# Patient Record
Sex: Female | Born: 1935 | Race: White | Hispanic: No | Marital: Married | Smoking: Never smoker
Health system: Southern US, Community
[De-identification: ages and names within clinical notes are randomized; demographics above are authoritative.]

## PROBLEM LIST (undated history)

## (undated) DIAGNOSIS — I2510 Atherosclerotic heart disease of native coronary artery without angina pectoris: Secondary | ICD-10-CM

## (undated) DIAGNOSIS — I219 Acute myocardial infarction, unspecified: Secondary | ICD-10-CM

## (undated) DIAGNOSIS — F0390 Unspecified dementia without behavioral disturbance: Secondary | ICD-10-CM

## (undated) DIAGNOSIS — I10 Essential (primary) hypertension: Secondary | ICD-10-CM

## (undated) HISTORY — PX: CORONARY STENT PLACEMENT: SHX1402

## (undated) HISTORY — PX: CHOLECYSTECTOMY: SHX55

---

## 2009-06-20 ENCOUNTER — Ambulatory Visit: Payer: Self-pay | Admitting: Family Medicine

## 2013-07-18 ENCOUNTER — Ambulatory Visit: Payer: Self-pay | Admitting: Neurology

## 2018-01-09 ENCOUNTER — Ambulatory Visit: Payer: Self-pay | Admitting: Physical Therapy

## 2018-01-17 ENCOUNTER — Ambulatory Visit: Payer: Self-pay | Admitting: Physical Therapy

## 2018-01-18 ENCOUNTER — Ambulatory Visit: Payer: Self-pay | Admitting: Physical Therapy

## 2018-01-23 ENCOUNTER — Ambulatory Visit: Payer: Self-pay | Admitting: Physical Therapy

## 2018-01-26 ENCOUNTER — Ambulatory Visit: Payer: Self-pay | Admitting: Physical Therapy

## 2018-02-06 ENCOUNTER — Ambulatory Visit: Payer: Self-pay | Admitting: Physical Therapy

## 2018-02-09 ENCOUNTER — Ambulatory Visit: Payer: Self-pay | Admitting: Physical Therapy

## 2018-02-13 ENCOUNTER — Ambulatory Visit: Payer: Self-pay | Admitting: Physical Therapy

## 2018-02-16 ENCOUNTER — Ambulatory Visit: Payer: Self-pay | Admitting: Physical Therapy

## 2019-05-14 ENCOUNTER — Ambulatory Visit: Payer: Self-pay | Admitting: Podiatry

## 2019-05-17 ENCOUNTER — Other Ambulatory Visit: Payer: Self-pay

## 2019-05-17 ENCOUNTER — Encounter: Payer: Self-pay | Admitting: Podiatry

## 2019-05-17 ENCOUNTER — Ambulatory Visit (INDEPENDENT_AMBULATORY_CARE_PROVIDER_SITE_OTHER): Payer: Medicare PPO | Admitting: Podiatry

## 2019-05-17 DIAGNOSIS — M79675 Pain in left toe(s): Secondary | ICD-10-CM

## 2019-05-17 DIAGNOSIS — B351 Tinea unguium: Secondary | ICD-10-CM | POA: Diagnosis not present

## 2019-05-17 DIAGNOSIS — D689 Coagulation defect, unspecified: Secondary | ICD-10-CM | POA: Insufficient documentation

## 2019-05-17 DIAGNOSIS — M79674 Pain in right toe(s): Secondary | ICD-10-CM

## 2019-05-17 NOTE — Progress Notes (Signed)
This patient presents to my office for at risk foot care.  This patient requires this care by a professional since this patient will be at risk due to having coagulation defect for which she takes eliquiss. This patient presents to the office with her daughter in law.  This patient is unable to cut nails herself since the patient cannot reach her nails.These nails are painful walking and wearing shoes.  This patient presents for at risk foot care today.  General Appearance  Alert, conversant and in no acute stress.  Vascular  Dorsalis pedis and posterior tibial  pulses are palpable  bilaterally.  Capillary return is within normal limits  bilaterally. Temperature is within normal limits  bilaterally.  Neurologic  Deferred due to dementia.  Nails Thick disfigured discolored nails with subungual debris  from hallux to fifth toes bilaterally. No evidence of bacterial infection or drainage bilaterally.  Orthopedic  No limitations of motion  feet .  No crepitus or effusions noted.  No bony pathology or digital deformities noted.  Skin  normotropic skin with no porokeratosis noted bilaterally.  No signs of infections or ulcers noted.     Onychomycosis  Pain in right toes  Pain in left toes  Consent was obtained for treatment procedures.   Mechanical debridement of nails 1-5  bilaterally performed with a nail nipper.  Filed with dremel without incident.    Return office visit    3 months                  Told patient to return for periodic foot care and evaluation due to potential at risk complications.   Helane Gunther DPM

## 2019-07-21 ENCOUNTER — Other Ambulatory Visit: Payer: Self-pay

## 2019-07-21 ENCOUNTER — Emergency Department: Payer: Medicare PPO

## 2019-07-21 DIAGNOSIS — I482 Chronic atrial fibrillation, unspecified: Secondary | ICD-10-CM | POA: Diagnosis present

## 2019-07-21 DIAGNOSIS — E876 Hypokalemia: Secondary | ICD-10-CM | POA: Diagnosis not present

## 2019-07-21 DIAGNOSIS — E871 Hypo-osmolality and hyponatremia: Secondary | ICD-10-CM | POA: Diagnosis present

## 2019-07-21 DIAGNOSIS — B9689 Other specified bacterial agents as the cause of diseases classified elsewhere: Secondary | ICD-10-CM | POA: Diagnosis present

## 2019-07-21 DIAGNOSIS — Z888 Allergy status to other drugs, medicaments and biological substances status: Secondary | ICD-10-CM

## 2019-07-21 DIAGNOSIS — Z9049 Acquired absence of other specified parts of digestive tract: Secondary | ICD-10-CM

## 2019-07-21 DIAGNOSIS — Z79899 Other long term (current) drug therapy: Secondary | ICD-10-CM

## 2019-07-21 DIAGNOSIS — F0150 Vascular dementia without behavioral disturbance: Secondary | ICD-10-CM | POA: Diagnosis present

## 2019-07-21 DIAGNOSIS — Z955 Presence of coronary angioplasty implant and graft: Secondary | ICD-10-CM

## 2019-07-21 DIAGNOSIS — I2510 Atherosclerotic heart disease of native coronary artery without angina pectoris: Secondary | ICD-10-CM | POA: Diagnosis present

## 2019-07-21 DIAGNOSIS — Z6830 Body mass index (BMI) 30.0-30.9, adult: Secondary | ICD-10-CM

## 2019-07-21 DIAGNOSIS — N390 Urinary tract infection, site not specified: Secondary | ICD-10-CM | POA: Diagnosis present

## 2019-07-21 DIAGNOSIS — Z91040 Latex allergy status: Secondary | ICD-10-CM

## 2019-07-21 DIAGNOSIS — E860 Dehydration: Secondary | ICD-10-CM | POA: Diagnosis present

## 2019-07-21 DIAGNOSIS — R6520 Severe sepsis without septic shock: Secondary | ICD-10-CM | POA: Diagnosis present

## 2019-07-21 DIAGNOSIS — I248 Other forms of acute ischemic heart disease: Secondary | ICD-10-CM | POA: Diagnosis present

## 2019-07-21 DIAGNOSIS — K449 Diaphragmatic hernia without obstruction or gangrene: Secondary | ICD-10-CM | POA: Diagnosis present

## 2019-07-21 DIAGNOSIS — R630 Anorexia: Secondary | ICD-10-CM | POA: Diagnosis present

## 2019-07-21 DIAGNOSIS — Z20822 Contact with and (suspected) exposure to covid-19: Secondary | ICD-10-CM | POA: Diagnosis present

## 2019-07-21 DIAGNOSIS — Z23 Encounter for immunization: Secondary | ICD-10-CM

## 2019-07-21 DIAGNOSIS — Z885 Allergy status to narcotic agent status: Secondary | ICD-10-CM

## 2019-07-21 DIAGNOSIS — I5033 Acute on chronic diastolic (congestive) heart failure: Secondary | ICD-10-CM | POA: Diagnosis present

## 2019-07-21 DIAGNOSIS — G309 Alzheimer's disease, unspecified: Secondary | ICD-10-CM | POA: Diagnosis present

## 2019-07-21 DIAGNOSIS — E663 Overweight: Secondary | ICD-10-CM | POA: Diagnosis present

## 2019-07-21 DIAGNOSIS — I252 Old myocardial infarction: Secondary | ICD-10-CM

## 2019-07-21 DIAGNOSIS — G20 Parkinson's disease: Secondary | ICD-10-CM | POA: Diagnosis present

## 2019-07-21 DIAGNOSIS — E785 Hyperlipidemia, unspecified: Secondary | ICD-10-CM | POA: Diagnosis present

## 2019-07-21 DIAGNOSIS — I080 Rheumatic disorders of both mitral and aortic valves: Secondary | ICD-10-CM | POA: Diagnosis present

## 2019-07-21 DIAGNOSIS — I447 Left bundle-branch block, unspecified: Secondary | ICD-10-CM | POA: Diagnosis present

## 2019-07-21 DIAGNOSIS — Z9119 Patient's noncompliance with other medical treatment and regimen: Secondary | ICD-10-CM

## 2019-07-21 DIAGNOSIS — E878 Other disorders of electrolyte and fluid balance, not elsewhere classified: Secondary | ICD-10-CM | POA: Diagnosis present

## 2019-07-21 DIAGNOSIS — I110 Hypertensive heart disease with heart failure: Secondary | ICD-10-CM | POA: Diagnosis present

## 2019-07-21 DIAGNOSIS — A4151 Sepsis due to Escherichia coli [E. coli]: Secondary | ICD-10-CM | POA: Diagnosis not present

## 2019-07-21 DIAGNOSIS — G9341 Metabolic encephalopathy: Secondary | ICD-10-CM | POA: Diagnosis present

## 2019-07-21 DIAGNOSIS — D500 Iron deficiency anemia secondary to blood loss (chronic): Secondary | ICD-10-CM | POA: Diagnosis present

## 2019-07-21 DIAGNOSIS — F0280 Dementia in other diseases classified elsewhere without behavioral disturbance: Secondary | ICD-10-CM | POA: Diagnosis present

## 2019-07-21 DIAGNOSIS — N179 Acute kidney failure, unspecified: Secondary | ICD-10-CM | POA: Diagnosis present

## 2019-07-21 DIAGNOSIS — Z7901 Long term (current) use of anticoagulants: Secondary | ICD-10-CM

## 2019-07-21 DIAGNOSIS — I700 Atherosclerosis of aorta: Secondary | ICD-10-CM | POA: Diagnosis present

## 2019-07-21 DIAGNOSIS — D689 Coagulation defect, unspecified: Secondary | ICD-10-CM | POA: Diagnosis present

## 2019-07-21 LAB — COMPREHENSIVE METABOLIC PANEL
ALT: 28 U/L (ref 0–44)
AST: 38 U/L (ref 15–41)
Albumin: 3.4 g/dL — ABNORMAL LOW (ref 3.5–5.0)
Alkaline Phosphatase: 126 U/L (ref 38–126)
Anion gap: 13 (ref 5–15)
BUN: 18 mg/dL (ref 8–23)
CO2: 23 mmol/L (ref 22–32)
Calcium: 8.7 mg/dL — ABNORMAL LOW (ref 8.9–10.3)
Chloride: 94 mmol/L — ABNORMAL LOW (ref 98–111)
Creatinine, Ser: 1.14 mg/dL — ABNORMAL HIGH (ref 0.44–1.00)
GFR calc Af Amer: 50 mL/min — ABNORMAL LOW (ref >60)
GFR calc non Af Amer: 44 mL/min — ABNORMAL LOW (ref >60)
Glucose, Bld: 223 mg/dL — ABNORMAL HIGH (ref 70–99)
Potassium: 2.8 mmol/L — ABNORMAL LOW (ref 3.5–5.1)
Sodium: 130 mmol/L — ABNORMAL LOW (ref 135–145)
Total Bilirubin: 1.2 mg/dL (ref 0.3–1.2)
Total Protein: 7.3 g/dL (ref 6.5–8.1)

## 2019-07-21 LAB — CBC WITH DIFFERENTIAL/PLATELET
Abs Immature Granulocytes: 0.19 10*3/uL — ABNORMAL HIGH (ref 0.00–0.07)
Basophils Absolute: 0.1 10*3/uL (ref 0.0–0.1)
Basophils Relative: 0 %
Eosinophils Absolute: 0 10*3/uL (ref 0.0–0.5)
Eosinophils Relative: 0 %
HCT: 40.6 % (ref 36.0–46.0)
Hemoglobin: 13.8 g/dL (ref 12.0–15.0)
Immature Granulocytes: 1 %
Lymphocytes Relative: 2 %
Lymphs Abs: 0.4 10*3/uL — ABNORMAL LOW (ref 0.7–4.0)
MCH: 31.2 pg (ref 26.0–34.0)
MCHC: 34 g/dL (ref 30.0–36.0)
MCV: 91.9 fL (ref 80.0–100.0)
Monocytes Absolute: 0.9 10*3/uL (ref 0.1–1.0)
Monocytes Relative: 4 %
Neutro Abs: 23.3 10*3/uL — ABNORMAL HIGH (ref 1.7–7.7)
Neutrophils Relative %: 93 %
Platelets: 157 10*3/uL (ref 150–400)
RBC: 4.42 MIL/uL (ref 3.87–5.11)
RDW: 13.9 % (ref 11.5–15.5)
Smear Review: NORMAL
WBC: 25.1 10*3/uL — ABNORMAL HIGH (ref 4.0–10.5)
nRBC: 0 % (ref 0.0–0.2)

## 2019-07-21 LAB — LACTIC ACID, PLASMA
Lactic Acid, Venous: 3.8 mmol/L (ref 0.5–1.9)
Lactic Acid, Venous: 3.7 mmol/L (ref 0.5–1.9)

## 2019-07-21 LAB — TROPONIN I (HIGH SENSITIVITY): Troponin I (High Sensitivity): 60 ng/L — ABNORMAL HIGH (ref <18)

## 2019-07-21 MED ORDER — SODIUM CHLORIDE 0.9 % IV BOLUS
1000.0000 mL | Freq: Once | INTRAVENOUS | Status: AC
  Administered 2019-07-21: 1000 mL via INTRAVENOUS

## 2019-07-21 NOTE — ED Triage Notes (Signed)
Patient coming in POV from home with son for multiple complaints. Patient has hx of dementia. Patient has not been eating, increased weakness, increased confusion beginning yesterday.

## 2019-07-22 ENCOUNTER — Inpatient Hospital Stay
Admission: EM | Admit: 2019-07-22 | Discharge: 2019-08-10 | DRG: 871 | Disposition: A | Discharge status: Skilled Nursing Facility | Payer: Medicare PPO | Attending: Internal Medicine | Admitting: Internal Medicine

## 2019-07-22 ENCOUNTER — Inpatient Hospital Stay: Payer: Medicare PPO

## 2019-07-22 ENCOUNTER — Encounter: Payer: Self-pay | Admitting: Family Medicine

## 2019-07-22 ENCOUNTER — Emergency Department: Payer: Medicare PPO

## 2019-07-22 DIAGNOSIS — N390 Urinary tract infection, site not specified: Secondary | ICD-10-CM | POA: Diagnosis present

## 2019-07-22 DIAGNOSIS — E876 Hypokalemia: Secondary | ICD-10-CM

## 2019-07-22 DIAGNOSIS — I248 Other forms of acute ischemic heart disease: Secondary | ICD-10-CM

## 2019-07-22 DIAGNOSIS — G20 Parkinson's disease: Secondary | ICD-10-CM | POA: Diagnosis not present

## 2019-07-22 DIAGNOSIS — I2510 Atherosclerotic heart disease of native coronary artery without angina pectoris: Secondary | ICD-10-CM | POA: Diagnosis present

## 2019-07-22 DIAGNOSIS — N179 Acute kidney failure, unspecified: Secondary | ICD-10-CM

## 2019-07-22 DIAGNOSIS — D649 Anemia, unspecified: Secondary | ICD-10-CM | POA: Diagnosis not present

## 2019-07-22 DIAGNOSIS — G309 Alzheimer's disease, unspecified: Secondary | ICD-10-CM | POA: Diagnosis present

## 2019-07-22 DIAGNOSIS — I110 Hypertensive heart disease with heart failure: Secondary | ICD-10-CM | POA: Diagnosis present

## 2019-07-22 DIAGNOSIS — I5033 Acute on chronic diastolic (congestive) heart failure: Secondary | ICD-10-CM | POA: Diagnosis present

## 2019-07-22 DIAGNOSIS — A4150 Gram-negative sepsis, unspecified: Secondary | ICD-10-CM | POA: Diagnosis present

## 2019-07-22 DIAGNOSIS — Z20822 Contact with and (suspected) exposure to covid-19: Secondary | ICD-10-CM | POA: Diagnosis present

## 2019-07-22 DIAGNOSIS — D62 Acute posthemorrhagic anemia: Secondary | ICD-10-CM | POA: Diagnosis not present

## 2019-07-22 DIAGNOSIS — D500 Iron deficiency anemia secondary to blood loss (chronic): Secondary | ICD-10-CM | POA: Diagnosis present

## 2019-07-22 DIAGNOSIS — A4151 Sepsis due to Escherichia coli [E. coli]: Secondary | ICD-10-CM | POA: Diagnosis present

## 2019-07-22 DIAGNOSIS — I4891 Unspecified atrial fibrillation: Secondary | ICD-10-CM | POA: Diagnosis not present

## 2019-07-22 DIAGNOSIS — D689 Coagulation defect, unspecified: Secondary | ICD-10-CM | POA: Diagnosis present

## 2019-07-22 DIAGNOSIS — G9341 Metabolic encephalopathy: Secondary | ICD-10-CM | POA: Diagnosis present

## 2019-07-22 DIAGNOSIS — I5043 Acute on chronic combined systolic (congestive) and diastolic (congestive) heart failure: Secondary | ICD-10-CM | POA: Diagnosis not present

## 2019-07-22 DIAGNOSIS — E860 Dehydration: Secondary | ICD-10-CM | POA: Diagnosis present

## 2019-07-22 DIAGNOSIS — R0600 Dyspnea, unspecified: Secondary | ICD-10-CM | POA: Diagnosis not present

## 2019-07-22 DIAGNOSIS — I10 Essential (primary) hypertension: Secondary | ICD-10-CM | POA: Diagnosis present

## 2019-07-22 DIAGNOSIS — F0280 Dementia in other diseases classified elsewhere without behavioral disturbance: Secondary | ICD-10-CM | POA: Diagnosis present

## 2019-07-22 DIAGNOSIS — R531 Weakness: Secondary | ICD-10-CM | POA: Diagnosis not present

## 2019-07-22 DIAGNOSIS — E871 Hypo-osmolality and hyponatremia: Secondary | ICD-10-CM

## 2019-07-22 DIAGNOSIS — R6520 Severe sepsis without septic shock: Secondary | ICD-10-CM | POA: Diagnosis not present

## 2019-07-22 DIAGNOSIS — B9689 Other specified bacterial agents as the cause of diseases classified elsewhere: Secondary | ICD-10-CM | POA: Diagnosis present

## 2019-07-22 DIAGNOSIS — R0602 Shortness of breath: Secondary | ICD-10-CM | POA: Diagnosis not present

## 2019-07-22 DIAGNOSIS — G20A1 Parkinson's disease without dyskinesia, without mention of fluctuations: Secondary | ICD-10-CM | POA: Diagnosis present

## 2019-07-22 DIAGNOSIS — K449 Diaphragmatic hernia without obstruction or gangrene: Secondary | ICD-10-CM | POA: Diagnosis present

## 2019-07-22 DIAGNOSIS — E785 Hyperlipidemia, unspecified: Secondary | ICD-10-CM | POA: Diagnosis present

## 2019-07-22 DIAGNOSIS — I2489 Other forms of acute ischemic heart disease: Secondary | ICD-10-CM

## 2019-07-22 DIAGNOSIS — B9620 Unspecified Escherichia coli [E. coli] as the cause of diseases classified elsewhere: Secondary | ICD-10-CM | POA: Diagnosis not present

## 2019-07-22 DIAGNOSIS — D638 Anemia in other chronic diseases classified elsewhere: Secondary | ICD-10-CM | POA: Diagnosis not present

## 2019-07-22 DIAGNOSIS — A419 Sepsis, unspecified organism: Secondary | ICD-10-CM

## 2019-07-22 DIAGNOSIS — E878 Other disorders of electrolyte and fluid balance, not elsewhere classified: Secondary | ICD-10-CM | POA: Diagnosis present

## 2019-07-22 DIAGNOSIS — Z23 Encounter for immunization: Secondary | ICD-10-CM | POA: Diagnosis present

## 2019-07-22 DIAGNOSIS — D509 Iron deficiency anemia, unspecified: Secondary | ICD-10-CM | POA: Diagnosis not present

## 2019-07-22 DIAGNOSIS — I482 Chronic atrial fibrillation, unspecified: Secondary | ICD-10-CM | POA: Diagnosis present

## 2019-07-22 DIAGNOSIS — R059 Cough, unspecified: Secondary | ICD-10-CM

## 2019-07-22 HISTORY — DX: Acute myocardial infarction, unspecified: I21.9

## 2019-07-22 HISTORY — DX: Essential (primary) hypertension: I10

## 2019-07-22 HISTORY — DX: Atherosclerotic heart disease of native coronary artery without angina pectoris: I25.10

## 2019-07-22 HISTORY — DX: Unspecified dementia, unspecified severity, without behavioral disturbance, psychotic disturbance, mood disturbance, and anxiety: F03.90

## 2019-07-22 LAB — URINALYSIS, COMPLETE (UACMP) WITH MICROSCOPIC
Bilirubin Urine: NEGATIVE
Glucose, UA: NEGATIVE mg/dL
Ketones, ur: NEGATIVE mg/dL
Nitrite: POSITIVE — AB
Protein, ur: NEGATIVE mg/dL
Specific Gravity, Urine: 1.012 (ref 1.005–1.030)
pH: 5 (ref 5.0–8.0)

## 2019-07-22 LAB — LACTIC ACID, PLASMA
Lactic Acid, Venous: 2.2 mmol/L (ref 0.5–1.9)
Lactic Acid, Venous: 2.1 mmol/L (ref 0.5–1.9)

## 2019-07-22 LAB — CBC WITH DIFFERENTIAL/PLATELET
Abs Immature Granulocytes: 0.06 10*3/uL (ref 0.00–0.07)
Basophils Absolute: 0.1 10*3/uL (ref 0.0–0.1)
Basophils Relative: 0 %
Eosinophils Absolute: 0.1 10*3/uL (ref 0.0–0.5)
Eosinophils Relative: 0 %
HCT: 35.9 % — ABNORMAL LOW (ref 36.0–46.0)
Hemoglobin: 12.3 g/dL (ref 12.0–15.0)
Immature Granulocytes: 0 %
Lymphocytes Relative: 6 %
Lymphs Abs: 0.9 10*3/uL (ref 0.7–4.0)
MCH: 30.6 pg (ref 26.0–34.0)
MCHC: 34.3 g/dL (ref 30.0–36.0)
MCV: 89.3 fL (ref 80.0–100.0)
Monocytes Absolute: 0.4 10*3/uL (ref 0.1–1.0)
Monocytes Relative: 3 %
Neutro Abs: 14.2 10*3/uL — ABNORMAL HIGH (ref 1.7–7.7)
Neutrophils Relative %: 91 %
Platelets: 134 10*3/uL — ABNORMAL LOW (ref 150–400)
RBC: 4.02 MIL/uL (ref 3.87–5.11)
RDW: 13.9 % (ref 11.5–15.5)
WBC: 15.7 10*3/uL — ABNORMAL HIGH (ref 4.0–10.5)
nRBC: 0 % (ref 0.0–0.2)

## 2019-07-22 LAB — SARS CORONAVIRUS 2 BY RT PCR (HOSPITAL ORDER, PERFORMED IN ~~LOC~~ HOSPITAL LAB): SARS Coronavirus 2: NEGATIVE

## 2019-07-22 LAB — BASIC METABOLIC PANEL
Anion gap: 11 (ref 5–15)
Anion gap: 8 (ref 5–15)
BUN: 18 mg/dL (ref 8–23)
BUN: 17 mg/dL (ref 8–23)
CO2: 24 mmol/L (ref 22–32)
CO2: 25 mmol/L (ref 22–32)
Calcium: 8.2 mg/dL — ABNORMAL LOW (ref 8.9–10.3)
Calcium: 8.3 mg/dL — ABNORMAL LOW (ref 8.9–10.3)
Chloride: 99 mmol/L (ref 98–111)
Chloride: 101 mmol/L (ref 98–111)
Creatinine, Ser: 0.92 mg/dL (ref 0.44–1.00)
Creatinine, Ser: 0.79 mg/dL (ref 0.44–1.00)
GFR calc Af Amer: >60 mL/min (ref >60)
GFR calc Af Amer: >60 mL/min (ref >60)
GFR calc non Af Amer: 56 mL/min — ABNORMAL LOW (ref >60)
GFR calc non Af Amer: >60 mL/min (ref >60)
Glucose, Bld: 119 mg/dL — ABNORMAL HIGH (ref 70–99)
Glucose, Bld: 121 mg/dL — ABNORMAL HIGH (ref 70–99)
Potassium: 3.4 mmol/L — ABNORMAL LOW (ref 3.5–5.1)
Potassium: 3.5 mmol/L (ref 3.5–5.1)
Sodium: 134 mmol/L — ABNORMAL LOW (ref 135–145)
Sodium: 134 mmol/L — ABNORMAL LOW (ref 135–145)

## 2019-07-22 LAB — BLOOD GAS, ARTERIAL
Acid-Base Excess: 1.4 mmol/L (ref 0.0–2.0)
Bicarbonate: 24.7 mmol/L (ref 20.0–28.0)
O2 Saturation: 97 %
Patient temperature: 37
pCO2 arterial: 34 mmHg (ref 32.0–48.0)
pH, Arterial: 7.47 — ABNORMAL HIGH (ref 7.350–7.450)
pO2, Arterial: 85 mmHg (ref 83.0–108.0)

## 2019-07-22 LAB — PROCALCITONIN
Procalcitonin: 1.21 ng/mL
Procalcitonin: 1.11 ng/mL
Procalcitonin: 0.95 ng/mL

## 2019-07-22 LAB — MAGNESIUM
Magnesium: 1.7 mg/dL (ref 1.7–2.4)
Magnesium: 1.6 mg/dL — ABNORMAL LOW (ref 1.7–2.4)

## 2019-07-22 LAB — PROTIME-INR
INR: 1.6 — ABNORMAL HIGH (ref 0.8–1.2)
INR: 1.5 — ABNORMAL HIGH (ref 0.8–1.2)
Prothrombin Time: 18.6 seconds — ABNORMAL HIGH (ref 11.4–15.2)
Prothrombin Time: 17.5 seconds — ABNORMAL HIGH (ref 11.4–15.2)

## 2019-07-22 LAB — TROPONIN I (HIGH SENSITIVITY): Troponin I (High Sensitivity): 64 ng/L — ABNORMAL HIGH (ref <18)

## 2019-07-22 LAB — CORTISOL-AM, BLOOD: Cortisol - AM: 20.9 ug/dL (ref 6.7–22.6)

## 2019-07-22 MED ORDER — ONDANSETRON HCL 4 MG PO TABS: 4.0000 mg | ORAL_TABLET | Freq: Four times a day (QID) | ORAL | Status: DC | PRN

## 2019-07-22 MED ORDER — PNEUMOCOCCAL VAC POLYVALENT 25 MCG/0.5ML IJ INJ
0.5000 mL | INJECTION | INTRAMUSCULAR | Status: AC
  Administered 2019-07-25: 0.5 mL via INTRAMUSCULAR
  Filled 2019-07-22: qty 0.5

## 2019-07-22 MED ORDER — POTASSIUM CHLORIDE 20 MEQ PO PACK
40.0000 meq | PACK | Freq: Once | ORAL | Status: DC
  Filled 2019-07-22 (×2): qty 2

## 2019-07-22 MED ORDER — TRAZODONE HCL 50 MG PO TABS
25.0000 mg | ORAL_TABLET | Freq: Every evening | ORAL | Status: DC | PRN
  Administered 2019-07-22 – 2019-08-09 (×18): 25 mg via ORAL
  Filled 2019-07-22 (×17): qty 1

## 2019-07-22 MED ORDER — NITROGLYCERIN 0.4 MG SL SUBL: 0.4000 mg | SUBLINGUAL_TABLET | SUBLINGUAL | Status: DC | PRN

## 2019-07-22 MED ORDER — DONEPEZIL HCL 5 MG PO TABS
10.0000 mg | ORAL_TABLET | Freq: Every day | ORAL | Status: DC
  Administered 2019-07-22 – 2019-08-09 (×19): 10 mg via ORAL
  Filled 2019-07-22 (×20): qty 2

## 2019-07-22 MED ORDER — ADULT MULTIVITAMIN W/MINERALS CH
1.0000 | ORAL_TABLET | Freq: Every day | ORAL | Status: DC
  Administered 2019-07-22 – 2019-08-10 (×20): 1 via ORAL
  Filled 2019-07-22 (×21): qty 1

## 2019-07-22 MED ORDER — SODIUM CHLORIDE 0.9 % IV SOLN
1.0000 g | Freq: Once | INTRAVENOUS | Status: AC
  Administered 2019-07-22: 1 g via INTRAVENOUS
  Filled 2019-07-22: qty 1

## 2019-07-22 MED ORDER — LACTATED RINGERS IV BOLUS: 2000.0000 mL | Freq: Once | INTRAVENOUS | Status: DC

## 2019-07-22 MED ORDER — POTASSIUM CHLORIDE CRYS ER 20 MEQ PO TBCR
40.0000 meq | EXTENDED_RELEASE_TABLET | Freq: Once | ORAL | Status: AC
  Administered 2019-07-22: 40 meq via ORAL
  Filled 2019-07-22: qty 2

## 2019-07-22 MED ORDER — ACETAMINOPHEN 325 MG PO TABS
650.0000 mg | ORAL_TABLET | Freq: Four times a day (QID) | ORAL | Status: DC | PRN
  Administered 2019-07-24 – 2019-07-25 (×2): 650 mg via ORAL
  Filled 2019-07-22 (×2): qty 2

## 2019-07-22 MED ORDER — ATORVASTATIN CALCIUM 80 MG PO TABS
80.0000 mg | ORAL_TABLET | Freq: Every day | ORAL | Status: DC
  Administered 2019-07-22 – 2019-08-10 (×20): 80 mg via ORAL
  Filled 2019-07-22 (×20): qty 1

## 2019-07-22 MED ORDER — POTASSIUM CHLORIDE 20 MEQ PO PACK
40.0000 meq | PACK | Freq: Once | ORAL | Status: AC
  Administered 2019-07-22: 40 meq via ORAL

## 2019-07-22 MED ORDER — POTASSIUM CHLORIDE CRYS ER 20 MEQ PO TBCR
20.0000 meq | EXTENDED_RELEASE_TABLET | Freq: Every day | ORAL | Status: DC
  Administered 2019-07-22 – 2019-08-03 (×13): 20 meq via ORAL
  Filled 2019-07-22 (×13): qty 1

## 2019-07-22 MED ORDER — MAGNESIUM SULFATE 50 % IJ SOLN
3.0000 g | Freq: Once | INTRAVENOUS | Status: AC
  Administered 2019-07-22: 3 g via INTRAVENOUS
  Filled 2019-07-22: qty 4

## 2019-07-22 MED ORDER — LACTATED RINGERS IV BOLUS
1000.0000 mL | Freq: Once | INTRAVENOUS | Status: AC
  Administered 2019-07-22: 1000 mL via INTRAVENOUS

## 2019-07-22 MED ORDER — VANCOMYCIN HCL IN DEXTROSE 1-5 GM/200ML-% IV SOLN
1000.0000 mg | Freq: Once | INTRAVENOUS | Status: AC
  Administered 2019-07-22: 1000 mg via INTRAVENOUS
  Filled 2019-07-22: qty 200

## 2019-07-22 MED ORDER — FERROUS SULFATE 325 (65 FE) MG PO TABS
325.0000 mg | ORAL_TABLET | Freq: Every day | ORAL | Status: DC
  Administered 2019-07-22 – 2019-07-28 (×7): 325 mg via ORAL
  Filled 2019-07-22 (×7): qty 1

## 2019-07-22 MED ORDER — SERTRALINE HCL 50 MG PO TABS
50.0000 mg | ORAL_TABLET | Freq: Every day | ORAL | Status: DC
  Administered 2019-07-22: 50 mg via ORAL
  Filled 2019-07-22: qty 1

## 2019-07-22 MED ORDER — MAGNESIUM HYDROXIDE 400 MG/5ML PO SUSP
30.0000 mL | Freq: Every day | ORAL | Status: DC | PRN
  Filled 2019-07-22: qty 30

## 2019-07-22 MED ORDER — POTASSIUM CHLORIDE 10 MEQ/100ML IV SOLN
10.0000 meq | INTRAVENOUS | Status: AC
  Administered 2019-07-22: 10 meq via INTRAVENOUS
  Filled 2019-07-22: qty 100

## 2019-07-22 MED ORDER — SODIUM CHLORIDE 0.9 % IV SOLN: INTRAVENOUS | Status: DC

## 2019-07-22 MED ORDER — ONDANSETRON HCL 4 MG/2ML IJ SOLN
4.0000 mg | Freq: Four times a day (QID) | INTRAMUSCULAR | Status: DC | PRN
  Administered 2019-08-02: 4 mg via INTRAVENOUS
  Filled 2019-07-22: qty 2

## 2019-07-22 MED ORDER — POTASSIUM CHLORIDE 10 MEQ/100ML IV SOLN: 10.0000 meq | INTRAVENOUS | Status: DC

## 2019-07-22 MED ORDER — MEGESTROL ACETATE 400 MG/10ML PO SUSP
400.0000 mg | Freq: Every day | ORAL | Status: DC
  Administered 2019-07-22 – 2019-08-10 (×19): 400 mg via ORAL
  Filled 2019-07-22 (×20): qty 10

## 2019-07-22 MED ORDER — MEMANTINE HCL 10 MG PO TABS
10.0000 mg | ORAL_TABLET | Freq: Every day | ORAL | Status: DC
  Administered 2019-07-22 – 2019-08-10 (×20): 10 mg via ORAL
  Filled 2019-07-22 (×20): qty 1

## 2019-07-22 MED ORDER — ACETAMINOPHEN 650 MG RE SUPP: 650.0000 mg | Freq: Four times a day (QID) | RECTAL | Status: DC | PRN

## 2019-07-22 MED ORDER — APIXABAN 5 MG PO TABS
5.0000 mg | ORAL_TABLET | Freq: Two times a day (BID) | ORAL | Status: DC
  Administered 2019-07-22 – 2019-07-26 (×10): 5 mg via ORAL
  Filled 2019-07-22 (×10): qty 1

## 2019-07-22 MED ORDER — ACETAMINOPHEN 500 MG PO TABS
1000.0000 mg | ORAL_TABLET | Freq: Once | ORAL | Status: AC
  Administered 2019-07-22: 1000 mg via ORAL
  Filled 2019-07-22: qty 2

## 2019-07-22 MED ORDER — DILTIAZEM HCL ER COATED BEADS 120 MG PO CP24
240.0000 mg | ORAL_CAPSULE | Freq: Every day | ORAL | Status: DC
  Administered 2019-07-22 – 2019-08-10 (×20): 240 mg via ORAL
  Filled 2019-07-22 (×2): qty 1
  Filled 2019-07-22: qty 2
  Filled 2019-07-22: qty 1
  Filled 2019-07-22: qty 2
  Filled 2019-07-22: qty 1
  Filled 2019-07-22 (×2): qty 2
  Filled 2019-07-22 (×2): qty 1
  Filled 2019-07-22 (×5): qty 2
  Filled 2019-07-22: qty 1
  Filled 2019-07-22 (×4): qty 2
  Filled 2019-07-22: qty 1
  Filled 2019-07-22: qty 2
  Filled 2019-07-22: qty 1
  Filled 2019-07-22 (×4): qty 2
  Filled 2019-07-22: qty 1
  Filled 2019-07-22: qty 2

## 2019-07-22 MED ORDER — LACTATED RINGERS IV BOLUS: 1000.0000 mL | Freq: Once | INTRAVENOUS | Status: DC

## 2019-07-22 MED ORDER — SODIUM CHLORIDE 0.9 % IV SOLN
1.0000 g | INTRAVENOUS | Status: DC
  Administered 2019-07-22 – 2019-07-25 (×4): 1 g via INTRAVENOUS
  Filled 2019-07-22: qty 1
  Filled 2019-07-22: qty 10
  Filled 2019-07-22: qty 1
  Filled 2019-07-22: qty 10
  Filled 2019-07-22: qty 1

## 2019-07-22 NOTE — Progress Notes (Signed)
CODE SEPSIS - PHARMACY COMMUNICATION  **Broad Spectrum Antibiotics should be administered within 1 hour of Sepsis diagnosis**  Time Code Sepsis Called/Page Received: 0039  Antibiotics Ordered: Cefepime  Time of 1st antibiotic administration: 0109  Additional action taken by pharmacy: n/a  If necessary, Name of Provider/Nurse Contacted: n/a    Wayland Denis ,PharmD Clinical Pharmacist  07/22/2019  1:34 AM

## 2019-07-22 NOTE — Progress Notes (Addendum)
Patient has removed her PIV, despite it having been wrapped on previous shift. E. Ouma notified; order placed to trial telesitter as pt appears to be redirectable.   Patient was also given trazadone at 9:30 pm.   0100 Update: Patient awoke after about 2 hours extremely confused and forgetful. Telesitter is  ineffective due to patient not being able to hear well enough to understand the telesitter, and also pt does not understand the concept. She is afraid of the voices and not seeing a person. 1:1 safety sitter placed at bedside and covering APP, E. Ouma notified. Patient also has a new IV, IVF restarted.

## 2019-07-22 NOTE — ED Provider Notes (Addendum)
[facility] Emergency Department Provider Note  ____________________________________________  Time seen: Approximately 12:59 AM  I have reviewed the triage vital signs and the nursing notes.   HISTORY  Chief Complaint Altered Mental Status  Level 5 caveat:  Portions of the history and physical were unable to be obtained due to dementia   HPI Kimberly Wang is a 86 y.o. female history of dementia, CAD, hypertension who presents for evaluation of altered mental status. Patient is from home with her husband. She lives independently. Over the last 2 days has not been eating or drinking well, has been more confused than baseline. Son thinks that she might have vomited earlier today. Patient has no complaints and denies fatigue, fever, chest pain, shortness of breath, abdominal pain, nausea, vomiting, diarrhea, dysuria or hematuria. His son thinks that her abdomen might be more distended than normal.   Past Medical History:  Diagnosis Date  . Coronary artery disease   . Dementia (HCC)   . Hypertension   . Myocardial infarct [facility])     Patient Active Problem List   Diagnosis Date Noted  . Pain due to onychomycosis of toenails of both feet 05/17/2019  . Coagulation defect (HCC) 05/17/2019    Past Surgical History:  Procedure Laterality Date  . CHOLECYSTECTOMY    . CORONARY STENT PLACEMENT      Prior to Admission medications   Medication Sig Start Date End Date Taking? Authorizing Provider  atorvastatin (LIPITOR) 80 MG tablet Take by mouth. 11/21/18   [provider]  diltiazem (CARDIZEM CD) 240 MG 24 hr capsule  03/28/19   [provider]  donepezil (ARICEPT) 10 MG tablet  03/28/19   [provider]  ELIQUIS 5 MG TABS tablet  04/15/19   [provider]  FEROSUL 325 (65 Fe) MG tablet  03/28/19   [provider]  furosemide (LASIX) 80 MG tablet  03/28/19   [provider]  memantine (NAMENDA) 10 MG tablet  03/28/19    [provider]  Multiple Vitamin (MULTIVITAMIN) capsule Take by mouth.    [provider]  nitroGLYCERIN (NITROSTAT) 0.4 MG SL tablet Place under the tongue. 11/21/18   [provider]  potassium chloride SA (KLOR-CON) 20 MEQ tablet Take by mouth. 11/21/18   [provider]  sertraline (ZOLOFT) 50 MG tablet  04/15/19   [provider]  spironolactone (ALDACTONE) 25 MG tablet Take by mouth. 11/21/18   [provider]    Allergies Latex, Lidocaine hcl, Codeine, Morphine, Novocain [procaine], and Other  No family history on file.  Social History Social History   Tobacco Use  . Smoking status: Never Smoker  . Smokeless tobacco: Never Used  Substance Use Topics  . Alcohol use: Never  . Drug use: Never    Review of Systems  Constitutional: + fever, confusion Eyes: Negative for visual changes. ENT: Negative for sore throat. Neck: No neck pain  Cardiovascular: Negative for chest pain. Respiratory: Negative for shortness of breath. Gastrointestinal: Negative for abdominal pain, vomiting or diarrhea. Genitourinary: Negative for dysuria. Musculoskeletal: Negative for back pain. Skin: Negative for rash. Neurological: Negative for headaches, weakness or numbness. Psych: No SI or HI  ____________________________________________   PHYSICAL EXAM:  VITAL SIGNS: ED Triage Vitals  Enc Vitals Group     BP 07/21/19 2135 129/75     Pulse Rate 07/21/19 2135 (!) 101     Resp 07/21/19 2135 19     Temp 07/21/19 2135 100.2 F (37.9 C)  Temp Source 07/21/19 2135 Oral     SpO2 07/21/19 2135 94 %     Weight 07/21/19 2136 160 lb (72.6 kg)     Height 07/21/19 2136 5\' 4"  (1.626 m)     Head Circumference --      Peak Flow --      Pain Score 07/21/19 2136 0     Pain Loc --      Pain Edu? --      Excl. in [place]? --     Constitutional: Alert and oriented x2. Well appearing and in no apparent distress. HEENT:      Head: Normocephalic and  atraumatic.         Eyes: Conjunctivae are normal. Sclera is non-icteric.       Mouth/Throat: Mucous membranes are moist.       Neck: Supple with no signs of meningismus. Cardiovascular: Regular rate and rhythm. No murmurs, gallops, or rubs.  Respiratory: Normal respiratory effort. Lungs are clear to auscultation bilaterally. No wheezes, crackles, or rhonchi.  Gastrointestinal: Soft,mild diffuse tenderness, and non distended with positive bowel sounds. No rebound or guarding. Genitourinary: No CVA tenderness. Musculoskeletal: No edema, cyanosis, or erythema of extremities. Neurologic: Normal speech and language. Face is symmetric. Moving all extremities. No gross focal neurologic deficits are appreciated. Skin: Skin is warm, dry and intact. No rash noted. Psychiatric: Mood and affect are normal. Speech and behavior are normal.  ____________________________________________   LABS (all labs ordered are listed, but only abnormal results are displayed)  Labs Reviewed  COMPREHENSIVE METABOLIC PANEL - Abnormal; Notable for the following components:      Result Value   Sodium 130 (*)    Potassium 2.8 (*)    Chloride 94 (*)    Glucose, Bld 223 (*)    Creatinine, Ser 1.14 (*)    Calcium 8.7 (*)    Albumin 3.4 (*)    GFR calc non Af Amer 44 (*)    GFR calc Af Amer 50 (*)    All other components within normal limits  LACTIC ACID, PLASMA - Abnormal; Notable for the following components:   Lactic Acid, Venous 3.8 (*)    All other components within normal limits  LACTIC ACID, PLASMA - Abnormal; Notable for the following components:   Lactic Acid, Venous 3.7 (*)    All other components within normal limits  CBC WITH DIFFERENTIAL/PLATELET - Abnormal; Notable for the following components:   WBC 25.1 (*)    Neutro Abs 23.3 (*)    Lymphs Abs 0.4 (*)    Abs Immature Granulocytes 0.19 (*)    All other components within normal limits  URINALYSIS, COMPLETE (UACMP) WITH MICROSCOPIC - Abnormal;  Notable for the following components:   Color, Urine YELLOW (*)    APPearance HAZY (*)    Hgb urine dipstick MODERATE (*)    Nitrite POSITIVE (*)    Leukocytes,Ua TRACE (*)    Bacteria, UA MANY (*)    All other components within normal limits  TROPONIN I (HIGH SENSITIVITY) - Abnormal; Notable for the following components:   Troponin I (High Sensitivity) 60 (*)    All other components within normal limits  CULTURE, BLOOD (ROUTINE X 2)  CULTURE, BLOOD (ROUTINE X 2)  SARS CORONAVIRUS 2 BY RT PCR (HOSPITAL ORDER, [facility])  PROTIME-INR  MAGNESIUM  PROCALCITONIN  TROPONIN I (HIGH SENSITIVITY)   ____________________________________________  EKG  ED ECG REPORT I, Rudene Re, the attending physician, personally viewed and  interpreted this ECG.  Atrial fibrillation with ventricular rate of 106, left bundle branch block, normal QTC, normal axis, no ST elevations. Left bundle branch block is new when compared to prior from 2011. ____________________________________________  RADIOLOGY  I have personally reviewed the images performed during this visit and I agree with the Radiologist's read.   Interpretation by Radiologist:  CT ABDOMEN PELVIS WO CONTRAST  Result Date: 07/22/2019 CLINICAL DATA:  Sepsis EXAM: CT ABDOMEN AND PELVIS WITHOUT CONTRAST TECHNIQUE: Multidetector CT imaging of the abdomen and pelvis was performed following the standard protocol without IV contrast. COMPARISON:  None. FINDINGS: Lower chest: There is mild cardiomegaly. Mitral valve calcifications. A moderate paraesophageal hernia containing contrast is seen. Hepatobiliary: Although limited due to the lack of intravenous contrast, normal in appearance without gross focal abnormality. The patient is status post cholecystectomy. No biliary ductal dilation. Pancreas:  Unremarkable.  No surrounding inflammatory changes. Spleen: Normal in size. Although limited due to the lack of intravenous  contrast, normal in appearance. Adrenals/Urinary Tract: Both adrenal glands appear normal. The kidneys and collecting system appear normal without evidence of urinary tract calculus or hydronephrosis. Bladder is unremarkable. Stomach/Bowel: The stomach, small bowel, are normal in appearance. There is scattered colonic diverticula without diverticulitis. Vascular/Lymphatic: There are no enlarged abdominal or pelvic lymph nodes. Scattered aortic atherosclerotic calcifications are seen without aneurysmal dilatation. Reproductive: The uterus and adnexa are unremarkable. Other: A small fat and transverse colon containing umbilical hernia seen within the mid abdomen. Musculoskeletal: No acute or significant osseous findings. IMPRESSION: Moderate paraesophageal hernia. Diverticulosis without diverticulitis. No other acute intra-abdominal or pelvic pathology to explain the patient's symptoms. Aortic Atherosclerosis (ICD10-I70.0). Electronically Signed   By: Jonna Clark M.D.   On: 07/22/2019 01:40   DG Chest 1 View  Result Date: 07/21/2019 CLINICAL DATA:  Sepsis. EXAM: CHEST  1 VIEW COMPARISON:  None. FINDINGS: There are small bilateral pleural effusions. There is a left perihilar density of unknown clinical significance. The heart size is mildly enlarged. There are dense aortic calcifications. There are few streaky airspace opacities at the lung bases favored to represent areas of atelectasis. There is no pneumothorax. IMPRESSION: 1. Small bilateral pleural effusions. 2. Mild cardiomegaly. 3. Slightly prominent left perihilar region may be secondary to a dilated pulmonary artery or lymphadenopathy. A follow-up two-view chest x-ray is recommended in 4-6 weeks. Electronically Signed   By: Katherine Mantle M.D.   On: 07/21/2019 22:34     ____________________________________________   PROCEDURES  Procedure(s) performed:yes .1-3 Lead EKG Interpretation Performed by: Nita Sickle, MD Authorized by:  Nita Sickle, MD     Interpretation: abnormal     Rhythm: atrial fibrillation     Ectopy: none     Conduction: abnormal (LBBB)     Critical Care performed: yes  CRITICAL CARE Performed by: Nita Sickle  ?  Total critical care time: 45 min  Critical care time was exclusive of separately billable procedures and treating other patients.  Critical care was necessary to treat or prevent imminent or life-threatening deterioration.  Critical care was time spent personally by me on the following activities: development of treatment plan with patient and/or surrogate as well as nursing, discussions with consultants, evaluation of patient's response to treatment, examination of patient, obtaining history from patient or surrogate, ordering and performing treatments and interventions, ordering and review of laboratory studies, ordering and review of radiographic studies, pulse oximetry and re-evaluation of patient's condition.  ____________________________________________   INITIAL IMPRESSION / ASSESSMENT AND PLAN / ED COURSE  86 y.o. female history of dementia, CAD, hypertension who presents for evaluation of altered mental status from home with son. Patient meets sepsis criteria on arrival with a pulse of 101, temp of 100.70F, no true localizing symptoms on exam, she is otherwise well-appearing with no meningeal signs, abdomen slightly tender diffusely with no rebound or guarding. Chest x-ray reviewed showing small bilateral pleural effusions and mild cardiomegaly, confirmed by radiology. EKG showing new left bundle branch block currently on A. fib with well controlled rate. Left bundle is new but A. fib is not. Labs showing white count of 25 with left shift, lactic acid of 3.7, labs showing AKI with creatinine of 1.14, hyper glycemia with no signs of DKA, hypokalemia and mild hyponatremia. Patient was given a liter of normal saline in the waiting room with no significant changes in her  lactic acid. With pleural effusions and cardiomegaly we will continue to give a hydration but cautiously. We will give another fluid bolus. Troponin is elevated at 60 most likely demand ischemia as patient is not complaining of any chest pain. UA and CT abdomen pelvis are pending. Broad-spectrum antibiotics initiated. Cultures are pending. History is gathered from patient and her son was at bedside. Old medical records reviewed. Patient placed on telemetry for close monitoring. Anticipate admission.  _________________________ 1:44 AM on 07/22/2019 -----------------------------------------  UA positive for UTI with presentation concerning for urosepsis.  CT abdomen pelvis reviewed and no acute pathology, confirmed by radiology.  Hospitalist paged for admission.  Broad-spectrum antibiotics given.    _____________________________________________ Please note:  Patient was evaluated in Emergency Department today for the symptoms described in the history of present illness. Patient was evaluated in the context of the global COVID-19 pandemic, which necessitated consideration that the patient might be at risk for infection with the SARS-CoV-2 virus that causes COVID-19. Institutional protocols and algorithms that pertain to the evaluation of patients at risk for COVID-19 are in a state of rapid change based on information released by regulatory bodies including the CDC and federal and state organizations. These policies and algorithms were followed during the patient's care in the ED.  Some ED evaluations and interventions may be delayed as a result of limited staffing during the pandemic.   [place] Controlled Substance Database was reviewed by me. ____________________________________________   FINAL CLINICAL IMPRESSION(S) / ED DIAGNOSES   Final diagnoses:  Sepsis due to urinary tract infection (HCC)  AKI (acute kidney injury) (HCC)  Hypokalemia  Hyponatremia  Demand ischemia (HCC)      NEW  MEDICATIONS STARTED DURING THIS VISIT:  ED Discharge Orders    None       Note:  This document was prepared using Dragon voice recognition software and may include unintentional dictation errors.    Don Perking, [place], MD 07/22/19 0144    Nita Sickle, MD 07/22/19 [phone]

## 2019-07-22 NOTE — Progress Notes (Addendum)
PROGRESS NOTE    Kimberly Wang  CBS:[id number] DOB: 12-09-1930 DOA: 07/22/2019 PCP: [name], MD     Brief Narrative:  Kimberly Wang  is a 56 WF PMHx Dementia, CAD, HTN, MI, coagulation defect  who presented to the emergency room with acute onset of altered mental status with generalized weakness and confusion and significant diminish p.o. intake especially today.  She vomited once.  She was having low-grade fever without chills.  She denied any abdominal pain.  No significant dysuria, hematuria or urgency or frequency or flank pain.  No cough or wheezing or dyspnea.  Upon presentation to the emergency room, temperature was 100.2 and heart rate 101 with otherwise normal vital signs.  CMP revealed hyponatremia and hypokalemia as well as hypochloremia with blood glucose of 223, BUN of 18 and creatinine 1.14, albumin of 3.4.  Lactic acid was 3.8 and later 3.7.  High-sensitivity troponin I was 60.  CBC showed leukocytosis of 25.1 with neutrophilia.  Urinalysis showed 6-10 WBCs with positive nitrite and trace leukocytes.  Blood cultures were drawn and urine culture was sent.  Abdominal and pelvic CT scan revealed paraesophageal hernia with no acute findings.  Chest x-ray showed bilateral small pleural effusions with mild cardiomegaly.  The patient was given 1 g of p.o. Tylenol, a gram of IV cefepime and a gram of IV vancomycin, 1 L of lactated Ringer, 10 medical IV potassium chloride and 40 mEq p.o. as well as 1 L IV normal saline.  She will be admitted to the progressive unit bed for further evaluation and management.   Subjective: A/O x1 (does not know where, when, why).  Pleasantly confused.  Follows commands.  Negative CP, negative S OB.   Assessment & Plan:   Active Problems:   Coagulation defect (HCC)   Sepsis due to gram-negative UTI (HCC)   Severe sepsis (HCC)   Dementia due to Parkinson's disease without behavioral disturbance (HCC)   Benign essential HTN   CAD (coronary  artery disease)  Severe sepsis/UTI -HR> 90, WBC> 12, lactic acid> 2 -Complete 5-day course of antibiotics -Normal saline 59ml/hr -Cultures pending  Dementia/Acute Metabolic encephalopathy. -Multifactorial to include severe sepsis, hyponatremia.  Son present able to give accurate baseline -son states currently near baseline. -Neurochecks q 4 hrs -Aricept 10 mg daily -Memantine 10 mg daily -Minimize sedating medication  Hyponatremia -Multifactorial volume depletion, SSRI, anorexia. -Correct underlying factors. -See severe sepsis -Zoloft 50 mg daily (hold) -Megace 400 mg daily ,  Elevated troponin high-sensitivity/demand ischemia Results for Kimberly, Wang (MRN [id number]) as of 07/22/2019 14:05  Ref. Range 07/21/2019 22:51 07/22/2019 00:38  Troponin I (High Sensitivity) Latest Ref Range: <18 ng/L 60 (H) 64 (H)  -Not consistent with ACS.  Most consistent with demand ischemia.  Essential HTN -Cardizem 240 mg daily  Dyslipidemia -Lipitor 80 mg daily -Lipid panel pending   Coagulation defect -Apixaban 5 mg BID  Hypokalemia -Potassium level> 4 -K-Dur 40 mEq  Hypomagnesmia -Potassium goal> 2 -Magnesium IV 3 g    DVT prophylaxis: Apixaban Code Status: Full Family Communication: 5/30 son present at bedside for discussion of plan of care all questions answered Disposition Plan:  Status is: Inpatient    Dispo: The patient is from: SNF              Anticipated d/c is to: SNF              Anticipated d/c date is: 7 June  Patient currently unstable      Consultants:  None   Procedures/Significant Events:  5/29 CXR;-small bilateral pleural effusions. -. Mild cardiomegaly. -Slightly prominent left perihilar region may be secondary to a dilated pulmonary artery or lymphadenopathy 5/30 CT abdomen pelvis W0 contrast; diverticulosis without diverticulitis.  Moderate paraesophageal hernia     I have personally reviewed and interpreted all  radiology studies and my findings are as above.  VENTILATOR SETTINGS:    Cultures 5/29 blood LEFT AC NGTD 5/29 blood RIGHT arm NGTD  Antimicrobials: Anti-infectives (From admission, onward)   Start     Dose/Rate Stop   07/22/19 0900  cefTRIAXone (ROCEPHIN) 1 g in sodium chloride 0.9 % 100 mL IVPB     1 g 200 mL/hr over 30 Minutes     07/22/19 0145  vancomycin (VANCOCIN) IVPB 1000 mg/200 mL premix     1,000 mg 200 mL/hr over 60 Minutes 07/22/19 0349   07/22/19 0045  ceFEPIme (MAXIPIME) 1 g in sodium chloride 0.9 % 100 mL IVPB     1 g 200 mL/hr over 30 Minutes 07/22/19 0202       Devices    LINES / TUBES:      Continuous Infusions: . sodium chloride 100 mL/hr at 07/22/19 0431  . cefTRIAXone (ROCEPHIN)  IV 1 g (07/22/19 1027)     Objective: Vitals:   07/22/19 0355 07/22/19 0528 07/22/19 0740 07/22/19 1050  BP: 119/86 (!) 121/92 122/89 (!) 142/72  Pulse: (!) 105 (!) 101 91 73  Resp: 20  20 20   Temp: 98.3 F (36.8 C) 98.7 F (37.1 C) (!) 97.5 F (36.4 C) (!) 97.4 F (36.3 C)  TempSrc: Oral Oral Oral Oral  SpO2: 97% 100% 98% 99%  Weight:      Height:        Intake/Output Summary (Last 24 hours) at 07/22/2019 1425 Last data filed at 07/22/2019 1200 Gross per 24 hour  Intake 3021.85 ml  Output --  Net 3021.85 ml   Filed Weights   07/21/19 2136  Weight: 72.6 kg    Examination:  General: A/O x1 (does not know where, when, why), no acute respiratory distress Eyes: negative scleral hemorrhage, negative anisocoria, negative icterus ENT: Negative Runny nose, negative gingival bleeding, Neck:  Negative scars, masses, torticollis, lymphadenopathy, JVD Lungs: Clear to auscultation bilaterally without wheezes or crackles Cardiovascular: Regular rate and rhythm without murmur gallop or rub normal S1 and S2 Abdomen: negative abdominal pain, nondistended, positive soft, bowel sounds, no rebound, no ascites, no appreciable mass Extremities: No significant  cyanosis, clubbing, or edema bilateral lower extremities Skin: Negative rashes, lesions, ulcers Psychiatric:  Negative depression, negative anxiety, negative fatigue, negative mania  Central nervous system:  Cranial nerves II through XII intact, tongue/uvula midline, all extremities muscle strength 5/5, sensation intact throughout,  negative dysarthria, negative expressive aphasia, negative receptive aphasia.  .     Data Reviewed: Care during the described time interval was provided by me .  I have reviewed this patient's available data, including medical history, events of note, physical examination, and all test results as part of my evaluation.  CBC: Recent Labs  Lab 07/21/19 2251 07/22/19 1214  WBC 25.1* 15.7*  NEUTROABS 23.3* 14.2*  HGB 13.8 12.3  HCT 40.6 35.9*  MCV 91.9 89.3  PLT 157 134*   Basic Metabolic Panel: Recent Labs  Lab 07/21/19 2251 07/22/19 0038 07/22/19 0646 07/22/19 1214  NA 130*  --  134* 134*  K 2.8*  --  3.4* 3.5  CL 94*  --  99 101  CO2 23  --  24 25  GLUCOSE 223*  --  119* 121*  BUN 18  --  18 17  CREATININE 1.14*  --  0.92 0.79  CALCIUM 8.7*  --  8.2* 8.3*  MG  --  1.7  --  1.6*   GFR: Estimated Creatinine Clearance: 49.3 mL/min (by C-G formula based on SCr of 0.79 mg/dL). Liver Function Tests: Recent Labs  Lab 07/21/19 2251  AST 38  ALT 28  ALKPHOS 126  BILITOT 1.2  PROT 7.3  ALBUMIN 3.4*   No results for input(s): LIPASE, AMYLASE in the last 168 hours. No results for input(s): AMMONIA in the last 168 hours. Coagulation Profile: Recent Labs  Lab 07/21/19 2208 07/22/19 0646  INR 1.6* 1.5*   Cardiac Enzymes: No results for input(s): CKTOTAL, CKMB, CKMBINDEX, TROPONINI in the last 168 hours. BNP (last 3 results) No results for input(s): PROBNP in the last 8760 hours. HbA1C: No results for input(s): HGBA1C in the last 72 hours. CBG: No results for input(s): GLUCAP in the last 168 hours. Lipid Profile: No results for  input(s): CHOL, HDL, LDLCALC, TRIG, CHOLHDL, LDLDIRECT in the last 72 hours. Thyroid Function Tests: No results for input(s): TSH, T4TOTAL, FREET4, T3FREE, THYROIDAB in the last 72 hours. Anemia Panel: No results for input(s): VITAMINB12, FOLATE, FERRITIN, TIBC, IRON, RETICCTPCT in the last 72 hours. Sepsis Labs: Recent Labs  Lab 07/21/19 2207 07/21/19 2251 07/22/19 0211 07/22/19 0646 07/22/19 1214  PROCALCITON  --   --  1.21 1.11 0.95  LATICACIDVEN 3.8* 3.7*  --   --  2.2*    Recent Results (from the past 240 hour(s))  Culture, blood (Routine x 2)     Status: None (Preliminary result)   Collection Time: 07/21/19 10:07 PM   Specimen: BLOOD  Result Value Ref Range Status   Specimen Description BLOOD LEFT ANTECUBITAL  Final   Special Requests   Final    BOTTLES DRAWN AEROBIC AND ANAEROBIC Blood Culture results may not be optimal due to an excessive volume of blood received in culture bottles   Culture   Final    NO GROWTH < 12 HOURS Performed at [facility], [address] [place], [place] [address]    Report Status PENDING  Incomplete  Culture, blood (Routine x 2)     Status: None (Preliminary result)   Collection Time: 07/21/19 10:51 PM   Specimen: BLOOD  Result Value Ref Range Status   Specimen Description BLOOD BLOOD RIGHT ARM  Final   Special Requests   Final    BOTTLES DRAWN AEROBIC AND ANAEROBIC Blood Culture adequate volume   Culture   Final    NO GROWTH < 12 HOURS Performed at [facility], [address] [place], [place] [address]    Report Status PENDING  Incomplete  SARS Coronavirus 2 by RT PCR (hospital order, performed in [facility] hospital lab) Nasopharyngeal Nasopharyngeal Swab     Status: None   Collection Time: 07/22/19 12:34 AM   Specimen: Nasopharyngeal Swab  Result Value Ref Range Status   SARS Coronavirus 2 NEGATIVE NEGATIVE Final    Comment: (NOTE) SARS-CoV-2 target nucleic acids are NOT DETECTED. The SARS-CoV-2 RNA is  generally detectable in upper and lower respiratory specimens during the acute phase of infection. The lowest concentration of SARS-CoV-2 viral copies this assay can detect is 250 copies / mL. A negative result does not preclude SARS-CoV-2 infection and should not  be used as the sole basis for treatment or other patient management decisions.  A negative result may occur with improper specimen collection / handling, submission of specimen other than nasopharyngeal swab, presence of viral mutation(s) within the areas targeted by this assay, and inadequate number of viral copies (<250 copies / mL). A negative result must be combined with clinical observations, patient history, and epidemiological information. Fact Sheet for Patients:   BoilerBrush.com.cy Fact Sheet for Healthcare Providers: https://pope.com/ This test is not yet approved or cleared  by the [place] FDA and has been authorized for detection and/or diagnosis of SARS-CoV-2 by FDA under an Emergency Use Authorization (EUA).  This EUA will remain in effect (meaning this test can be used) for the duration of the COVID-19 declaration under Section 564(b)(1) of the Act, 21 U.S.C. section 360bbb-3(b)(1), unless the authorization is terminated or revoked sooner. Performed at [facility], [address] [place], [place] [address]          Radiology Studies: CT ABDOMEN PELVIS WO CONTRAST  Result Date: 07/22/2019 CLINICAL DATA:  Sepsis EXAM: CT ABDOMEN AND PELVIS WITHOUT CONTRAST TECHNIQUE: Multidetector CT imaging of the abdomen and pelvis was performed following the standard protocol without IV contrast. COMPARISON:  None. FINDINGS: Lower chest: There is mild cardiomegaly. Mitral valve calcifications. A moderate paraesophageal hernia containing contrast is seen. Hepatobiliary: Although limited due to the lack of intravenous contrast, normal in appearance without  gross focal abnormality. The patient is status post cholecystectomy. No biliary ductal dilation. Pancreas:  Unremarkable.  No surrounding inflammatory changes. Spleen: Normal in size. Although limited due to the lack of intravenous contrast, normal in appearance. Adrenals/Urinary Tract: Both adrenal glands appear normal. The kidneys and collecting system appear normal without evidence of urinary tract calculus or hydronephrosis. Bladder is unremarkable. Stomach/Bowel: The stomach, small bowel, are normal in appearance. There is scattered colonic diverticula without diverticulitis. Vascular/Lymphatic: There are no enlarged abdominal or pelvic lymph nodes. Scattered aortic atherosclerotic calcifications are seen without aneurysmal dilatation. Reproductive: The uterus and adnexa are unremarkable. Other: A small fat and transverse colon containing umbilical hernia seen within the mid abdomen. Musculoskeletal: No acute or significant osseous findings. IMPRESSION: Moderate paraesophageal hernia. Diverticulosis without diverticulitis. No other acute intra-abdominal or pelvic pathology to explain the patient's symptoms. Aortic Atherosclerosis (ICD10-I70.0). Electronically Signed   By: Jonna Clark M.D.   On: 07/22/2019 01:40   DG Chest 1 View  Result Date: 07/21/2019 CLINICAL DATA:  Sepsis. EXAM: CHEST  1 VIEW COMPARISON:  None. FINDINGS: There are small bilateral pleural effusions. There is a left perihilar density of unknown clinical significance. The heart size is mildly enlarged. There are dense aortic calcifications. There are few streaky airspace opacities at the lung bases favored to represent areas of atelectasis. There is no pneumothorax. IMPRESSION: 1. Small bilateral pleural effusions. 2. Mild cardiomegaly. 3. Slightly prominent left perihilar region may be secondary to a dilated pulmonary artery or lymphadenopathy. A follow-up two-view chest x-ray is recommended in 4-6 weeks. Electronically Signed   By:  Katherine Mantle M.D.   On: 07/21/2019 22:34        Scheduled Meds: . apixaban  5 mg Oral BID  . atorvastatin  80 mg Oral Daily  . diltiazem  240 mg Oral Daily  . donepezil  10 mg Oral QHS  . ferrous sulfate  325 mg Oral Q breakfast  . megestrol  400 mg Oral Daily  . memantine  10 mg Oral Daily  . multivitamin with minerals  1 tablet Oral Daily  . [START ON 07/23/2019] pneumococcal 23 valent vaccine  0.5 mL Intramuscular Tomorrow-1000  . potassium chloride  40 mEq Oral Once  . potassium chloride SA  20 mEq Oral Daily   Continuous Infusions: . sodium chloride 100 mL/hr at 07/22/19 0431  . cefTRIAXone (ROCEPHIN)  IV 1 g (07/22/19 1027)     LOS: 0 days    Time spent:40 min    Fabio Wah, Geraldo Docker, MD [facility] Pager [phone]  If 7PM-7AM, please contact night-coverage [email or web address] Password [email or web address] 07/22/2019, 2:25 PM

## 2019-07-22 NOTE — H&P (Signed)
[place] at [facility]   PATIENT NAME: Kimberly Wang    MR#:  [id number]  DATE OF BIRTH:  Sep 16, 1930  DATE OF ADMISSION:  07/22/2019  PRIMARY CARE PHYSICIAN: [name], MD   REQUESTING/REFERRING PHYSICIAN: Cecil Cobbs, MD CHIEF COMPLAINT:   Chief Complaint  Patient presents with  . Altered Mental Status    HISTORY OF PRESENT ILLNESS:  Kimberly Wang  is a 86 y.o. female with a known history of coronary artery disease, hypertension and dementia, who presented to the emergency room with acute onset of altered mental status with generalized weakness and confusion and significant diminish p.o. intake especially today.  She vomited once.  She was having low-grade fever without chills.  She denied any abdominal pain.  No significant dysuria, hematuria or urgency or frequency or flank pain.  No cough or wheezing or dyspnea.  Upon presentation to the emergency room, temperature was 100.2 and heart rate 101 with otherwise normal vital signs.  CMP revealed hyponatremia and hypokalemia as well as hypochloremia with blood glucose of 223, BUN of 18 and creatinine 1.14, albumin of 3.4.  Lactic acid was 3.8 and later 3.7.  High-sensitivity troponin I was 60.  CBC showed leukocytosis of 25.1 with neutrophilia.  Urinalysis showed 6-10 WBCs with positive nitrite and trace leukocytes.  Blood cultures were drawn and urine culture was sent.  Abdominal and pelvic CT scan revealed paraesophageal hernia with no acute findings.  Chest x-ray showed bilateral small pleural effusions with mild cardiomegaly.  The patient was given 1 g of p.o. Tylenol, a gram of IV cefepime and a gram of IV vancomycin, 1 L of lactated Ringer, 10 medical IV potassium chloride and 40 mEq p.o. as well as 1 L IV normal saline.  She will be admitted to the progressive unit bed for further evaluation and management.  PAST MEDICAL HISTORY:   Past Medical History:  Diagnosis Date  . Coronary artery disease   .  Dementia (HCC)   . Hypertension   . Myocardial infarct [facility])     PAST SURGICAL HISTORY:   Past Surgical History:  Procedure Laterality Date  . CHOLECYSTECTOMY    . CORONARY STENT PLACEMENT      SOCIAL HISTORY:   Social History   Tobacco Use  . Smoking status: Never Smoker  . Smokeless tobacco: Never Used  Substance Use Topics  . Alcohol use: Never    FAMILY HISTORY:  No family history on file.  DRUG ALLERGIES:   Allergies  Allergen Reactions  . Latex Hives and Rash  . Lidocaine Hcl Other (See Comments)    Other Reaction: Other reaction. Patient can't take Novacaine  . Codeine Other (See Comments)  . Morphine Other (See Comments)  . Novocain [Procaine]     Heart races  . Other Other (See Comments)    Uncoded Allergy. Allergen: RED PEPPER    REVIEW OF SYSTEMS:   ROS As per history of present illness. All pertinent systems were reviewed above. Constitutional,  HEENT, cardiovascular, respiratory, GI, GU, musculoskeletal, neuro, psychiatric, endocrine,  integumentary and hematologic systems were reviewed and are otherwise  negative/unremarkable except for positive findings mentioned above in the HPI.   MEDICATIONS AT HOME:   Prior to Admission medications   Medication Sig Start Date End Date Taking? Authorizing Provider  atorvastatin (LIPITOR) 80 MG tablet Take by mouth. 11/21/18   [provider]  diltiazem (CARDIZEM CD) 240 MG 24 hr capsule  03/28/19   [provider]  donepezil (ARICEPT) 10 MG tablet  03/28/19   [provider]  ELIQUIS 5 MG TABS tablet  04/15/19   [provider]  FEROSUL 325 (65 Fe) MG tablet  03/28/19   [provider]  furosemide (LASIX) 80 MG tablet  03/28/19   [provider]  memantine (NAMENDA) 10 MG tablet  03/28/19   [provider]  Multiple Vitamin (MULTIVITAMIN) capsule Take by mouth.    [provider]  nitroGLYCERIN (NITROSTAT) 0.4 MG SL tablet Place under the  tongue. 11/21/18   [provider]  potassium chloride SA (KLOR-CON) 20 MEQ tablet Take by mouth. 11/21/18   [provider]  sertraline (ZOLOFT) 50 MG tablet  04/15/19   [provider]  spironolactone (ALDACTONE) 25 MG tablet Take by mouth. 11/21/18   [provider]      VITAL SIGNS:  Blood pressure (!) 149/89, pulse (!) 105, temperature 100.2 F (37.9 C), temperature source Oral, resp. rate 18, height 5\' 4"  (1.626 m), weight 72.6 kg, SpO2 97 %.  PHYSICAL EXAMINATION:  Physical Exam  GENERAL:  86 y.o.-year-old Caucasian female patient lying in the bed with no acute distress.  EYES: Pupils equal, round, reactive to light and accommodation. No scleral icterus. Extraocular muscles intact.  HEENT: Head atraumatic, normocephalic. Oropharynx and nasopharynx clear.  NECK:  Supple, no jugular venous distention. No thyroid enlargement, no tenderness.  LUNGS: Normal breath sounds bilaterally, no wheezing, rales,rhonchi or crepitation. No use of accessory muscles of respiration.  CARDIOVASCULAR: Regular rate and rhythm, S1, S2 normal. No murmurs, rubs, or gallops.  ABDOMEN: Soft, nondistended, nontender. Bowel sounds present. No organomegaly or mass.  No CVA tenderness. EXTREMITIES: No pedal edema, cyanosis, or clubbing.  NEUROLOGIC: Cranial nerves II through XII are intact. Muscle strength 5/5 in all extremities. Sensation intact. Gait not checked.  PSYCHIATRIC: The patient is alert and oriented x 3.  Normal affect and good eye contact. SKIN: No obvious rash, lesion, or ulcer.   LABORATORY PANEL:   CBC Recent Labs  Lab 07/21/19 2251  WBC 25.1*  HGB 13.8  HCT 40.6  PLT 157   ------------------------------------------------------------------------------------------------------------------  Chemistries  Recent Labs  Lab 07/21/19 2251  NA 130*  K 2.8*  CL 94*  CO2 23  GLUCOSE 223*  BUN 18  CREATININE 1.14*  CALCIUM 8.7*  AST 38  ALT 28   ALKPHOS 126  BILITOT 1.2   ------------------------------------------------------------------------------------------------------------------  Cardiac Enzymes No results for input(s): TROPONINI in the last 168 hours. ------------------------------------------------------------------------------------------------------------------  RADIOLOGY:  CT ABDOMEN PELVIS WO CONTRAST  Result Date: 07/22/2019 CLINICAL DATA:  Sepsis EXAM: CT ABDOMEN AND PELVIS WITHOUT CONTRAST TECHNIQUE: Multidetector CT imaging of the abdomen and pelvis was performed following the standard protocol without IV contrast. COMPARISON:  None. FINDINGS: Lower chest: There is mild cardiomegaly. Mitral valve calcifications. A moderate paraesophageal hernia containing contrast is seen. Hepatobiliary: Although limited due to the lack of intravenous contrast, normal in appearance without gross focal abnormality. The patient is status post cholecystectomy. No biliary ductal dilation. Pancreas:  Unremarkable.  No surrounding inflammatory changes. Spleen: Normal in size. Although limited due to the lack of intravenous contrast, normal in appearance. Adrenals/Urinary Tract: Both adrenal glands appear normal. The kidneys and collecting system appear normal without evidence of urinary tract calculus or hydronephrosis. Bladder is unremarkable. Stomach/Bowel: The stomach, small bowel, are normal in appearance. There is scattered colonic diverticula without diverticulitis. Vascular/Lymphatic: There are no enlarged abdominal or pelvic lymph nodes. Scattered aortic atherosclerotic calcifications are seen without  aneurysmal dilatation. Reproductive: The uterus and adnexa are unremarkable. Other: A small fat and transverse colon containing umbilical hernia seen within the mid abdomen. Musculoskeletal: No acute or significant osseous findings. IMPRESSION: Moderate paraesophageal hernia. Diverticulosis without diverticulitis. No other acute intra-abdominal  or pelvic pathology to explain the patient's symptoms. Aortic Atherosclerosis (ICD10-I70.0). Electronically Signed   By: Prudencio Pair M.D.   On: 07/22/2019 01:40   DG Chest 1 View  Result Date: 07/21/2019 CLINICAL DATA:  Sepsis. EXAM: CHEST  1 VIEW COMPARISON:  None. FINDINGS: There are small bilateral pleural effusions. There is a left perihilar density of unknown clinical significance. The heart size is mildly enlarged. There are dense aortic calcifications. There are few streaky airspace opacities at the lung bases favored to represent areas of atelectasis. There is no pneumothorax. IMPRESSION: 1. Small bilateral pleural effusions. 2. Mild cardiomegaly. 3. Slightly prominent left perihilar region may be secondary to a dilated pulmonary artery or lymphadenopathy. A follow-up two-view chest x-ray is recommended in 4-6 weeks. Electronically Signed   By: Constance Holster M.D.   On: 07/21/2019 22:34      IMPRESSION AND PLAN:   1.  Sepsis due to UTI.  She has no evidence for severe sepsis or septic shock.  This is manifested by leukocytosis, tachycardia, tachypnea and elevated lactic acid. -The patient will be admitted to a progressive unit bed. -We will continue antibiotic therapy with IV Rocephin. -We will continue hydration with IV normal saline. -We will follow lactic acid level as well as CBC with differential. -We will follow blood and urine cultures.  2.  Metabolic encephalopathy. -This likely secondary to #1 and compounded by her dementia. -We will follow neuro checks every 4 hours for 24 hours.  3.  Hypokalemia. -Potassium will be placed magnesium level will be checked.  4.  Hyponatremia likely secondary to anorexia and vomiting. -This is likely related to volume depletion and dehydration. -We will hydrate with IV normal saline and follow BMPs.  5.  Elevated troponin I. -This likely secondary to demand ischemia. -We will follow serial troponins.  6.  Hypertension. -We will  continue her antihypertensives with holding parameters.  7.  Dyslipidemia. -We will continue statin therapy.  8.  Dementia. -We will continue Aricept and Namenda.  9.  DVT prophylaxis. -Subcutaneous Lovenox.    All the records are reviewed and case discussed with ED provider. The plan of care was discussed in details with the patient (and family). I answered all questions. The patient agreed to proceed with the above mentioned plan. Further management will depend upon hospital course.   CODE STATUS: Full code  Status is: Inpatient  Remains inpatient appropriate because:Persistent severe electrolyte disturbances, Altered mental status, Ongoing diagnostic testing needed not appropriate for outpatient work up, Unsafe d/c plan, IV treatments appropriate due to intensity of illness or inability to take PO and Inpatient level of care appropriate due to severity of illness   Dispo: The patient is from: Home              Anticipated d/c is to: Home              Anticipated d/c date is: 2 days              Patient currently is not medically stable to d/c.    TOTAL TIME TAKING CARE OF THIS PATIENT: 55 minutes.    Christel Mormon M.D on 07/22/2019 at 2:53 AM  [facility]   From 7 PM-7 AM,  contact night-coverage [email or web address]  CC: Primary care physician; [name], MD   Note: This dictation was prepared with Dragon dictation along with smaller phrase technology. Any transcriptional errors that result from this process are unintentional.

## 2019-07-22 NOTE — ED Notes (Signed)
Transport to floor room 244.AS

## 2019-07-23 ENCOUNTER — Inpatient Hospital Stay: Payer: Medicare PPO

## 2019-07-23 LAB — COMPREHENSIVE METABOLIC PANEL
ALT: 27 U/L (ref 0–44)
AST: 40 U/L (ref 15–41)
Albumin: 2.8 g/dL — ABNORMAL LOW (ref 3.5–5.0)
Alkaline Phosphatase: 96 U/L (ref 38–126)
Anion gap: 8 (ref 5–15)
BUN: 16 mg/dL (ref 8–23)
CO2: 24 mmol/L (ref 22–32)
Calcium: 8.5 mg/dL — ABNORMAL LOW (ref 8.9–10.3)
Chloride: 103 mmol/L (ref 98–111)
Creatinine, Ser: 0.63 mg/dL (ref 0.44–1.00)
GFR calc Af Amer: >60 mL/min (ref >60)
GFR calc non Af Amer: >60 mL/min (ref >60)
Glucose, Bld: 119 mg/dL — ABNORMAL HIGH (ref 70–99)
Potassium: 4.7 mmol/L (ref 3.5–5.1)
Sodium: 135 mmol/L (ref 135–145)
Total Bilirubin: 1 mg/dL (ref 0.3–1.2)
Total Protein: 5.9 g/dL — ABNORMAL LOW (ref 6.5–8.1)

## 2019-07-23 LAB — CBC WITH DIFFERENTIAL/PLATELET
Abs Immature Granulocytes: 0.07 10*3/uL (ref 0.00–0.07)
Basophils Absolute: 0.1 10*3/uL (ref 0.0–0.1)
Basophils Relative: 0 %
Eosinophils Absolute: 0.1 10*3/uL (ref 0.0–0.5)
Eosinophils Relative: 1 %
HCT: 36 % (ref 36.0–46.0)
Hemoglobin: 11.8 g/dL — ABNORMAL LOW (ref 12.0–15.0)
Immature Granulocytes: 1 %
Lymphocytes Relative: 5 %
Lymphs Abs: 0.6 10*3/uL — ABNORMAL LOW (ref 0.7–4.0)
MCH: 30.3 pg (ref 26.0–34.0)
MCHC: 32.8 g/dL (ref 30.0–36.0)
MCV: 92.5 fL (ref 80.0–100.0)
Monocytes Absolute: 0.7 10*3/uL (ref 0.1–1.0)
Monocytes Relative: 5 %
Neutro Abs: 12.7 10*3/uL — ABNORMAL HIGH (ref 1.7–7.7)
Neutrophils Relative %: 88 %
Platelets: 143 10*3/uL — ABNORMAL LOW (ref 150–400)
RBC: 3.89 MIL/uL (ref 3.87–5.11)
RDW: 13.7 % (ref 11.5–15.5)
WBC: 14.3 10*3/uL — ABNORMAL HIGH (ref 4.0–10.5)
nRBC: 0 % (ref 0.0–0.2)

## 2019-07-23 LAB — LIPID PANEL
Cholesterol: 72 mg/dL (ref 0–200)
HDL: 17 mg/dL — ABNORMAL LOW (ref >40)
LDL Cholesterol: 38 mg/dL (ref 0–99)
Total CHOL/HDL Ratio: 4.2 RATIO
Triglycerides: 86 mg/dL (ref <150)
VLDL: 17 mg/dL (ref 0–40)

## 2019-07-23 LAB — MAGNESIUM: Magnesium: 2.3 mg/dL (ref 1.7–2.4)

## 2019-07-23 LAB — LACTIC ACID, PLASMA
Lactic Acid, Venous: 4.1 mmol/L (ref 0.5–1.9)
Lactic Acid, Venous: 2.8 mmol/L (ref 0.5–1.9)

## 2019-07-23 LAB — PHOSPHORUS: Phosphorus: 1.7 mg/dL — ABNORMAL LOW (ref 2.5–4.6)

## 2019-07-23 LAB — PROCALCITONIN: Procalcitonin: 0.68 ng/mL

## 2019-07-23 MED ORDER — QUETIAPINE FUMARATE 25 MG PO TABS
25.0000 mg | ORAL_TABLET | Freq: Every evening | ORAL | Status: DC | PRN
  Administered 2019-07-23 – 2019-08-09 (×16): 25 mg via ORAL
  Filled 2019-07-23 (×16): qty 1

## 2019-07-23 MED ORDER — METHYLPREDNISOLONE SODIUM SUCC 40 MG IJ SOLR
40.0000 mg | Freq: Once | INTRAMUSCULAR | Status: AC
  Administered 2019-07-23: 40 mg via INTRAVENOUS
  Filled 2019-07-23: qty 1

## 2019-07-23 MED ORDER — SODIUM PHOSPHATES 45 MMOLE/15ML IV SOLN
30.0000 mmol | Freq: Once | INTRAVENOUS | Status: AC
  Administered 2019-07-23: 30 mmol via INTRAVENOUS
  Filled 2019-07-23: qty 10

## 2019-07-23 MED ORDER — IPRATROPIUM-ALBUTEROL 0.5-2.5 (3) MG/3ML IN SOLN
3.0000 mL | Freq: Four times a day (QID) | RESPIRATORY_TRACT | Status: DC | PRN
  Administered 2019-07-23 – 2019-08-05 (×11): 3 mL via RESPIRATORY_TRACT
  Filled 2019-07-23 (×13): qty 3

## 2019-07-23 NOTE — Progress Notes (Signed)
Duoneb given for intermittent upper airway wheezing. Pt denies SOB or any pulmonary hx. She states she sometimes wheezes like this in the AM, but not every day.

## 2019-07-23 NOTE — Progress Notes (Signed)
PROGRESS NOTE    Kimberly Wang  GYF:[id number] DOB: 1930/10/15 DOA: 07/22/2019 PCP: Valera Castle, MD     Brief Narrative:  Kimberly Wang  is a 85 WF PMHx Dementia, CAD, HTN, MI, coagulation defect  who presented to the emergency room with acute onset of altered mental status with generalized weakness and confusion and significant diminish p.o. intake especially today.  She vomited once.  She was having low-grade fever without chills.  She denied any abdominal pain.  No significant dysuria, hematuria or urgency or frequency or flank pain.  No cough or wheezing or dyspnea.  Upon presentation to the emergency room, temperature was 100.2 and heart rate 101 with otherwise normal vital signs.  CMP revealed hyponatremia and hypokalemia as well as hypochloremia with blood glucose of 223, BUN of 18 and creatinine 1.14, albumin of 3.4.  Lactic acid was 3.8 and later 3.7.  High-sensitivity troponin I was 60.  CBC showed leukocytosis of 25.1 with neutrophilia.  Urinalysis showed 6-10 WBCs with positive nitrite and trace leukocytes.  Blood cultures were drawn and urine culture was sent.  Abdominal and pelvic CT scan revealed paraesophageal hernia with no acute findings.  Chest x-ray showed bilateral small pleural effusions with mild cardiomegaly.  The patient was given 1 g of p.o. Tylenol, a gram of IV cefepime and a gram of IV vancomycin, 1 L of lactated Ringer, 10 medical IV potassium chloride and 40 mEq p.o. as well as 1 L IV normal saline.  She will be admitted to the progressive unit bed for further evaluation and management.   Subjective: 5/31 afebrile overnight, A/O x1 (does not know where, when, why).  Pleasantly confused.  Follows commands.  Negative CP, negative S OB, negative abdominal pain.   Assessment & Plan:   Active Problems:   Coagulation defect ([place])   Sepsis due to gram-negative UTI (HCC)   Severe sepsis (HCC)   Dementia due to Parkinson's disease without behavioral  disturbance (HCC)   Benign essential HTN   CAD (coronary artery disease)  Severe sepsis/UTI -HR> 90, WBC> 12, lactic acid> 2 -Complete 5-day course of antibiotics -Normal saline 1ml/hr -Cultures pending  Dementia/Acute Metabolic encephalopathy. -Multifactorial to include severe sepsis, hyponatremia.  Son present able to give accurate baseline -son states currently near baseline. -Neurochecks q 4 hrs -Aricept 10 mg daily -Memantine 10 mg daily -Minimize sedating medication  Hyponatremia -Multifactorial volume depletion, SSRI, anorexia. -Correct underlying factors. -See severe sepsis -Zoloft 50 mg daily (hold) -Megace 400 mg daily ,  Elevated troponin high-sensitivity/demand ischemia Results for ALTAIR, STANKO (MRN [id number]) as of 07/22/2019 14:05  Ref. Range 07/21/2019 22:51 07/22/2019 00:38  Troponin I (High Sensitivity) Latest Ref Range: <18 ng/L 60 (H) 64 (H)  -Not consistent with ACS.  Most consistent with demand ischemia.  Essential HTN -Cardizem 240 mg daily  Dyslipidemia -Lipitor 80 mg daily -Lipid panel pending   Coagulation defect -Apixaban 5 mg BID  Hypokalemia -Potassium level> 4 -K-Dur 40 mEq  Hypomagnesmia -Potassium goal> 2 -Magnesium IV 3 g  Hypophosphatemia -Goal> 2.5 -Sodium phosphate 30 mmol    DVT prophylaxis: Apixaban Code Status: Full Family Communication: 5/30 son present at bedside for discussion of plan of care all questions answered Disposition Plan:  Status is: Inpatient    Dispo: The patient is from: SNF              Anticipated d/c is to: SNF              Anticipated d/c date  is: 7 June              Patient currently unstable      Consultants:  None   Procedures/Significant Events:  5/29 CXR;-small bilateral pleural effusions. -. Mild cardiomegaly. -Slightly prominent left perihilar region may be secondary to a dilated pulmonary artery or lymphadenopathy 5/30 CT abdomen pelvis W0 contrast; diverticulosis  without diverticulitis.  Moderate paraesophageal hernia     I have personally reviewed and interpreted all radiology studies and my findings are as above.  VENTILATOR SETTINGS: Room air 5/31 SPO2; 96%   Cultures 5/29 blood LEFT AC NGTD 5/29 blood RIGHT arm NGTD  Antimicrobials: Anti-infectives (From admission, onward)   Start     Dose/Rate Stop   07/22/19 0900  cefTRIAXone (ROCEPHIN) 1 g in sodium chloride 0.9 % 100 mL IVPB     1 g 200 mL/hr over 30 Minutes     07/22/19 0145  vancomycin (VANCOCIN) IVPB 1000 mg/200 mL premix     1,000 mg 200 mL/hr over 60 Minutes 07/22/19 0349   07/22/19 0045  ceFEPIme (MAXIPIME) 1 g in sodium chloride 0.9 % 100 mL IVPB     1 g 200 mL/hr over 30 Minutes 07/22/19 0202       Devices    LINES / TUBES:      Continuous Infusions: . sodium chloride 50 mL/hr at 07/23/19 0300  . cefTRIAXone (ROCEPHIN)  IV 1 g (07/23/19 0938)     Objective: Vitals:   07/23/19 0430 07/23/19 0500 07/23/19 0515 07/23/19 0738  BP: (!) 123/95   132/61  Pulse: 89   79  Resp: 16   20  Temp: 97.9 F (36.6 C)   97.9 F (36.6 C)  TempSrc: Oral   Oral  SpO2: 98%  93% 96%  Weight:  72.7 kg    Height:        Intake/Output Summary (Last 24 hours) at 07/23/2019 1101 Last data filed at 07/23/2019 0950 Gross per 24 hour  Intake 1303.38 ml  Output 350 ml  Net 953.38 ml   Filed Weights   07/21/19 2136 07/23/19 0500  Weight: 72.6 kg 72.7 kg    Examination:  General: A/O x1 (does not know where, when, why), no acute respiratory distress Eyes: negative scleral hemorrhage, negative anisocoria, negative icterus ENT: Negative Runny nose, negative gingival bleeding, Neck:  Negative scars, masses, torticollis, lymphadenopathy, JVD Lungs: Clear to auscultation bilaterally without wheezes or crackles Cardiovascular: Regular rate and rhythm without murmur gallop or rub normal S1 and S2 Abdomen: negative abdominal pain, nondistended, positive soft, bowel sounds,  no rebound, no ascites, no appreciable mass Extremities: No significant cyanosis, clubbing, or edema bilateral lower extremities Skin: Negative rashes, lesions, ulcers Psychiatric:  Negative depression, negative anxiety, negative fatigue, negative mania  Central nervous system:  Cranial nerves II through XII intact, tongue/uvula midline, all extremities muscle strength 5/5, sensation intact throughout,  negative dysarthria, negative expressive aphasia, negative receptive aphasia.  .     Data Reviewed: Care during the described time interval was provided by me .  I have reviewed this patient's available data, including medical history, events of note, physical examination, and all test results as part of my evaluation.  CBC: Recent Labs  Lab 07/21/19 2251 07/22/19 1214 07/23/19 0433  WBC 25.1* 15.7* 14.3*  NEUTROABS 23.3* 14.2* 12.7*  HGB 13.8 12.3 11.8*  HCT 40.6 35.9* 36.0  MCV 91.9 89.3 92.5  PLT 157 134* 143*   Basic Metabolic Panel: Recent Labs  Lab 07/21/19  2251 07/22/19 0038 07/22/19 0646 07/22/19 1214 07/23/19 0433  NA 130*  --  134* 134* 135  K 2.8*  --  3.4* 3.5 4.7  CL 94*  --  99 101 103  CO2 23  --  24 25 24   GLUCOSE 223*  --  119* 121* 119*  BUN 18  --  18 17 16   CREATININE 1.14*  --  0.92 0.79 0.63  CALCIUM 8.7*  --  8.2* 8.3* 8.5*  MG  --  1.7  --  1.6* 2.3  PHOS  --   --   --   --  1.7*   GFR: Estimated Creatinine Clearance: 49.3 mL/min (by C-G formula based on SCr of 0.63 mg/dL). Liver Function Tests: Recent Labs  Lab 07/21/19 2251 07/23/19 0433  AST 38 40  ALT 28 27  ALKPHOS 126 96  BILITOT 1.2 1.0  PROT 7.3 5.9*  ALBUMIN 3.4* 2.8*   No results for input(s): LIPASE, AMYLASE in the last 168 hours. No results for input(s): AMMONIA in the last 168 hours. Coagulation Profile: Recent Labs  Lab 07/21/19 2208 07/22/19 0646  INR 1.6* 1.5*   Cardiac Enzymes: No results for input(s): CKTOTAL, CKMB, CKMBINDEX, TROPONINI in the last 168  hours. BNP (last 3 results) No results for input(s): PROBNP in the last 8760 hours. HbA1C: No results for input(s): HGBA1C in the last 72 hours. CBG: No results for input(s): GLUCAP in the last 168 hours. Lipid Profile: Recent Labs    07/23/19 0433  CHOL 72  HDL 17*  LDLCALC 38  TRIG 86  CHOLHDL 4.2   Thyroid Function Tests: No results for input(s): TSH, T4TOTAL, FREET4, T3FREE, THYROIDAB in the last 72 hours. Anemia Panel: No results for input(s): VITAMINB12, FOLATE, FERRITIN, TIBC, IRON, RETICCTPCT in the last 72 hours. Sepsis Labs: Recent Labs  Lab 07/21/19 2207 07/21/19 2251 07/22/19 0211 07/22/19 0646 07/22/19 1214 07/22/19 1855 07/23/19 0433  PROCALCITON  --   --  1.21 1.11 0.95  --  0.68  LATICACIDVEN 3.8* 3.7*  --   --  2.2* 2.1*  --     Recent Results (from the past 240 hour(s))  Culture, blood (Routine x 2)     Status: None (Preliminary result)   Collection Time: 07/21/19 10:07 PM   Specimen: BLOOD  Result Value Ref Range Status   Specimen Description BLOOD LEFT ANTECUBITAL  Final   Special Requests   Final    BOTTLES DRAWN AEROBIC AND ANAEROBIC Blood Culture results may not be optimal due to an excessive volume of blood received in culture bottles   Culture   Final    NO GROWTH 2 DAYS Performed at [facility], [address] [place], [place] [address]    Report Status PENDING  Incomplete  Culture, blood (Routine x 2)     Status: None (Preliminary result)   Collection Time: 07/21/19 10:51 PM   Specimen: BLOOD  Result Value Ref Range Status   Specimen Description BLOOD BLOOD RIGHT ARM  Final   Special Requests   Final    BOTTLES DRAWN AEROBIC AND ANAEROBIC Blood Culture adequate volume   Culture   Final    NO GROWTH 2 DAYS Performed at [facility], [address] [place], [place] [address]    Report Status PENDING  Incomplete  SARS Coronavirus 2 by RT PCR (hospital order, performed in [facility] hospital lab) Nasopharyngeal  Nasopharyngeal Swab     Status: None   Collection Time: 07/22/19 12:34  AM   Specimen: Nasopharyngeal Swab  Result Value Ref Range Status   SARS Coronavirus 2 NEGATIVE NEGATIVE Final    Comment: (NOTE) SARS-CoV-2 target nucleic acids are NOT DETECTED. The SARS-CoV-2 RNA is generally detectable in upper and lower respiratory specimens during the acute phase of infection. The lowest concentration of SARS-CoV-2 viral copies this assay can detect is 250 copies / mL. A negative result does not preclude SARS-CoV-2 infection and should not be used as the sole basis for treatment or other patient management decisions.  A negative result may occur with improper specimen collection / handling, submission of specimen other than nasopharyngeal swab, presence of viral mutation(s) within the areas targeted by this assay, and inadequate number of viral copies (<250 copies / mL). A negative result must be combined with clinical observations, patient history, and epidemiological information. Fact Sheet for Patients:   BoilerBrush.com.cy Fact Sheet for Healthcare Providers: https://pope.com/ This test is not yet approved or cleared  by the [place] FDA and has been authorized for detection and/or diagnosis of SARS-CoV-2 by FDA under an Emergency Use Authorization (EUA).  This EUA will remain in effect (meaning this test can be used) for the duration of the COVID-19 declaration under Section 564(b)(1) of the Act, 21 U.S.C. section 360bbb-3(b)(1), unless the authorization is terminated or revoked sooner. Performed at [facility], [address] [place], [place] [address]          Radiology Studies: CT ABDOMEN PELVIS WO CONTRAST  Result Date: 07/22/2019 CLINICAL DATA:  Sepsis EXAM: CT ABDOMEN AND PELVIS WITHOUT CONTRAST TECHNIQUE: Multidetector CT imaging of the abdomen and pelvis was performed following the standard protocol without  IV contrast. COMPARISON:  None. FINDINGS: Lower chest: There is mild cardiomegaly. Mitral valve calcifications. A moderate paraesophageal hernia containing contrast is seen. Hepatobiliary: Although limited due to the lack of intravenous contrast, normal in appearance without gross focal abnormality. The patient is status post cholecystectomy. No biliary ductal dilation. Pancreas:  Unremarkable.  No surrounding inflammatory changes. Spleen: Normal in size. Although limited due to the lack of intravenous contrast, normal in appearance. Adrenals/Urinary Tract: Both adrenal glands appear normal. The kidneys and collecting system appear normal without evidence of urinary tract calculus or hydronephrosis. Bladder is unremarkable. Stomach/Bowel: The stomach, small bowel, are normal in appearance. There is scattered colonic diverticula without diverticulitis. Vascular/Lymphatic: There are no enlarged abdominal or pelvic lymph nodes. Scattered aortic atherosclerotic calcifications are seen without aneurysmal dilatation. Reproductive: The uterus and adnexa are unremarkable. Other: A small fat and transverse colon containing umbilical hernia seen within the mid abdomen. Musculoskeletal: No acute or significant osseous findings. IMPRESSION: Moderate paraesophageal hernia. Diverticulosis without diverticulitis. No other acute intra-abdominal or pelvic pathology to explain the patient's symptoms. Aortic Atherosclerosis (ICD10-I70.0). Electronically Signed   By: Jonna Clark M.D.   On: 07/22/2019 01:40   DG Chest 1 View  Result Date: 07/23/2019 CLINICAL DATA:  Dyspnea EXAM: CHEST  1 VIEW COMPARISON:  Chest radiograph from one day prior. FINDINGS: Stable cardiomediastinal silhouette with mild cardiomegaly. No pneumothorax. Stable small left pleural effusion. No right pleural effusion. No overt pulmonary edema. Streaky bibasilar lung opacities are similar IMPRESSION: Stable small left pleural effusion. Stable streaky bibasilar  lung opacities, favor atelectasis or scarring. Stable mild cardiomegaly without overt pulmonary edema. Electronically Signed   By: Delbert Phenix M.D.   On: 07/23/2019 04:50   DG Chest 1 View  Result Date: 07/21/2019 CLINICAL DATA:  Sepsis. EXAM: CHEST  1 VIEW COMPARISON:  None.  FINDINGS: There are small bilateral pleural effusions. There is a left perihilar density of unknown clinical significance. The heart size is mildly enlarged. There are dense aortic calcifications. There are few streaky airspace opacities at the lung bases favored to represent areas of atelectasis. There is no pneumothorax. IMPRESSION: 1. Small bilateral pleural effusions. 2. Mild cardiomegaly. 3. Slightly prominent left perihilar region may be secondary to a dilated pulmonary artery or lymphadenopathy. A follow-up two-view chest x-ray is recommended in 4-6 weeks. Electronically Signed   By: Katherine Mantle M.D.   On: 07/21/2019 22:34   DG Chest Port 1 View  Result Date: 07/22/2019 CLINICAL DATA:  Shortness of breath. EXAM: PORTABLE CHEST 1 VIEW COMPARISON:  Jul 21, 2019 FINDINGS: Cardiomegaly. Skin fold over the lateral right chest. The hila and mediastinum are normal. No pneumothorax. No nodules or masses. No focal infiltrates. IMPRESSION: No active disease. Electronically Signed   By: Gerome Sam III M.D   On: 07/22/2019 15:12        Scheduled Meds: . apixaban  5 mg Oral BID  . atorvastatin  80 mg Oral Daily  . diltiazem  240 mg Oral Daily  . donepezil  10 mg Oral QHS  . ferrous sulfate  325 mg Oral Q breakfast  . megestrol  400 mg Oral Daily  . memantine  10 mg Oral Daily  . multivitamin with minerals  1 tablet Oral Daily  . pneumococcal 23 valent vaccine  0.5 mL Intramuscular Tomorrow-1000  . potassium chloride  40 mEq Oral Once  . potassium chloride SA  20 mEq Oral Daily   Continuous Infusions: . sodium chloride 50 mL/hr at 07/23/19 0300  . cefTRIAXone (ROCEPHIN)  IV 1 g (07/23/19 0938)     LOS: 1  day    Time spent:40 min    Lakita Sahlin, Roselind Messier, MD [facility] Pager [phone]  If 7PM-7AM, please contact night-coverage [email or web address] Password [email or web address] 07/23/2019, 11:01 AM

## 2019-07-23 NOTE — Progress Notes (Addendum)
Patient remains awake, very forgetful and confused; very cooperative, however. 1:1 safety sitters at bedside. Up to [facility], and tolerates fairly well. Patient does appear tachypneic, with increased work of breathing. Lungs are CTA, but diminished. CXR earlier today revealed no active disease. Audible expiratory breath sounds, (without stethoscope) wheezes vs stridor. Pt denies SOB. O2 sat 95% on RA. VS taken, WNL, MAP less than earlier at 63.   Secure chat with this update sent to E. Ouma. Will CTM.   0530 update: E. Ouma to bedside to assess patient. Received orders for Duoneb nebulizer, given by RT. Also received order for Solumedrol 40 mg IV; given as ordered. PCXR also done

## 2019-07-24 LAB — COMPREHENSIVE METABOLIC PANEL
ALT: 29 U/L (ref 0–44)
AST: 35 U/L (ref 15–41)
Albumin: 2.5 g/dL — ABNORMAL LOW (ref 3.5–5.0)
Alkaline Phosphatase: 85 U/L (ref 38–126)
Anion gap: 9 (ref 5–15)
BUN: 23 mg/dL (ref 8–23)
CO2: 22 mmol/L (ref 22–32)
Calcium: 8.6 mg/dL — ABNORMAL LOW (ref 8.9–10.3)
Chloride: 105 mmol/L (ref 98–111)
Creatinine, Ser: 0.64 mg/dL (ref 0.44–1.00)
GFR calc Af Amer: >60 mL/min (ref >60)
GFR calc non Af Amer: >60 mL/min (ref >60)
Glucose, Bld: 140 mg/dL — ABNORMAL HIGH (ref 70–99)
Potassium: 4.7 mmol/L (ref 3.5–5.1)
Sodium: 136 mmol/L (ref 135–145)
Total Bilirubin: 0.8 mg/dL (ref 0.3–1.2)
Total Protein: 5.6 g/dL — ABNORMAL LOW (ref 6.5–8.1)

## 2019-07-24 LAB — CBC WITH DIFFERENTIAL/PLATELET
Abs Immature Granulocytes: 0.1 10*3/uL — ABNORMAL HIGH (ref 0.00–0.07)
Basophils Absolute: 0 10*3/uL (ref 0.0–0.1)
Basophils Relative: 0 %
Eosinophils Absolute: 0 10*3/uL (ref 0.0–0.5)
Eosinophils Relative: 0 %
HCT: 31.8 % — ABNORMAL LOW (ref 36.0–46.0)
Hemoglobin: 10.6 g/dL — ABNORMAL LOW (ref 12.0–15.0)
Immature Granulocytes: 1 %
Lymphocytes Relative: 3 %
Lymphs Abs: 0.5 10*3/uL — ABNORMAL LOW (ref 0.7–4.0)
MCH: 30.6 pg (ref 26.0–34.0)
MCHC: 33.3 g/dL (ref 30.0–36.0)
MCV: 91.9 fL (ref 80.0–100.0)
Monocytes Absolute: 0.5 10*3/uL (ref 0.1–1.0)
Monocytes Relative: 3 %
Neutro Abs: 13.7 10*3/uL — ABNORMAL HIGH (ref 1.7–7.7)
Neutrophils Relative %: 93 %
Platelets: 154 10*3/uL (ref 150–400)
RBC: 3.46 MIL/uL — ABNORMAL LOW (ref 3.87–5.11)
RDW: 13.8 % (ref 11.5–15.5)
WBC: 14.8 10*3/uL — ABNORMAL HIGH (ref 4.0–10.5)
nRBC: 0 % (ref 0.0–0.2)

## 2019-07-24 LAB — PHOSPHORUS: Phosphorus: 2.3 mg/dL — ABNORMAL LOW (ref 2.5–4.6)

## 2019-07-24 LAB — PROCALCITONIN: Procalcitonin: 0.49 ng/mL

## 2019-07-24 LAB — MAGNESIUM: Magnesium: 2.3 mg/dL (ref 1.7–2.4)

## 2019-07-24 MED ORDER — SODIUM PHOSPHATES 45 MMOLE/15ML IV SOLN
30.0000 mmol | Freq: Once | INTRAVENOUS | Status: AC
  Administered 2019-07-24: 30 mmol via INTRAVENOUS
  Filled 2019-07-24: qty 10

## 2019-07-24 NOTE — Plan of Care (Signed)
?  Problem: Clinical Measurements: ?Goal: Ability to maintain clinical measurements within normal limits will improve ?Outcome: Progressing ?  ?Problem: Clinical Measurements: ?Goal: Will remain free from infection ?Outcome: Progressing ?  ?Problem: Clinical Measurements: ?Goal: Respiratory complications will improve ?Outcome: Progressing ?  ?

## 2019-07-24 NOTE — Progress Notes (Signed)
PROGRESS NOTE    Kimberly Wang  ZCH:[id number] DOB: January 25, 1931 DOA: 07/22/2019 PCP: [name], MD     Brief Narrative:  Kimberly Wang  is a 23 WF PMHx Dementia, CAD, HTN, MI, coagulation defect  who presented to the emergency room with acute onset of altered mental status with generalized weakness and confusion and significant diminish p.o. intake especially today.  She vomited once.  She was having low-grade fever without chills.  She denied any abdominal pain.  No significant dysuria, hematuria or urgency or frequency or flank pain.  No cough or wheezing or dyspnea.  Upon presentation to the emergency room, temperature was 100.2 and heart rate 101 with otherwise normal vital signs.  CMP revealed hyponatremia and hypokalemia as well as hypochloremia with blood glucose of 223, BUN of 18 and creatinine 1.14, albumin of 3.4.  Lactic acid was 3.8 and later 3.7.  High-sensitivity troponin I was 60.  CBC showed leukocytosis of 25.1 with neutrophilia.  Urinalysis showed 6-10 WBCs with positive nitrite and trace leukocytes.  Blood cultures were drawn and urine culture was sent.  Abdominal and pelvic CT scan revealed paraesophageal hernia with no acute findings.  Chest x-ray showed bilateral small pleural effusions with mild cardiomegaly.  The patient was given 1 g of p.o. Tylenol, a gram of IV cefepime and a gram of IV vancomycin, 1 L of lactated Ringer, 10 medical IV potassium chloride and 40 mEq p.o. as well as 1 L IV normal saline.  She will be admitted to the progressive unit bed for further evaluation and management.   Subjective: 6/1 afebrile overnight A/O x1 (does not know where, when, why).  Pleasantly confused.  Follows commands.  Negative CP, negative S OB, negative abdominal pain   Assessment & Plan:   Active Problems:   Coagulation defect (HCC)   Sepsis due to gram-negative UTI (HCC)   Severe sepsis (HCC)   Dementia due to Parkinson's disease without behavioral disturbance  (HCC)   Benign essential HTN   CAD (coronary artery disease)  Severe sepsis/ positive E. coli UTI -HR> 90, WBC> 12, lactic acid> 2 -Complete 5-day course of antibiotics -Normal saline 96ml/hr -6/2 change to oral antibiotics and discharge home to complete as outpatient  Dementia/Acute Metabolic encephalopathy. -Multifactorial to include severe sepsis, hyponatremia.  Son present able to give accurate baseline -son states currently near baseline. -Neurochecks q 4 hrs -Aricept 10 mg daily -Memantine 10 mg daily -Minimize sedating medication  Hyponatremia -Multifactorial volume depletion, SSRI, anorexia. -Correct underlying factors. -See severe sepsis -Zoloft 50 mg daily (hold) -Megace 400 mg daily ,  Elevated troponin high-sensitivity/demand ischemia Results for GIULIA, HICKEY (MRN [id number]) as of 07/22/2019 14:05  Ref. Range 07/21/2019 22:51 07/22/2019 00:38  Troponin I (High Sensitivity) Latest Ref Range: <18 ng/L 60 (H) 64 (H)  -Not consistent with ACS.  Most consistent with demand ischemia.  Essential HTN -Cardizem 240 mg daily  Dyslipidemia -Lipitor 80 mg daily -5/31 LDL= 38  Coagulation defect -Apixaban 5 mg BID  Hypokalemia -Potassium level> 4   Hypomagnesmia -Potassium goal> 2   Hypophosphatemia -Goal> 2.5 -Sodium phosphate 30 mmol     DVT prophylaxis: Apixaban Code Status: Full Family Communication: 6/1 niece present at bedside for discussion of plan of care all questions answered Disposition Plan:  Status is: Inpatient    Dispo: The patient is from: SNF              Anticipated d/c is to: SNF  Anticipated d/c date is: 7 June              Patient currently unstable      Consultants:  None   Procedures/Significant Events:  5/29 CXR;-small bilateral pleural effusions. -. Mild cardiomegaly. -Slightly prominent left perihilar region may be secondary to a dilated pulmonary artery or lymphadenopathy 5/30 CT abdomen pelvis W0  contrast; diverticulosis without diverticulitis.  Moderate paraesophageal hernia     I have personally reviewed and interpreted all radiology studies and my findings are as above.  VENTILATOR SETTINGS: Room air 5/31 SPO2; 96%   Cultures 5/29 blood LEFT AC NGTD 5/29 blood RIGHT arm NGTD 5/30 urine positive E. coli    Antimicrobials: Anti-infectives (From admission, onward)   Start     Dose/Rate Stop   07/22/19 0900  cefTRIAXone (ROCEPHIN) 1 g in sodium chloride 0.9 % 100 mL IVPB     1 g 200 mL/hr over 30 Minutes     07/22/19 0145  vancomycin (VANCOCIN) IVPB 1000 mg/200 mL premix     1,000 mg 200 mL/hr over 60 Minutes 07/22/19 0349   07/22/19 0045  ceFEPIme (MAXIPIME) 1 g in sodium chloride 0.9 % 100 mL IVPB     1 g 200 mL/hr over 30 Minutes 07/22/19 0202       Devices    LINES / TUBES:      Continuous Infusions:  sodium chloride 50 mL/hr at 07/23/19 0300   cefTRIAXone (ROCEPHIN)  IV 1 g (07/24/19 4627)     Objective: Vitals:   07/23/19 2100 07/24/19 0555 07/24/19 0745 07/24/19 0817  BP:  133/68 128/87 (!) 126/57  Pulse:  74 88 87  Resp: (!) 25  20 17   Temp:  97.6 F (36.4 C) 98.1 F (36.7 C) 97.6 F (36.4 C)  TempSrc:  Oral Oral Oral  SpO2:  97% 97% 100%  Weight:      Height:        Intake/Output Summary (Last 24 hours) at 07/24/2019 0933 Last data filed at 07/23/2019 1700 Gross per 24 hour  Intake 480 ml  Output 300 ml  Net 180 ml   Filed Weights   07/21/19 2136 07/23/19 0500  Weight: 72.6 kg 72.7 kg    Examination:  General: A/O x1 (does not know where, when, why), no acute respiratory distress Eyes: negative scleral hemorrhage, negative anisocoria, negative icterus ENT: Negative Runny nose, negative gingival bleeding, Neck:  Negative scars, masses, torticollis, lymphadenopathy, JVD Lungs: Clear to auscultation bilaterally without wheezes or crackles Cardiovascular: Regular rate and rhythm without murmur gallop or rub normal S1 and  S2 Abdomen: negative abdominal pain, nondistended, positive soft, bowel sounds, no rebound, no ascites, no appreciable mass Extremities: No significant cyanosis, clubbing, or edema bilateral lower extremities Skin: Negative rashes, lesions, ulcers Psychiatric:  Negative depression, negative anxiety, negative fatigue, negative mania  Central nervous system:  Cranial nerves II through XII intact, tongue/uvula midline, all extremities muscle strength 5/5, sensation intact throughout,  negative dysarthria, negative expressive aphasia, negative receptive aphasia.  .     Data Reviewed: Care during the described time interval was provided by me .  I have reviewed this patient's available data, including medical history, events of note, physical examination, and all test results as part of my evaluation.  CBC: Recent Labs  Lab 07/21/19 2251 07/22/19 1214 07/23/19 0433 07/24/19 0528  WBC 25.1* 15.7* 14.3* 14.8*  NEUTROABS 23.3* 14.2* 12.7* 13.7*  HGB 13.8 12.3 11.8* 10.6*  HCT 40.6 35.9* 36.0 31.8*  MCV 91.9 89.3 92.5 91.9  PLT 157 134* 143* 045   Basic Metabolic Panel: Recent Labs  Lab 07/21/19 2251 07/22/19 0038 07/22/19 0646 07/22/19 1214 07/23/19 0433 07/24/19 0528  NA 130*  --  134* 134* 135 136  K 2.8*  --  3.4* 3.5 4.7 4.7  CL 94*  --  99 101 103 105  CO2 23  --  24 25 24 22   GLUCOSE 223*  --  119* 121* 119* 140*  BUN 18  --  18 17 16 23   CREATININE 1.14*  --  0.92 0.79 0.63 0.64  CALCIUM 8.7*  --  8.2* 8.3* 8.5* 8.6*  MG  --  1.7  --  1.6* 2.3 2.3  PHOS  --   --   --   --  1.7* 2.3*   GFR: Estimated Creatinine Clearance: 49.3 mL/min (by C-G formula based on SCr of 0.64 mg/dL). Liver Function Tests: Recent Labs  Lab 07/21/19 2251 07/23/19 0433 07/24/19 0528  AST 38 40 35  ALT 28 27 29   ALKPHOS 126 96 85  BILITOT 1.2 1.0 0.8  PROT 7.3 5.9* 5.6*  ALBUMIN 3.4* 2.8* 2.5*   No results for input(s): LIPASE, AMYLASE in the last 168 hours. No results for input(s):  AMMONIA in the last 168 hours. Coagulation Profile: Recent Labs  Lab 07/21/19 2208 07/22/19 0646  INR 1.6* 1.5*   Cardiac Enzymes: No results for input(s): CKTOTAL, CKMB, CKMBINDEX, TROPONINI in the last 168 hours. BNP (last 3 results) No results for input(s): PROBNP in the last 8760 hours. HbA1C: No results for input(s): HGBA1C in the last 72 hours. CBG: No results for input(s): GLUCAP in the last 168 hours. Lipid Profile: Recent Labs    07/23/19 0433  CHOL 72  HDL 17*  LDLCALC 38  TRIG 86  CHOLHDL 4.2   Thyroid Function Tests: No results for input(s): TSH, T4TOTAL, FREET4, T3FREE, THYROIDAB in the last 72 hours. Anemia Panel: No results for input(s): VITAMINB12, FOLATE, FERRITIN, TIBC, IRON, RETICCTPCT in the last 72 hours. Sepsis Labs: Recent Labs  Lab 07/21/19 2251 07/22/19 0646 07/22/19 1214 07/22/19 1855 07/23/19 0433 07/23/19 1342 07/23/19 1638 07/24/19 0528  PROCALCITON  --  1.11 0.95  --  0.68  --   --  0.49  LATICACIDVEN   < >  --  2.2* 2.1*  --  4.1* 2.8*  --    < > = values in this interval not displayed.    Recent Results (from the past 240 hour(s))  Culture, blood (Routine x 2)     Status: None (Preliminary result)   Collection Time: 07/21/19 10:07 PM   Specimen: BLOOD  Result Value Ref Range Status   Specimen Description BLOOD LEFT ANTECUBITAL  Final   Special Requests   Final    BOTTLES DRAWN AEROBIC AND ANAEROBIC Blood Culture results may not be optimal due to an excessive volume of blood received in culture bottles   Culture   Final    NO GROWTH 3 DAYS Performed at [facility], [address] [place], [place] [address]    Report Status PENDING  Incomplete  Culture, blood (Routine x 2)     Status: None (Preliminary result)   Collection Time: 07/21/19 10:51 PM   Specimen: BLOOD  Result Value Ref Range Status   Specimen Description BLOOD BLOOD RIGHT ARM  Final   Special Requests   Final    BOTTLES DRAWN AEROBIC AND ANAEROBIC  Blood Culture adequate volume  Culture   Final    NO GROWTH 3 DAYS Performed at [facility], [address] [place], [place] [address]    Report Status PENDING  Incomplete  SARS Coronavirus 2 by RT PCR (hospital order, performed in [facility] hospital lab) Nasopharyngeal Nasopharyngeal Swab     Status: None   Collection Time: 07/22/19 12:34 AM   Specimen: Nasopharyngeal Swab  Result Value Ref Range Status   SARS Coronavirus 2 NEGATIVE NEGATIVE Final    Comment: (NOTE) SARS-CoV-2 target nucleic acids are NOT DETECTED. The SARS-CoV-2 RNA is generally detectable in upper and lower respiratory specimens during the acute phase of infection. The lowest concentration of SARS-CoV-2 viral copies this assay can detect is 250 copies / mL. A negative result does not preclude SARS-CoV-2 infection and should not be used as the sole basis for treatment or other patient management decisions.  A negative result may occur with improper specimen collection / handling, submission of specimen other than nasopharyngeal swab, presence of viral mutation(s) within the areas targeted by this assay, and inadequate number of viral copies (<250 copies / mL). A negative result must be combined with clinical observations, patient history, and epidemiological information. Fact Sheet for Patients:   BoilerBrush.com.cy Fact Sheet for Healthcare Providers: https://pope.com/ This test is not yet approved or cleared  by the [place] FDA and has been authorized for detection and/or diagnosis of SARS-CoV-2 by FDA under an Emergency Use Authorization (EUA).  This EUA will remain in effect (meaning this test can be used) for the duration of the COVID-19 declaration under Section 564(b)(1) of the Act, 21 U.S.C. section 360bbb-3(b)(1), unless the authorization is terminated or revoked sooner. Performed at [facility], [address]  [place], [place] [address]   Urine Culture     Status: Abnormal (Preliminary result)   Collection Time: 07/22/19 12:54 AM   Specimen: Urine, Random  Result Value Ref Range Status   Specimen Description   Final    URINE, RANDOM Performed at [facility], [address], [place], [place] [address]    Special Requests   Final    NONE Performed at [facility], [address] [place], [place] [address]    Culture >=100,000 COLONIES/mL GRAM NEGATIVE RODS (A)  Final   Report Status PENDING  Incomplete         Radiology Studies: DG Chest 1 View  Result Date: 07/23/2019 CLINICAL DATA:  Dyspnea EXAM: CHEST  1 VIEW COMPARISON:  Chest radiograph from one day prior. FINDINGS: Stable cardiomediastinal silhouette with mild cardiomegaly. No pneumothorax. Stable small left pleural effusion. No right pleural effusion. No overt pulmonary edema. Streaky bibasilar lung opacities are similar IMPRESSION: Stable small left pleural effusion. Stable streaky bibasilar lung opacities, favor atelectasis or scarring. Stable mild cardiomegaly without overt pulmonary edema. Electronically Signed   By: Delbert Phenix M.D.   On: 07/23/2019 04:50   DG Chest Port 1 View  Result Date: 07/22/2019 CLINICAL DATA:  Shortness of breath. EXAM: PORTABLE CHEST 1 VIEW COMPARISON:  Jul 21, 2019 FINDINGS: Cardiomegaly. Skin fold over the lateral right chest. The hila and mediastinum are normal. No pneumothorax. No nodules or masses. No focal infiltrates. IMPRESSION: No active disease. Electronically Signed   By: Gerome Sam III M.D   On: 07/22/2019 15:12        Scheduled Meds:  apixaban  5 mg Oral BID   atorvastatin  80 mg Oral Daily   diltiazem  240 mg Oral Daily  donepezil  10 mg Oral QHS   ferrous sulfate  325 mg Oral Q breakfast   megestrol  400 mg Oral Daily   memantine  10 mg Oral Daily   multivitamin with minerals  1 tablet Oral Daily   pneumococcal 23 valent vaccine  0.5 mL  Intramuscular Tomorrow-1000   potassium chloride  40 mEq Oral Once   potassium chloride SA  20 mEq Oral Daily   Continuous Infusions:  sodium chloride 50 mL/hr at 07/23/19 0300   cefTRIAXone (ROCEPHIN)  IV 1 g (07/24/19 0833)     LOS: 2 days    Time spent:40 min    Lori Liew, Roselind MessierURTIS J, MD [facility] Pager [phone]  If 7PM-7AM, please contact night-coverage [email or web address] Password [email or web address] 07/24/2019, 9:33 AM

## 2019-07-25 DIAGNOSIS — B9620 Unspecified Escherichia coli [E. coli] as the cause of diseases classified elsewhere: Secondary | ICD-10-CM

## 2019-07-25 DIAGNOSIS — R531 Weakness: Secondary | ICD-10-CM

## 2019-07-25 LAB — CBC WITH DIFFERENTIAL/PLATELET
Abs Immature Granulocytes: 0.11 10*3/uL — ABNORMAL HIGH (ref 0.00–0.07)
Basophils Absolute: 0 10*3/uL (ref 0.0–0.1)
Basophils Relative: 0 %
Eosinophils Absolute: 0.3 10*3/uL (ref 0.0–0.5)
Eosinophils Relative: 2 %
HCT: 28.3 % — ABNORMAL LOW (ref 36.0–46.0)
Hemoglobin: 9.2 g/dL — ABNORMAL LOW (ref 12.0–15.0)
Immature Granulocytes: 1 %
Lymphocytes Relative: 10 %
Lymphs Abs: 1.2 10*3/uL (ref 0.7–4.0)
MCH: 30.3 pg (ref 26.0–34.0)
MCHC: 32.5 g/dL (ref 30.0–36.0)
MCV: 93.1 fL (ref 80.0–100.0)
Monocytes Absolute: 0.6 10*3/uL (ref 0.1–1.0)
Monocytes Relative: 5 %
Neutro Abs: 9.8 10*3/uL — ABNORMAL HIGH (ref 1.7–7.7)
Neutrophils Relative %: 82 %
Platelets: 180 10*3/uL (ref 150–400)
RBC: 3.04 MIL/uL — ABNORMAL LOW (ref 3.87–5.11)
RDW: 14.1 % (ref 11.5–15.5)
WBC: 11.9 10*3/uL — ABNORMAL HIGH (ref 4.0–10.5)
nRBC: 0 % (ref 0.0–0.2)

## 2019-07-25 LAB — COMPREHENSIVE METABOLIC PANEL
ALT: 33 U/L (ref 0–44)
AST: 31 U/L (ref 15–41)
Albumin: 2.3 g/dL — ABNORMAL LOW (ref 3.5–5.0)
Alkaline Phosphatase: 69 U/L (ref 38–126)
Anion gap: 7 (ref 5–15)
BUN: 40 mg/dL — ABNORMAL HIGH (ref 8–23)
CO2: 22 mmol/L (ref 22–32)
Calcium: 8.5 mg/dL — ABNORMAL LOW (ref 8.9–10.3)
Chloride: 113 mmol/L — ABNORMAL HIGH (ref 98–111)
Creatinine, Ser: 0.75 mg/dL (ref 0.44–1.00)
GFR calc Af Amer: >60 mL/min (ref >60)
GFR calc non Af Amer: >60 mL/min (ref >60)
Glucose, Bld: 121 mg/dL — ABNORMAL HIGH (ref 70–99)
Potassium: 4.5 mmol/L (ref 3.5–5.1)
Sodium: 142 mmol/L (ref 135–145)
Total Bilirubin: 0.4 mg/dL (ref 0.3–1.2)
Total Protein: 5 g/dL — ABNORMAL LOW (ref 6.5–8.1)

## 2019-07-25 LAB — URINE CULTURE: Culture: >=100000 — AB

## 2019-07-25 LAB — PHOSPHORUS: Phosphorus: 3.3 mg/dL (ref 2.5–4.6)

## 2019-07-25 LAB — MAGNESIUM: Magnesium: 2.4 mg/dL (ref 1.7–2.4)

## 2019-07-25 LAB — PROCALCITONIN: Procalcitonin: 0.54 ng/mL

## 2019-07-25 MED ORDER — CEPHALEXIN 500 MG PO CAPS
500.0000 mg | ORAL_CAPSULE | Freq: Four times a day (QID) | ORAL | Status: AC
  Administered 2019-07-26 – 2019-07-28 (×11): 500 mg via ORAL
  Filled 2019-07-25 (×11): qty 1

## 2019-07-25 MED ORDER — FUROSEMIDE 10 MG/ML IJ SOLN
20.0000 mg | Freq: Once | INTRAMUSCULAR | Status: AC
  Administered 2019-07-25: 20 mg via INTRAVENOUS
  Filled 2019-07-25: qty 2

## 2019-07-25 NOTE — Progress Notes (Signed)
Patient has some significant wheezing with shortness of breath with exertion upon movement in bed and getting up to the bedside commode.She also some leg edema, so MD was notified of this since pt has fluids infusing at 50 ml/hr. Awaiting for response from MD. IV Abx complete and fluids stopped.

## 2019-07-25 NOTE — Care Management Important Message (Signed)
Important Message  Patient Details  Name: Kimberly Wang MRN: [id number] Date of Birth: August 31, 1930   Medicare Important Message Given:  Yes     [name] 07/25/2019, 11:12 AM

## 2019-07-25 NOTE — Progress Notes (Signed)
PROGRESS NOTE    Kimberly Wang  KZS:[id number] DOB: 08-Oct-1930 DOA: 07/22/2019 PCP: [name], MD   Brief Narrative:  HPI per Dr. Valente David on 07/22/2019 Kimberly Wang  is a 86 y.o. female with a known history of coronary artery disease, hypertension and dementia, who presented to the emergency room with acute onset of altered mental status with generalized weakness and confusion and significant diminish p.o. intake especially today.  She vomited once.  She was having low-grade fever without chills.  She denied any abdominal pain.  No significant dysuria, hematuria or urgency or frequency or flank pain.  No cough or wheezing or dyspnea.  Upon presentation to the emergency room, temperature was 100.2 and heart rate 101 with otherwise normal vital signs.  CMP revealed hyponatremia and hypokalemia as well as hypochloremia with blood glucose of 223, BUN of 18 and creatinine 1.14, albumin of 3.4.  Lactic acid was 3.8 and later 3.7.  High-sensitivity troponin I was 60.  CBC showed leukocytosis of 25.1 with neutrophilia.  Urinalysis showed 6-10 WBCs with positive nitrite and trace leukocytes.  Blood cultures were drawn and urine culture was sent.  Abdominal and pelvic CT scan revealed paraesophageal hernia with no acute findings.  Chest x-ray showed bilateral small pleural effusions with mild cardiomegaly.  The patient was given 1 g of p.o. Tylenol, a gram of IV cefepime and a gram of IV vancomycin, 1 L of lactated Ringer, 10 medical IV potassium chloride and 40 mEq p.o. as well as 1 L IV normal saline.  She will be admitted to the progressive unit bed for further evaluation and management.  **Interim History Remains pleasantly demented but will need PT OT to further evaluate and treat.  Antibiotics have been changed to p.o.  She was wheezing a little bit her IV fluids have been stopped and she is given a dose of IV Lasix.  We will continue to monitor respiratory status carefully and repeat blood  work in a.m. to follow-up with PT OT recommendations.  Assessment & Plan:   Active Problems:   Coagulation defect (HCC)   Sepsis due to gram-negative UTI (HCC)   Severe sepsis (HCC)   Dementia due to Parkinson's disease without behavioral disturbance (HCC)   Benign essential HTN   CAD (coronary artery disease)  Severe sepsis/ positive E. coli UTI -HR> 90, WBC> 12, lactic acid> 2 -Complete 5-day course of antibiotics -STOP Normal saline 94ml/hr given her Wheezing and Dyspnea -Give 1x of IV Lasix and she has breathing treatments -Repeat chest x-ray in a.m. -6/2 change to oral antibiotics to po Keflex -PT/OT to further evaluate and Treat; OT done recommending home health OT still need PT evaluation if family cannot look after her 24/7 then they recommend SNF for increased safety and functional strengthening before returning home -WBC is improving and went from 14.8 is now 11.9 -Patient procalcitonin level has been intermittently elevated and ranging from 0.68 and is trended down to 0.49 is now 0.54 -lactic acid level was 2.1 and went up to 4.1 is now down to 2.8  -repeat lactic acid level in the a.m. -We will discuss with the [facility] team and if she will go home she can likely go home in the next 24 to 48 hours versus SNF  AcuteMetabolic Encephalopathy superimposed on chronic dementia -Multifactorial to include severe sepsis, hyponatremia.  Son present able to give accurate baseline -yesterday son states currently near baseline. -C/w Neurochecks q 4 hrs -Aricept 10 mg daily -C/w Memantine 10  mg daily and continue with quetiapine 25 mg p.o. nightly as needed for agitation and sleep -Continue with delirium precautions -Minimize sedating medication  Hyponatremia -Multifactorial volume depletion, SSRI, anorexia. -Correct underlying factors -IVF now stopped and will be given a dose of IV Lasix 20 mg x1 -Zoloft 50 mg daily (hold) -Megace 400 mg daily  Elevated troponin  high-sensitivity/demand ischemia -Troponin was 60 and repeat was 64 -Not consistent with ACS.  Most consistent with demand ischemia. -Not complaining of any Chest Pain -Continue with a apixaban 5 mg p.o. twice daily, atorvastatin 80 mg p.o. daily, and nitroglycerin 0.4 mg sublingual every 5 minutes as needed chest pain  Essential HTN -C/w Cardizem 240 mg daily  Dyslipidemia -C/w Lipitor 80 mg daily -Lipid panel done showed a total cholesterol/HDL ratio of 4.2, cholesterol of 172, HDL level of 17, LDL 38, triglycerides of 86 and VLDL of 17  Coagulation defect -Continue with apixaban 5 mg BID  Hypokalemia -Goal of potassium level> 4 -Potassium today is 4.5 next-continue to monitor and replete as necessary -Repeat CMP in a.m.  Hypomagnesemia -Mag Level was 2.4 -Continue to monitor replete as necessary -Repeat CBC in a.m.  Hypophosphatemia -Goal> 2.5 -Patient's phosphorus is now 3.3  -Continue to monitor and treat as necessary  DVT prophylaxis: SCDs; Anticoagulated with Apxiaban  Code Status: FULL CODE Family Communication: No family present at bedside  Disposition Plan: Further evaluation by PT OT for safe discharge disposition.  Her antibiotics have been now changed to p.o.  Status is: Inpatient  Remains inpatient appropriate because:Unsafe d/c plan and Inpatient level of care appropriate due to severity of illness   Dispo: The patient is from: Home              Anticipated d/c is to: TBD; SNF vs. Home Health PT/OT              Anticipated d/c date is: 1 day              Patient currently is not medically stable to d/c.  Consultants:   None   Procedures: None  Antimicrobials:  Anti-infectives (From admission, onward)   Start     Dose/Rate Route Frequency Ordered Stop   07/26/19 0600  cephALEXin (KEFLEX) capsule 500 mg     500 mg Oral Every 6 hours 07/25/19 1718 07/29/19 0559   07/22/19 0900  cefTRIAXone (ROCEPHIN) 1 g in sodium chloride 0.9 % 100 mL IVPB   Status:  Discontinued     1 g 200 mL/hr over 30 Minutes Intravenous Every 24 hours 07/22/19 0248 07/25/19 1714   07/22/19 0145  vancomycin (VANCOCIN) IVPB 1000 mg/200 mL premix     1,000 mg 200 mL/hr over 60 Minutes Intravenous  Once 07/22/19 0144 07/22/19 0349   07/22/19 0045  ceFEPIme (MAXIPIME) 1 g in sodium chloride 0.9 % 100 mL IVPB     1 g 200 mL/hr over 30 Minutes Intravenous  Once 07/22/19 0039 07/22/19 0202     Subjective: Seen and examined at bedside and she is pleasantly demented.  Not complaining of anything but she was short of breath little bit and had some wheezing.  Fluids have been stopped.  Given a dose of IV Lasix.  No family currently bedside.  Will await PT OT for safe discharge disposition.  No other concerns or complaints this time.  Objective: Vitals:   07/25/19 0406 07/25/19 0804 07/25/19 1225 07/25/19 1630  BP: 138/76 135/86 128/74 117/76  Pulse: 69 76 74 67  Resp:  16 20 18   Temp: 97.7 F (36.5 C) (!) 97.4 F (36.3 C) 98.7 F (37.1 C)   TempSrc: Oral Oral    SpO2: 98% 99% 98% 98%  Weight: 74.5 kg     Height:        Intake/Output Summary (Last 24 hours) at 07/25/2019 1754 Last data filed at 07/25/2019 1021 Gross per 24 hour  Intake 2009.53 ml  Output 800 ml  Net 1209.53 ml   Filed Weights   07/21/19 2136 07/23/19 0500 07/25/19 0406  Weight: 72.6 kg 72.7 kg 74.5 kg   Examination: Physical Exam:  Constitutional: WN/WD overweight elderly pleasantly demented Caucasian female currently in NAD and appears calm Eyes: Lids and conjunctivae normal, sclerae anicteric  ENMT: External Ears, Nose appear normal. Grossly normal hearing.  Neck: Appears normal, supple, no cervical masses, normal ROM, no appreciable thyromegaly; no JVD Respiratory: Diminished to auscultation bilaterally with some coarse breath sounds and some wheezing.  Slightly increased respiratory rate but has unlabored breathing and is not wearing any supplemental oxygen via nasal  cannula Cardiovascular: RRR, no murmurs / rubs / gallops. S1 and S2 auscultated.  Minimal extremity edema.  Abdomen: Soft, non-tender, distended secondary body habitus. Bowel sounds positive.  GU: Deferred. Musculoskeletal: No clubbing / cyanosis of digits/nails. No joint deformity upper and lower extremities.  Skin: No rashes, lesions, ulcers on limited skin evaluation. No induration; Warm and dry.  Neurologic: CN 2-12 grossly intact with no focal deficits. Romberg sign cerebellar reflexes not assessed.  Psychiatric: Impaired judgment and insight.  She is alert but not oriented x 3. Normal mood and appropriate affect.   Data Reviewed: I have personally reviewed following labs and imaging studies  CBC: Recent Labs  Lab 07/21/19 2251 07/22/19 1214 07/23/19 0433 07/24/19 0528 07/25/19 0527  WBC 25.1* 15.7* 14.3* 14.8* 11.9*  NEUTROABS 23.3* 14.2* 12.7* 13.7* 9.8*  HGB 13.8 12.3 11.8* 10.6* 9.2*  HCT 40.6 35.9* 36.0 31.8* 28.3*  MCV 91.9 89.3 92.5 91.9 93.1  PLT 157 134* 143* 154 180   Basic Metabolic Panel: Recent Labs  Lab 07/21/19 2251 07/22/19 0038 07/22/19 0646 07/22/19 1214 07/23/19 0433 07/24/19 0528 07/25/19 0527  NA   < >  --  134* 134* 135 136 142  K   < >  --  3.4* 3.5 4.7 4.7 4.5  CL   < >  --  99 101 103 105 113*  CO2   < >  --  24 25 24 22 22   GLUCOSE   < >  --  119* 121* 119* 140* 121*  BUN   < >  --  18 17 16 23  40*  CREATININE   < >  --  0.92 0.79 0.63 0.64 0.75  CALCIUM   < >  --  8.2* 8.3* 8.5* 8.6* 8.5*  MG  --  1.7  --  1.6* 2.3 2.3 2.4  PHOS  --   --   --   --  1.7* 2.3* 3.3   < > = values in this interval not displayed.   GFR: Estimated Creatinine Clearance: 49.9 mL/min (by C-G formula based on SCr of 0.75 mg/dL). Liver Function Tests: Recent Labs  Lab 07/21/19 2251 07/23/19 0433 07/24/19 0528 07/25/19 0527  AST 38 40 35 31  ALT 28 27 29  33  ALKPHOS 126 96 85 69  BILITOT 1.2 1.0 0.8 0.4  PROT 7.3 5.9* 5.6* 5.0*  ALBUMIN 3.4* 2.8* 2.5*  2.3*   No results for  input(s): LIPASE, AMYLASE in the last 168 hours. No results for input(s): AMMONIA in the last 168 hours. Coagulation Profile: Recent Labs  Lab 07/21/19 2208 07/22/19 0646  INR 1.6* 1.5*   Cardiac Enzymes: No results for input(s): CKTOTAL, CKMB, CKMBINDEX, TROPONINI in the last 168 hours. BNP (last 3 results) No results for input(s): PROBNP in the last 8760 hours. HbA1C: No results for input(s): HGBA1C in the last 72 hours. CBG: No results for input(s): GLUCAP in the last 168 hours. Lipid Profile: Recent Labs    07/23/19 0433  CHOL 72  HDL 17*  LDLCALC 38  TRIG 86  CHOLHDL 4.2   Thyroid Function Tests: No results for input(s): TSH, T4TOTAL, FREET4, T3FREE, THYROIDAB in the last 72 hours. Anemia Panel: No results for input(s): VITAMINB12, FOLATE, FERRITIN, TIBC, IRON, RETICCTPCT in the last 72 hours. Sepsis Labs: Recent Labs  Lab 07/22/19 1214 07/22/19 1855 07/23/19 0433 07/23/19 1342 07/23/19 1638 07/24/19 0528 07/25/19 0527  PROCALCITON 0.95  --  0.68  --   --  0.49 0.54  LATICACIDVEN 2.2* 2.1*  --  4.1* 2.8*  --   --     Recent Results (from the past 240 hour(s))  Culture, blood (Routine x 2)     Status: None (Preliminary result)   Collection Time: 07/21/19 10:07 PM   Specimen: BLOOD  Result Value Ref Range Status   Specimen Description BLOOD LEFT ANTECUBITAL  Final   Special Requests   Final    BOTTLES DRAWN AEROBIC AND ANAEROBIC Blood Culture results may not be optimal due to an excessive volume of blood received in culture bottles   Culture   Final    NO GROWTH 4 DAYS Performed at [facility], [address] [place], [place] [address]    Report Status PENDING  Incomplete  Culture, blood (Routine x 2)     Status: None (Preliminary result)   Collection Time: 07/21/19 10:51 PM   Specimen: BLOOD  Result Value Ref Range Status   Specimen Description BLOOD BLOOD RIGHT ARM  Final   Special Requests   Final    BOTTLES  DRAWN AEROBIC AND ANAEROBIC Blood Culture adequate volume   Culture   Final    NO GROWTH 4 DAYS Performed at [facility], [address], [place], [place] [address]    Report Status PENDING  Incomplete  SARS Coronavirus 2 by RT PCR (hospital order, performed in [facility] hospital lab) Nasopharyngeal Nasopharyngeal Swab     Status: None   Collection Time: 07/22/19 12:34 AM   Specimen: Nasopharyngeal Swab  Result Value Ref Range Status   SARS Coronavirus 2 NEGATIVE NEGATIVE Final    Comment: (NOTE) SARS-CoV-2 target nucleic acids are NOT DETECTED. The SARS-CoV-2 RNA is generally detectable in upper and lower respiratory specimens during the acute phase of infection. The lowest concentration of SARS-CoV-2 viral copies this assay can detect is 250 copies / mL. A negative result does not preclude SARS-CoV-2 infection and should not be used as the sole basis for treatment or other patient management decisions.  A negative result may occur with improper specimen collection / handling, submission of specimen other than nasopharyngeal swab, presence of viral mutation(s) within the areas targeted by this assay, and inadequate number of viral copies (<250 copies / mL). A negative result must be combined with clinical observations, patient history, and epidemiological information. Fact Sheet for Patients:   BoilerBrush.com.cy Fact Sheet for Healthcare Providers: https://pope.com/ This test is not yet approved or cleared  by the [place] FDA and has been authorized for detection and/or diagnosis of SARS-CoV-2 by FDA under an Emergency Use Authorization (EUA).  This EUA will remain in effect (meaning this test can be used) for the duration of the COVID-19 declaration under Section 564(b)(1) of the Act, 21 U.S.C. section 360bbb-3(b)(1), unless the authorization is terminated or revoked sooner. Performed at [facility],  [address] [place], [place] [address]   Urine Culture     Status: Abnormal   Collection Time: 07/22/19 12:54 AM   Specimen: Urine, Random  Result Value Ref Range Status   Specimen Description   Final    URINE, RANDOM Performed at [facility], [address] [place], [place] [address]    Special Requests   Final    NONE Performed at [facility], [address] [place], [place] [address]    Culture >=100,000 COLONIES/mL ESCHERICHIA COLI (A)  Final   Report Status 07/25/2019 FINAL  Final   Organism ID, Bacteria ESCHERICHIA COLI (A)  Final      Susceptibility   Escherichia coli - MIC*    AMPICILLIN >=32 RESISTANT Resistant     CEFAZOLIN <=4 SENSITIVE Sensitive     CEFTRIAXONE <=1 SENSITIVE Sensitive     CIPROFLOXACIN 0.5 SENSITIVE Sensitive     GENTAMICIN <=1 SENSITIVE Sensitive     IMIPENEM <=0.25 SENSITIVE Sensitive     NITROFURANTOIN <=16 SENSITIVE Sensitive     TRIMETH/SULFA >=320 RESISTANT Resistant     AMPICILLIN/SULBACTAM 4 SENSITIVE Sensitive     PIP/TAZO <=4 SENSITIVE Sensitive     * >=100,000 COLONIES/mL ESCHERICHIA COLI     RN Pressure Injury Documentation:     Estimated body mass index is 28.18 kg/m as calculated from the following:   Height as of this encounter: 5\' 4"  (1.626 m).   Weight as of this encounter: 74.5 kg.  Malnutrition Type:      Malnutrition Characteristics:      Nutrition Interventions:     Radiology Studies: No results found.  Scheduled Meds:  apixaban  5 mg Oral BID   atorvastatin  80 mg Oral Daily   [START ON 07/26/2019] cephALEXin  500 mg Oral Q6H   diltiazem  240 mg Oral Daily   donepezil  10 mg Oral QHS   ferrous sulfate  325 mg Oral Q breakfast   furosemide  20 mg Intravenous Once   megestrol  400 mg Oral Daily   memantine  10 mg Oral Daily   multivitamin with minerals  1 tablet Oral Daily   potassium chloride  40 mEq Oral Once   potassium chloride SA  20 mEq Oral Daily    Continuous Infusions:   LOS: 3 days   Merlene Laughtermair Latif Braxton Weisbecker, DO [facility] PAGER is on AMION  If 7PM-7AM, please contact night-coverage [email or web address]

## 2019-07-25 NOTE — TOC Initial Note (Signed)
Transition of Care [facility]) - Initial/Assessment Note    Patient Details  Name: Kimberly Wang MRN: [id number] Date of Birth: 10/04/1930  [facility] CM/SW Contact:    Trenton Founds, RN Phone Number: 07/25/2019, 1:16 PM  Clinical Narrative:    RNCM placed call to patient's DIL Amy to discuss patient's needs. Amy reports that she is married to patient's son and she is the primary caregiver for patient. She reports that prior to hospitalization patient lived in independent living apartment and they checked in on her daily, assisted with adl's and made sure her meals were prepared. Amy however also reports that since patient got sick she has noticed a drastic decline in patient's cognition and she is concerned about patient's disposition when she is ready to leave the hospital. Discussed that PT and OT orders will be placed to determine if patient would be eligible for a higher level of care. Amy verbalizes that they would be agreeable to placement if this is what is determined to be needed. RNCM will follow for needs.               Expected Discharge Plan: Home w Home Health Services Barriers to Discharge: Continued Medical Work up   Patient Goals and CMS Choice        Expected Discharge Plan and Services Expected Discharge Plan: Home w Home Health Services   Discharge Planning Services: CM Consult   Living arrangements for the past 2 months: Independent Living Facility                                      Prior Living Arrangements/Services Living arrangements for the past 2 months: Independent Living Facility Lives with:: Self Patient language and need for interpreter reviewed:: Yes Do you feel safe going back to the place where you live?: Yes      Need for Family Participation in Patient Care: Yes (Comment) Care giver support system in place?: Yes (comment)   Criminal Activity/Legal Involvement Pertinent to Current Situation/Hospitalization: No - Comment as  needed  Activities of Daily Living Home Assistive Devices/Equipment: None ADL Screening (condition at time of admission) Patient's cognitive ability adequate to safely complete daily activities?: Yes Is the patient deaf or have difficulty hearing?: Yes Does the patient have difficulty seeing, even when wearing glasses/contacts?: No Does the patient have difficulty concentrating, remembering, or making decisions?: No Patient able to express need for assistance with ADLs?: Yes Does the patient have difficulty dressing or bathing?: No Independently performs ADLs?: Yes (appropriate for developmental age) Does the patient have difficulty walking or climbing stairs?: No Weakness of Legs: None Weakness of Arms/Hands: None  Permission Sought/Granted                  Emotional Assessment         Alcohol / Substance Use: Not Applicable Psych Involvement: No (comment)  Admission diagnosis:  Hypokalemia [E87.6] Hyponatremia [E87.1] Demand ischemia (HCC) [I24.8] AKI (acute kidney injury) (HCC) [N17.9] Sepsis due to urinary tract infection (HCC) [A41.9, N39.0] Sepsis (HCC) [A41.9] Sepsis due to gram-negative UTI (HCC) [A41.50, N39.0] Patient Active Problem List   Diagnosis Date Noted  . Sepsis due to gram-negative UTI (HCC) 07/22/2019  . Severe sepsis (HCC) 07/22/2019  . Dementia due to Parkinson's disease without behavioral disturbance (HCC) 07/22/2019  . Benign essential HTN 07/22/2019  . CAD (coronary artery disease) 07/22/2019  . Pain due  to onychomycosis of toenails of both feet 05/17/2019  . Coagulation defect ([place]) 05/17/2019   PCP:  Valera Castle, MD Pharmacy:  No Pharmacies Listed    Social Determinants of Health (SDOH) Interventions    Readmission Risk Interventions No flowsheet data found.

## 2019-07-25 NOTE — Evaluation (Signed)
Occupational Therapy Evaluation Patient Details Name: Shivangi Lutz MRN: [id number] DOB: [birth year] Today's Date: 07/25/2019    History of Present Illness Nyanna Dolson is a 86 year old female who presented with altered mental status and generalized weakness.  Pt found to have sepsis/UTI.  Pt's PMH includes Parkinson's dementia, CAD, hypertension, and history of MI.   Clinical Impression   Ms. Banwart presents to OT with impaired cognition and decreased endurance that impacts her ability to safely and independently complete functional tasks.  Pt is unreliable historian 2/2 decreased orientation and hx of dementia, but pt reports being mod I in toileting and dressing prior to admission.  Pt lives alone, but receives assistance from daughter in law to set up clothing, bathe, meal prep, medication management, and community mobility.  Currently, pt requires min assist for sit to stand transfers and min guard for functional mobility with RW.  OTR provided total A for perihygiene while pt stood at [facility] with BUE support from RW.  Pt has limited endurance, with HR increasing to 120s with activity.  Pt's HR quickly recovers to 70s with rest, SpO2 stable throughout on room air.  Pt does have wheezing and shortness of breath with activity.  Ms. Pester will continue to benefit from skilled OT services in acute setting to address cognition, endurance, and safety and independence in ADLs.  Recommend HHOT upon discharge along with 24 hour supervision for safety 2/2 pt's impaired cognition and decreased endurance.  If pt's family is unable to provide 24 hour supervision upon discharge, recommend SNF for increased safety and functional strengthening prior to returning home.    Follow Up Recommendations  Home health OT;Supervision/Assistance - 24 hour;SNF    Equipment Recommendations  3 in 1 bedside commode    Recommendations for Other Services       Precautions / Restrictions Precautions Precautions:  Fall Restrictions Weight Bearing Restrictions: No Other Position/Activity Restrictions: 1:1 PNA for safety      Mobility Bed Mobility Overal bed mobility: Needs Assistance             General bed mobility comments: not tested, pt received and left in recliner  Transfers Overall transfer level: Needs assistance Equipment used: Rolling walker (2 wheeled) Transfers: Sit to/from Stand Sit to Stand: Min assist         General transfer comment: no LOB, increased HR and SOB with activity    Balance Overall balance assessment: Needs assistance Sitting-balance support: No upper extremity supported;Feet supported Sitting balance-Leahy Scale: Good     Standing balance support: Bilateral upper extremity supported Standing balance-Leahy Scale: Fair                             ADL either performed or assessed with clinical judgement   ADL Overall ADL's : Needs assistance/impaired                                     Functional mobility during ADLs: Rolling walker;Min guard General ADL Comments: Pt is generally independent in seated ADLs including grooming, feeding, upper body dressing and bathing.  Pt requires supervision for standing ADLs including toileting and lower body dressing and bathing.  Pt generally requires supervision for OOB mobility 2/2 decreased safety awareness and impaired cognition.     Vision Baseline Vision/History: Wears glasses Wears Glasses: Reading only Patient Visual Report: No change from baseline Vision  Assessment?: No apparent visual deficits     Perception     Praxis      Pertinent Vitals/Pain Pain Assessment: No/denies pain     Hand Dominance Right   Extremity/Trunk Assessment Upper Extremity Assessment Upper Extremity Assessment: Overall WFL for tasks assessed   Lower Extremity Assessment Lower Extremity Assessment: Defer to PT evaluation       Communication Communication Communication: No  difficulties   Cognition Arousal/Alertness: Awake/alert Behavior During Therapy: WFL for tasks assessed/performed Overall Cognitive Status: No family/caregiver present to determine baseline cognitive functioning                                 General Comments: Pt appears confused at times, but is oriented and engaged in conversation.  Pt oriented to setting and situation, states month is "July" and day is "Friday"   General Comments       Exercises Other Exercises Other Exercises: provided education re: OT role and plan of care, fall and safety precautions, self care, functional mobility, toileting, RW safety and sequencing   Shoulder Instructions      Home Living Family/patient expects to be discharged to:: Private residence Living Arrangements: Alone Available Help at Discharge: Family;Available PRN/intermittently Type of Home: House       Home Layout: Two level;Able to live on main level with bedroom/bathroom(pt reports only going upstairs with family assist)     Bathroom Shower/Tub: Tub/shower unit;Walk-in shower   Bathroom Toilet: Standard     Home Equipment: Environmental consultant - 2 wheels;Shower seat          Prior Functioning/Environment Level of Independence: Needs assistance  Gait / Transfers Assistance Needed: Uses RW for household mobility ADL's / Homemaking Assistance Needed: Pt independent with dressing and toileting.  Her daughter in law provides assistance to set out clothing, as well as for bathing, household management, medication management, and meal prep   Comments: Pt is unreliable historian, needs further assessment of PLOF        OT Problem List: Decreased strength;Decreased range of motion;Decreased activity tolerance;Impaired balance (sitting and/or standing);Decreased cognition;Decreased safety awareness;Decreased knowledge of use of DME or AE;Cardiopulmonary status limiting activity      OT Treatment/Interventions: Self-care/ADL  training;Therapeutic exercise;Energy conservation;DME and/or AE instruction;Therapeutic activities;Cognitive remediation/compensation;Patient/family education;Balance training    OT Goals(Current goals can be found in the care plan section) Acute Rehab OT Goals Patient Stated Goal: to return to PLOF OT Goal Formulation: With patient Time For Goal Achievement: 08/08/19 Potential to Achieve Goals: Good  OT Frequency: Min 1X/week   Barriers to D/C: Decreased caregiver support          Co-evaluation              AM-PAC OT "6 Clicks" Daily Activity     Outcome Measure Help from another person eating meals?: None Help from another person taking care of personal grooming?: None Help from another person toileting, which includes using toliet, bedpan, or urinal?: A Little Help from another person bathing (including washing, rinsing, drying)?: A Lot Help from another person to put on and taking off regular upper body clothing?: None Help from another person to put on and taking off regular lower body clothing?: A Lot 6 Click Score: 19   End of Session Equipment Utilized During Treatment: Gait belt;Rolling walker  Activity Tolerance: Patient tolerated treatment well Patient left: in chair;with call bell/phone within reach;with chair alarm set;with nursing/sitter in room  OT  Visit Diagnosis: Unsteadiness on feet (R26.81);Other symptoms and signs involving cognitive function                Time: 1347-1435 OT Time Calculation (min): 48 min Charges:  OT General Charges $OT Visit: 1 Visit OT Evaluation $OT Eval Moderate Complexity: 1 Mod OT Treatments $Self Care/Home Management : 38-52 mins  Kathyrn Drown Donda Friedli, OTR/L 07/25/19, 3:45 PM

## 2019-07-26 ENCOUNTER — Inpatient Hospital Stay: Admit: 2019-07-26 | Payer: Medicare PPO

## 2019-07-26 ENCOUNTER — Inpatient Hospital Stay: Payer: Medicare PPO

## 2019-07-26 ENCOUNTER — Inpatient Hospital Stay (HOSPITAL_COMMUNITY)
Admit: 2019-07-26 | Discharge: 2019-07-26 | Disposition: A | Discharge status: Home / Self Care | Payer: Medicare PPO | Attending: Internal Medicine | Admitting: Internal Medicine

## 2019-07-26 DIAGNOSIS — R0600 Dyspnea, unspecified: Secondary | ICD-10-CM

## 2019-07-26 DIAGNOSIS — I5033 Acute on chronic diastolic (congestive) heart failure: Secondary | ICD-10-CM

## 2019-07-26 DIAGNOSIS — I4891 Unspecified atrial fibrillation: Secondary | ICD-10-CM

## 2019-07-26 LAB — CBC WITH DIFFERENTIAL/PLATELET
Abs Immature Granulocytes: 0.3 10*3/uL — ABNORMAL HIGH (ref 0.00–0.07)
Basophils Absolute: 0.1 10*3/uL (ref 0.0–0.1)
Basophils Relative: 0 %
Eosinophils Absolute: 0.2 10*3/uL (ref 0.0–0.5)
Eosinophils Relative: 1 %
HCT: 25.8 % — ABNORMAL LOW (ref 36.0–46.0)
Hemoglobin: 8.5 g/dL — ABNORMAL LOW (ref 12.0–15.0)
Immature Granulocytes: 2 %
Lymphocytes Relative: 11 %
Lymphs Abs: 1.7 10*3/uL (ref 0.7–4.0)
MCH: 31 pg (ref 26.0–34.0)
MCHC: 32.9 g/dL (ref 30.0–36.0)
MCV: 94.2 fL (ref 80.0–100.0)
Monocytes Absolute: 1 10*3/uL (ref 0.1–1.0)
Monocytes Relative: 6 %
Neutro Abs: 12.3 10*3/uL — ABNORMAL HIGH (ref 1.7–7.7)
Neutrophils Relative %: 80 %
Platelets: 202 10*3/uL (ref 150–400)
RBC: 2.74 MIL/uL — ABNORMAL LOW (ref 3.87–5.11)
RDW: 14.1 % (ref 11.5–15.5)
WBC: 15.6 10*3/uL — ABNORMAL HIGH (ref 4.0–10.5)
nRBC: 0.4 % — ABNORMAL HIGH (ref 0.0–0.2)

## 2019-07-26 LAB — BRAIN NATRIURETIC PEPTIDE: B Natriuretic Peptide: 125.1 pg/mL — ABNORMAL HIGH (ref 0.0–100.0)

## 2019-07-26 LAB — CULTURE, BLOOD (ROUTINE X 2)
Culture: NO GROWTH
Culture: NO GROWTH
Special Requests: ADEQUATE

## 2019-07-26 LAB — COMPREHENSIVE METABOLIC PANEL
ALT: 30 U/L (ref 0–44)
AST: 26 U/L (ref 15–41)
Albumin: 2.3 g/dL — ABNORMAL LOW (ref 3.5–5.0)
Alkaline Phosphatase: 60 U/L (ref 38–126)
Anion gap: 6 (ref 5–15)
BUN: 50 mg/dL — ABNORMAL HIGH (ref 8–23)
CO2: 25 mmol/L (ref 22–32)
Calcium: 8.5 mg/dL — ABNORMAL LOW (ref 8.9–10.3)
Chloride: 107 mmol/L (ref 98–111)
Creatinine, Ser: 0.66 mg/dL (ref 0.44–1.00)
GFR calc Af Amer: >60 mL/min (ref >60)
GFR calc non Af Amer: >60 mL/min (ref >60)
Glucose, Bld: 133 mg/dL — ABNORMAL HIGH (ref 70–99)
Potassium: 4.8 mmol/L (ref 3.5–5.1)
Sodium: 138 mmol/L (ref 135–145)
Total Bilirubin: 0.4 mg/dL (ref 0.3–1.2)
Total Protein: 4.9 g/dL — ABNORMAL LOW (ref 6.5–8.1)

## 2019-07-26 LAB — MAGNESIUM: Magnesium: 2 mg/dL (ref 1.7–2.4)

## 2019-07-26 LAB — PHOSPHORUS: Phosphorus: 2.7 mg/dL (ref 2.5–4.6)

## 2019-07-26 LAB — LACTIC ACID, PLASMA: Lactic Acid, Venous: 1 mmol/L (ref 0.5–1.9)

## 2019-07-26 MED ORDER — FUROSEMIDE 10 MG/ML IJ SOLN
20.0000 mg | Freq: Once | INTRAMUSCULAR | Status: AC
  Administered 2019-07-26: 20 mg via INTRAVENOUS
  Filled 2019-07-26: qty 2

## 2019-07-26 NOTE — Progress Notes (Signed)
*  PRELIMINARY RESULTS* Echocardiogram 2D Echocardiogram has been performed.  Kimberly Wang 07/26/2019, 6:48 PM

## 2019-07-26 NOTE — Consult Note (Signed)
Cardiology Consultation Note    Patient ID: Kimberly Wang, MRN: [id number], DOB/AGE: 1931-02-17 86 y.o. Admit date: 07/22/2019   Date of Consult: 07/26/2019 Primary Physician: [name], MD Primary Cardiologist: Jeralene Peters, [facility]  Chief Complaint: weakness Reason for Consultation: sepsis/chf Requesting MD: Dr. Marland Mcalpine  HPI: Kimberly Wang is a 86 y.o. female with history of heart failure with preserved ejection fraction, chronic atrial fibrillation, coronary artery disease, dementia who is followed by Jeralene Peters at [facility] who was admitted with weakness confusion and increasing altered mental status.  She had a cardiac MRI in 2011 showing normal RV and LV function.  There was a left atrial thrombus with no evidence of myocardial infarction.  She had a non-STEMI in 2006 with cardiac cath revealing distal LAD disease not amenable to PCI.  She had a PCI of the RCA with a Taxus stent.  This was done in 2006 earlier.  She has been on rate control with diltiazem for her A. fib.  She has been on Eliquis for anticoagulation.  She was diagnosed with both mixed Alzheimer's and vascular dementia in 2015.  Her most recent outpatient regimen included apixaban 5 mg twice daily, atorvastatin 80 mg daily, carvedilol 6.25 mg twice daily, diltiazem 240 mg daily, furosemide 80 mg twice daily, spironolactone 25 mg daily along with potassium chloride to 20 mEq daily.  She was last seen by her [facility] cardiology team approximately 1 year ago..  She had lower extremity edema but this was felt not to be due to progressive heart failure but sedentary lifestyle.  She had no clinical evidence of pulmonary edema only peripheral edema.  On presentation to the emergency room she was felt to have acute onset worsening mental status with generalized weakness confusion.  She had decreased p.o. intake.  She had low-grade fever without chills.  Temperature was 100.2 with a heart rate of 101.  BUN and creatinine  were 18 and 1.14.  Her high-sensitivity troponin was 60.  Chest x-ray revealed bilateral small pleural effusions with mild cardiomegaly.  She was given a liter of lactated Ringer's as well as a liter of normal saline.  She was felt to have sepsis due to UTI with no evidence of shock.  She had elevated lactic acid.  She was hypokalemic and hyponatremic.  BNP was 125 which is only minimally elevated.  Potassium this morning was 4.8 with a BUN and creatinine of 0.66.  EKG showed atrial fibrillation with rapid ventricular response She was continued with her home medications and placed on diuretics after 2 L of IV fluids.  She is hemodynamically stable and continues to be somewhat tachycardic.  She has a pulse ox of 97% on room air.  Per chart she is 6 L over admission.  She denies chest pain. Past Medical History:  Diagnosis Date   Coronary artery disease    Dementia (HCC)    Hypertension    Myocardial infarct [facility])       Surgical History:  Past Surgical History:  Procedure Laterality Date   CHOLECYSTECTOMY     CORONARY STENT PLACEMENT       Home Meds: Prior to Admission medications   Medication Sig Start Date End Date Taking? Authorizing Provider  atorvastatin (LIPITOR) 80 MG tablet Take by mouth. 11/21/18   [provider]  diltiazem (CARDIZEM CD) 240 MG 24 hr capsule  03/28/19   [provider]  donepezil (ARICEPT) 10 MG tablet  03/28/19   [provider]  ELIQUIS 5 MG TABS tablet  04/15/19   [provider]  FEROSUL 325 (65 Fe) MG tablet  03/28/19   [provider]  furosemide (LASIX) 80 MG tablet  03/28/19   [provider]  memantine (NAMENDA) 10 MG tablet  03/28/19   [provider]  Multiple Vitamin (MULTIVITAMIN) capsule Take by mouth.    [provider]  nitroGLYCERIN (NITROSTAT) 0.4 MG SL tablet Place under the tongue. 11/21/18   [provider]  potassium chloride SA (KLOR-CON) 20 MEQ tablet Take by mouth.  11/21/18   [provider]  sertraline (ZOLOFT) 50 MG tablet  04/15/19   [provider]  spironolactone (ALDACTONE) 25 MG tablet Take by mouth. 11/21/18   [provider]    Inpatient Medications:   apixaban  5 mg Oral BID   atorvastatin  80 mg Oral Daily   cephALEXin  500 mg Oral Q6H   diltiazem  240 mg Oral Daily   donepezil  10 mg Oral QHS   ferrous sulfate  325 mg Oral Q breakfast   megestrol  400 mg Oral Daily   memantine  10 mg Oral Daily   multivitamin with minerals  1 tablet Oral Daily   potassium chloride  40 mEq Oral Once   potassium chloride SA  20 mEq Oral Daily      Allergies:  Allergies  Allergen Reactions   Latex Hives and Rash   Lidocaine Hcl Other (See Comments)    Other Reaction: Other reaction. Patient can't take Novacaine   Codeine Other (See Comments)   Morphine Other (See Comments)   Novocain [Procaine]     Heart races   Other Other (See Comments)    Uncoded Allergy. Allergen: RED PEPPER    Social History   Socioeconomic History   Marital status: Married    Spouse name: Not on file   Number of children: Not on file   Years of education: Not on file   Highest education level: Not on file  Occupational History   Not on file  Tobacco Use   Smoking status: Never Smoker   Smokeless tobacco: Never Used  Substance and Sexual Activity   Alcohol use: Never   Drug use: Never   Sexual activity: Not on file  Other Topics Concern   Not on file  Social History Narrative   Not on file   Social Determinants of Health   Financial Resource Strain:    Difficulty of Paying Living Expenses:   Food Insecurity:    Worried About Running Out of Food in the Last Year:    Barista in the Last Year:   Transportation Needs:    Freight forwarder (Medical):    Lack of Transportation (Non-Medical):   Physical Activity:    Days of Exercise per Week:    Minutes of Exercise per Session:   Stress:    Feeling of Stress :    Social Connections:    Frequency of Communication with Friends and Family:    Frequency of Social Gatherings with Friends and Family:    Attends Religious Services:    Active Member of Clubs or Organizations:    Attends Engineer, structural:    Marital Status:   Intimate Partner Violence:    Fear of Current or Ex-Partner:    Emotionally Abused:    Physically Abused:    Sexually Abused:      History reviewed. No pertinent family history.  Review of Systems: A 12-system review of systems was performed and is negative except as noted in the HPI.  Labs: No results for input(s): CKTOTAL, CKMB, TROPONINI in the last 72 hours. Lab Results  Component Value Date   WBC 15.6 (H) 07/26/2019   HGB 8.5 (L) 07/26/2019   HCT 25.8 (L) 07/26/2019   MCV 94.2 07/26/2019   PLT 202 07/26/2019    Recent Labs  Lab 07/26/19 0443  NA 138  K 4.8  CL 107  CO2 25  BUN 50*  CREATININE 0.66  CALCIUM 8.5*  PROT 4.9*  BILITOT 0.4  ALKPHOS 60  ALT 30  AST 26  GLUCOSE 133*   Lab Results  Component Value Date   CHOL 72 07/23/2019   HDL 17 (L) 07/23/2019   LDLCALC 38 07/23/2019   TRIG 86 07/23/2019   No results found for: DDIMER  Radiology/Studies:  CT ABDOMEN PELVIS WO CONTRAST  Result Date: 07/22/2019 CLINICAL DATA:  Sepsis EXAM: CT ABDOMEN AND PELVIS WITHOUT CONTRAST TECHNIQUE: Multidetector CT imaging of the abdomen and pelvis was performed following the standard protocol without IV contrast. COMPARISON:  None. FINDINGS: Lower chest: There is mild cardiomegaly. Mitral valve calcifications. A moderate paraesophageal hernia containing contrast is seen. Hepatobiliary: Although limited due to the lack of intravenous contrast, normal in appearance without gross focal abnormality. The patient is status post cholecystectomy. No biliary ductal dilation. Pancreas:  Unremarkable.  No surrounding inflammatory changes. Spleen: Normal in size. Although limited due to the lack of intravenous  contrast, normal in appearance. Adrenals/Urinary Tract: Both adrenal glands appear normal. The kidneys and collecting system appear normal without evidence of urinary tract calculus or hydronephrosis. Bladder is unremarkable. Stomach/Bowel: The stomach, small bowel, are normal in appearance. There is scattered colonic diverticula without diverticulitis. Vascular/Lymphatic: There are no enlarged abdominal or pelvic lymph nodes. Scattered aortic atherosclerotic calcifications are seen without aneurysmal dilatation. Reproductive: The uterus and adnexa are unremarkable. Other: A small fat and transverse colon containing umbilical hernia seen within the mid abdomen. Musculoskeletal: No acute or significant osseous findings. IMPRESSION: Moderate paraesophageal hernia. Diverticulosis without diverticulitis. No other acute intra-abdominal or pelvic pathology to explain the patient's symptoms. Aortic Atherosclerosis (ICD10-I70.0). Electronically Signed   By: Prudencio Pair M.D.   On: 07/22/2019 01:40   DG Chest 1 View  Result Date: 07/26/2019 CLINICAL DATA:  Shortness of breath. EXAM: CHEST  1 VIEW COMPARISON:  07/23/2019. FINDINGS: Mediastinum hilar structures normal. Cardiomegaly. Mild bilateral interstitial prominence and small pleural effusions. CHF could present in this fashion. Sliding hiatal hernia cannot be excluded. IMPRESSION: Cardiomegaly. Mild bilateral interstitial prominence and small pleural effusions. CHF could present in this fashion. Electronically Signed   By: Marcello Moores  Register   On: 07/26/2019 07:52   DG Chest 1 View  Result Date: 07/23/2019 CLINICAL DATA:  Dyspnea EXAM: CHEST  1 VIEW COMPARISON:  Chest radiograph from one day prior. FINDINGS: Stable cardiomediastinal silhouette with mild cardiomegaly. No pneumothorax. Stable small left pleural effusion. No right pleural effusion. No overt pulmonary edema. Streaky bibasilar lung opacities are similar IMPRESSION: Stable small left pleural effusion.  Stable streaky bibasilar lung opacities, favor atelectasis or scarring. Stable mild cardiomegaly without overt pulmonary edema. Electronically Signed   By: Ilona Sorrel M.D.   On: 07/23/2019 04:50   DG Chest 1 View  Result Date: 07/21/2019 CLINICAL DATA:  Sepsis. EXAM: CHEST  1 VIEW COMPARISON:  None. FINDINGS: There are small bilateral pleural effusions. There is a left perihilar density of unknown clinical significance.  The heart size is mildly enlarged. There are dense aortic calcifications. There are few streaky airspace opacities at the lung bases favored to represent areas of atelectasis. There is no pneumothorax. IMPRESSION: 1. Small bilateral pleural effusions. 2. Mild cardiomegaly. 3. Slightly prominent left perihilar region may be secondary to a dilated pulmonary artery or lymphadenopathy. A follow-up two-view chest x-ray is recommended in 4-6 weeks. Electronically Signed   By: Katherine Mantle M.D.   On: 07/21/2019 22:34   DG Chest Port 1 View  Result Date: 07/22/2019 CLINICAL DATA:  Shortness of breath. EXAM: PORTABLE CHEST 1 VIEW COMPARISON:  Jul 21, 2019 FINDINGS: Cardiomegaly. Skin fold over the lateral right chest. The hila and mediastinum are normal. No pneumothorax. No nodules or masses. No focal infiltrates. IMPRESSION: No active disease. Electronically Signed   By: Gerome Sam III M.D   On: 07/22/2019 15:12    Wt Readings from Last 3 Encounters:  07/25/19 74.5 kg    EKG: Atrial fibrillation rapid ventricular response  Physical Exam: Elderly female with some confusion.  No acute distress. Blood pressure 118/65, pulse (!) 126, temperature 98 F (36.7 C), resp. rate 18, height 5\' 4"  (1.626 m), weight 74.5 kg, SpO2 97 %. Body mass index is 28.18 kg/m. General: Well developed, well nourished, in no acute distress. Head: Normocephalic, atraumatic, sclera non-icteric, no xanthomas, nares are without discharge.  Neck: Negative for carotid bruits. JVD not elevated. Lungs:  Clear bilaterally to auscultation without wheezes, rales, or rhonchi. Breathing is unlabored. Heart: Irregular regular rhythm Abdomen: Soft, non-tender, non-distended with normoactive bowel sounds. No hepatomegaly. No rebound/guarding. No obvious abdominal masses. Msk:  Strength and tone appear normal for age. Extremities: No clubbing or cyanosis. No edema.  Distal pedal pulses are 2+ and equal bilaterally. Neuro: Responds somewhat appropriately however not a good historian.     Assessment and Plan  86 y.o. female with history of heart failure with preserved ejection fraction, chronic atrial fibrillation, coronary artery disease, dementia who is followed by 89 at [facility] who was admitted with weakness and acute onset of confusion worse over her baseline.  She had a cardiac MRI in 2011 showing normal RV and LV function.  There was a left atrial thrombus with no evidence of myocardial infarction.  She had a non-STEMI in 2006 with cardiac cath revealing distal LAD disease not amenable to PCI.  She had a PCI of the RCA with a Taxus stent.  This was done in 2006 earlier.  She has been on rate control with diltiazem for her A. fib.  She has been on Eliquis for anticoagulation.  She was diagnosed with both mixed Alzheimer's and vascular dementia in 2015.  Her most recent outpatient regimen included apixaban 5 mg twice daily, atorvastatin 80 mg daily, carvedilol 6.25 mg twice daily, diltiazem 240 mg daily, furosemide 80 mg twice daily, spironolactone 25 mg daily along with potassium chloride to 20 mEq daily.  She was last seen by her [facility] cardiology team approximately 1 year ago.  1. HFpEF-EF has been well-preserved by echo and cardiac MRI at [facility] with normal RV and LV function.  Chest x-ray did not show pulmonary edema on admission.  Fluid status appears to be over hydration.  She does not appear acutely in congestive heart failure at present.  Heart rate is controlled and she  is oxygenating well.  Would continue to carefully diurese following renal function and symptoms.  BNP was mildly elevated.  We will recheck this.  2.  Atrial fibrillation-rate appears well controlled at present heart rate was 75 on my exam.  We will continue with diltiazem and follow.  Continue with Eliquis at 5 mg twice daily as you are doing.  3.  Sepsis-continue with antibiotics and follow.  4.  Dementia-has mixed vascular and Alzheimer's dementia.  Per chart, appears to be getting near her baseline.  5.  Coronary artery disease-status post PCI.  This was done in 2006.  No clinical evidence of ischemia.  Patient denies chest pain at present.  Not a candidate for invasive evaluation at present.  Cardiac markers are unremarkable.  Signed, Dalia HeadingKenneth A Sherrin Stahle MD 07/26/2019, 12:59 PM Pager: [phone]

## 2019-07-26 NOTE — Evaluation (Signed)
Physical Therapy Evaluation Patient Details Name: Kimberly Wang MRN: [id number] DOB: 1930/07/12 Today's Date: 07/26/2019   History of Present Illness  Patient is an 86 year old female who presented to the emergency room with acute onset of altered mental status with generalized weakness and confusion and significant diminish p.o. intake, vomiting. Chest x-ray showed bilateral small pleural effusions with mild cardiomegaly. Found to have spesis/UTI. PMH includes Parkinson's dementia, CAD, HTN, MI.   Clinical Impression  Patient is confused with history of dementia,  but pleasant, cooperative and able to follow single step commands without difficulty. Patient required Min A- CGA for transfers with verbal cues for safety. Patient ambulated with Min A 31ft in the room and was fatigued with activity. Sp02 100% on room air with activity, post-gait and exercise. Patient has generalized deconditioning, requires physical assistance and verbal cues for safe mobility, and is a high fall risk . SNF is recommended at discharge if patient does not have 24 hour care at home. Recommend PT to maximize function and return to PLOF.     Follow Up Recommendations SNF;Supervision for mobility/OOB    Equipment Recommendations  None recommended by PT    Recommendations for Other Services       Precautions / Restrictions Precautions Precautions: Fall Restrictions Weight Bearing Restrictions: No      Mobility  Bed Mobility               General bed mobility comments: not assessed this session as patient sitting up in chair pre and post session   Transfers Overall transfer level: Needs assistance   Transfers: Sit to/from Stand Sit to Stand: Min assist         General transfer comment: Min A for first stand and CGA for second standing bout. verbal cues for hand placement and safety. patient does fatigue quickly with activity   Ambulation/Gait Ambulation/Gait assistance: Min assist Gait Distance  (Feet): 15 Feet Assistive device: 1 person hand held assist Gait Pattern/deviations: Trunk flexed Gait velocity: decreased    General Gait Details: gait distance self limited due to fatigue. patient is short of breath with activity with Sp02 100% on room air after walking. heart rate 111 bpm with activity.   Stairs            Wheelchair Mobility    Modified Rankin (Stroke Patients Only)       Balance Overall balance assessment: Needs assistance Sitting-balance support: Feet supported;No upper extremity supported Sitting balance-Leahy Scale: Good     Standing balance support: Bilateral upper extremity supported Standing balance-Leahy Scale: Fair Standing balance comment: patient required UE support to safely maintain midline standing balance. standing tolerance also limited to ~ 2 minutes secondary to fatigue and shortness of breath                              Pertinent Vitals/Pain Pain Assessment: No/denies pain    Home Living Family/patient expects to be discharged to:: Private residence Living Arrangements: Alone Available Help at Discharge: Family;Available PRN/intermittently Type of Home: House                Prior Function Level of Independence: Needs assistance   Gait / Transfers Assistance Needed:  RW for household mobility  ADL's / Homemaking Assistance Needed: family assists with some ADLs         Hand Dominance        Extremity/Trunk Assessment   Upper Extremity Assessment Upper  Extremity Assessment: Generalized weakness(generalized weakness for sustained activity )    Lower Extremity Assessment Lower Extremity Assessment: RLE deficits/detail;LLE deficits/detail;Generalized weakness RLE Deficits / Details: grossly 4+/5 throughout with endurance impaired for sustained activity  RLE Sensation: WNL(to light touch ) LLE Deficits / Details: grossly 4+/5 throughout with endurance impaired for sustained activity  LLE Sensation:  WNL(to light touch )       Communication   Communication: No difficulties  Cognition Arousal/Alertness: Awake/alert Behavior During Therapy: WFL for tasks assessed/performed Overall Cognitive Status: No family/caregiver present to determine baseline cognitive functioning                                 General Comments: patient does have baseline dementia per notes. patient alert to self only. patient is able to follow single step commands without difficulty       General Comments    Treatment included therapeutic exercises performed in chair. Cues for technique. Patient fatigued with activity, Sp02 100% on room air and heart rate 111-116bpm with activity.   Exercises General Exercises - Lower Extremity Ankle Circles/Pumps: AROM;Strengthening;Both;10 reps;Seated Short Arc Quad: AAROM;Strengthening;Both;10 reps;Seated Long Arc Quad: AROM;Strengthening;Both;Seated Hip ABduction/ADduction: AROM;Both;10 reps;Seated   Assessment/Plan    PT Assessment Patient needs continued PT services  PT Problem List Decreased strength;Decreased range of motion;Decreased activity tolerance;Decreased balance;Decreased mobility;Decreased cognition;Decreased safety awareness;Decreased knowledge of use of DME       PT Treatment Interventions DME instruction;Gait training;Stair training;Functional mobility training;Therapeutic activities;Therapeutic exercise;Balance training;Neuromuscular re-education;Patient/family education    PT Goals (Current goals can be found in the Care Plan section)  Acute Rehab PT Goals Patient Stated Goal: to return to prior level of function  PT Goal Formulation: With patient Time For Goal Achievement: 08/09/19 Potential to Achieve Goals: Fair    Frequency Min 2X/week   Barriers to discharge        Co-evaluation               AM-PAC PT "6 Clicks" Mobility  Outcome Measure Help needed turning from your back to your side while in a flat bed  without using bedrails?: A Little Help needed moving from lying on your back to sitting on the side of a flat bed without using bedrails?: A Little Help needed moving to and from a bed to a chair (including a wheelchair)?: A Little Help needed standing up from a chair using your arms (e.g., wheelchair or bedside chair)?: A Little Help needed to walk in hospital room?: A Little Help needed climbing 3-5 steps with a railing? : A Lot 6 Click Score: 17    End of Session   Activity Tolerance: Patient limited by fatigue Patient left: in chair;with chair alarm set;with call bell/phone within reach(educated patient how to use call bell for assistance ) Nurse Communication: Mobility status(via white board ) PT Visit Diagnosis: Other abnormalities of gait and mobility (R26.89);Muscle weakness (generalized) (M62.81);Difficulty in walking, not elsewhere classified (R26.2)    Time: 8938-1017 PT Time Calculation (min) (ACUTE ONLY): 25 min   Charges:   PT Evaluation $PT Eval Low Complexity: 1 Low PT Treatments $Therapeutic Exercise: 8-22 mins       Minna Merritts, PT, MPT   Percell Locus 07/26/2019, 10:34 AM

## 2019-07-26 NOTE — Progress Notes (Addendum)
PROGRESS NOTE    Kimberly Wang  OZH:[id number] DOB: [birth year] DOA: 07/22/2019 PCP: [name], MD   Brief Narrative:  HPI per Dr. Valente David on 07/22/2019 Kimberly Wang  is a 86 y.o. female with a known history of coronary artery disease, hypertension and dementia, who presented to the emergency room with acute onset of altered mental status with generalized weakness and confusion and significant diminish p.o. intake especially today.  She vomited once.  She was having low-grade fever without chills.  She denied any abdominal pain.  No significant dysuria, hematuria or urgency or frequency or flank pain.  No cough or wheezing or dyspnea.  Upon presentation to the emergency room, temperature was 100.2 and heart rate 101 with otherwise normal vital signs.  CMP revealed hyponatremia and hypokalemia as well as hypochloremia with blood glucose of 223, BUN of 18 and creatinine 1.14, albumin of 3.4.  Lactic acid was 3.8 and later 3.7.  High-sensitivity troponin I was 60.  CBC showed leukocytosis of 25.1 with neutrophilia.  Urinalysis showed 6-10 WBCs with positive nitrite and trace leukocytes.  Blood cultures were drawn and urine culture was sent.  Abdominal and pelvic CT scan revealed paraesophageal hernia with no acute findings.  Chest x-ray showed bilateral small pleural effusions with mild cardiomegaly.  The patient was given 1 g of p.o. Tylenol, a gram of IV cefepime and a gram of IV vancomycin, 1 L of lactated Ringer, 10 medical IV potassium chloride and 40 mEq p.o. as well as 1 L IV normal saline.  She will be admitted to the progressive unit bed for further evaluation and management.  **Interim History Remains pleasantly demented but will need PT OT to further evaluate and treat and now they are recommending SNF. Antibiotics have been changed to p.o.  She was wheezing a little bit her IV fluids have been stopped and she is given a dose of IV Lasix yesterday and again today. Cardiology was  consulted for concern of CHF and Volume overload. She remains in Atrial Fibrillation but has had some faster heart rates.  Assessment & Plan:   Active Problems:   Coagulation defect (HCC)   Sepsis due to gram-negative UTI (HCC)   Severe sepsis (HCC)   Dementia due to Parkinson's disease without behavioral disturbance (HCC)   Benign essential HTN   CAD (coronary artery disease)  Severe sepsis/ positive E. coli UTI -HR> 90, WBC> 12, lactic acid> 2 -Complete 5-day course of antibiotics -Stopped  Normal saline 21ml/hr given her Wheezing and Dyspnea -Give 1x of IV Lasix yesterday and she has breathing treatments and given another dose today  -Repeat chest x-ray this AM showed "Cardiomegaly and mild bilateral interstitial prominence and small pleural effusions" -6/2 change to oral antibiotics to po Keflex -PT/OT to further evaluate and Treat; OT done recommending home health OT still need PT evaluation if family cannot look after her 24/7 then they recommend SNF for increased safety and functional strengthening before returning home -WBC was improving and went from 14.8 -> 11.9 but is now 15.6 -Patient procalcitonin level has been intermittently elevated and ranging from 0.68 and is trended down to 0.49 is now 0.54 -lactic acid level was 2.1 and went up to 4.1 is now down to 2.8  -repeat lactic acid level this AM was 1.1 -We will discuss with the [facility] team and if she will go home she can likely go home in the next 24 to 48 hours versus SNF; PT/OT recommending SNF  AcuteMetabolic Encephalopathy  superimposed on chronic dementia -Multifactorial to include severe sepsis, hyponatremia.  Son was able to give accurate baseline a Wang days ago to my colleague  -C/w Neurochecks q 4 hrs -Aricept 10 mg daily -C/w Memantine 10 mg daily and continue with quetiapine 25 mg p.o. nightly as needed for agitation and sleep -Continue with delirium precautions -Minimize sedating medication  A. fib with  RVR -Rates have been intermittently high and cardiology recommending continuing diltiazem and following -they recommending continue anticoagulation with Eliquis and continue telemetry monitoring  Acute on chronic diastolic CHF -Mild in the setting of volume overload -BNP was elevated 125 next-chest x-ray did show a CHF pattern picture -Given IV Lasix yesterday and this morning-cardiology does not feel that she is in acute heart failure -She is +6.573 L since admission and weight is up 4 pounds  -She was given IV Lasix 20 mg today and cardiology recommends continuing to carefully diurese follow renal function and symptoms  Hyponatremia -Multifactorial volume depletion, SSRI, anorexia. -Correct underlying factors -IVF now stopped and will be given a dose of IV Lasix 20 mg x1 yesterday and again today  -Zoloft 50 mg daily (hold) -Megace 400 mg daily  Elevated troponin high-sensitivity/demand ischemia She has a history of CAD status post PCI done in 2006 -Troponin was 60 and repeat was 64 -Not consistent with ACS.  Most consistent with demand ischemia. -Not complaining of any Chest Pain -Continue with a apixaban 5 mg p.o. twice daily, atorvastatin 80 mg p.o. daily, and nitroglycerin 0.4 mg sublingual every 5 minutes as needed chest pain -Currently does not have any chest pain cardiac markers are unremarkable  Essential HTN -C/w Cardizem 240 mg daily  Dyslipidemia -C/w Lipitor 80 mg daily -Lipid panel done showed a total cholesterol/HDL ratio of 4.2, cholesterol of 172, HDL level of 17, LDL 38, triglycerides of 86 and VLDL of 17  Coagulation defect -Continue with apixaban 5 mg BID  Elevated BUN -Patient is BUN is now 15 -check FOBT to rule out Upper GI bleeding as her hemoglobin has been slowly dropping -Currently not on steroids-continue to monitor carefully and repeat CMP in a.m.  Normocytic anemia -Patient hemoglobin/hematocrit has been slowly dropping and went from 10.6/31.8  down to 9.2/28.3 and has further declined to 8.5/25.8 -Check anemia panel in a.m. -Check FOBT to rule out GI bleeding -Anticoagulated with apixaban-continue monitor for signs and symptoms bleeding; currently no overt bleeding noted -Repeat CBC in a.m.  Hypokalemia -Goal of potassium level> 4 -Potassium today is 4.8  -continue to monitor and replete as necessary -Repeat CMP in a.m.  Hypomagnesemia -Mag Level was 2.0 today -Continue to monitor replete as necessary -Repeat CBC in a.m.  Hypophosphatemia -Goal> 2.5 -Patient's phosphorus is now 2.7 -Continue to monitor and treat as necessary  DVT prophylaxis: SCDs; Anticoagulated with Apxiaban  Code Status: FULL CODE Family Communication: No family present at bedside  Disposition Plan: Further evaluation by PT OT for safe discharge disposition and they are recommending SNF.  Her antibiotics have been now changed to p.o. cardiology is consulted given her concern for volume overload and CHF  Status is: Inpatient  Remains inpatient appropriate because:Unsafe d/c plan and Inpatient level of care appropriate due to severity of illness   Dispo: The patient is from: Home              Anticipated d/c is to: TBD; SNF vs. Home Health PT/OT              Anticipated d/c date  is: 1 day              Patient currently is not medically stable to d/c.  Consultants:   Cardiology   Procedures: None  Antimicrobials:  Anti-infectives (From admission, onward)   Start     Dose/Rate Route Frequency Ordered Stop   07/26/19 0600  cephALEXin (KEFLEX) capsule 500 mg     500 mg Oral Every 6 hours 07/25/19 1718 07/29/19 0559   07/22/19 0900  cefTRIAXone (ROCEPHIN) 1 g in sodium chloride 0.9 % 100 mL IVPB  Status:  Discontinued     1 g 200 mL/hr over 30 Minutes Intravenous Every 24 hours 07/22/19 0248 07/25/19 1714   07/22/19 0145  vancomycin (VANCOCIN) IVPB 1000 mg/200 mL premix     1,000 mg 200 mL/hr over 60 Minutes Intravenous  Once 07/22/19 0144  07/22/19 0349   07/22/19 0045  ceFEPIme (MAXIPIME) 1 g in sodium chloride 0.9 % 100 mL IVPB     1 g 200 mL/hr over 30 Minutes Intravenous  Once 07/22/19 0039 07/22/19 0202     Subjective: Seen and examined at bedside she was sitting in the chair and had worked with physical therapy.  They recommended skilled nursing facility and they stated that she was a little dyspneic but her O2 saturation did not drop.  No nausea or vomiting.  Felt okay.  Heart rates are irregularly irregular and slightly fast.  No other concerns or complaints and patient remains pleasantly demented.  Objective: Vitals:   07/26/19 1201 07/26/19 1300 07/26/19 1643 07/26/19 1813  BP: 118/65  117/65   Pulse: (!) 126 74 (!) 124   Resp: 18  19   Temp:    97.7 F (36.5 C)  TempSrc:    Oral  SpO2: 97%  100%   Weight:      Height:        Intake/Output Summary (Last 24 hours) at 07/26/2019 1827 Last data filed at 07/26/2019 1410 Gross per 24 hour  Intake --  Output 0 ml  Net 0 ml   Filed Weights   07/21/19 2136 07/23/19 0500 07/25/19 0406  Weight: 72.6 kg 72.7 kg 74.5 kg   Examination: Physical Exam:  Constitutional: WN/WD overweight elderly pleasantly demented Caucasian female currently in no acute distress and sitting in the chair bedside. Eyes: Lids and conjunctivae normal, sclerae anicteric  ENMT: External Ears, Nose appear normal.  Neck: Appears normal, supple, no cervical masses, normal ROM, no appreciable thyromegaly; no appreciable JVD Respiratory: Diminished to auscultation bilaterally with coarse breath sounds and some crackles and mild wheezing. Normal respiratory effort and patient is not tachypenic. No accessory muscle use.  Unlabored breathing Cardiovascular: Irregularly irregular and a little tachycardic, 2 out of 6 soft murmur.  S1 and S2 auscultated.  Mild extremity edema.  Abdomen: Soft, non-tender, distended secondary body habitus. Bowel sounds positive.  GU: Deferred. Musculoskeletal: No  clubbing / cyanosis of digits/nails. No joint deformity upper and lower extremities.  Skin: No rashes, lesions, ulcers on limited skin evaluation. No induration; Warm and dry.  Neurologic: CN 2-12 grossly intact with no focal deficits. Romberg sign and cerebellar reflexes not assessed.  Psychiatric: Impaired judgment and insight.  She is alert but not oriented x 3. Normal mood and appropriate affect.   Data Reviewed: I have personally reviewed following labs and imaging studies  CBC: Recent Labs  Lab 07/22/19 1214 07/23/19 0433 07/24/19 0528 07/25/19 0527 07/26/19 0443  WBC 15.7* 14.3* 14.8* 11.9* 15.6*  NEUTROABS 14.2* 12.7*  13.7* 9.8* 12.3*  HGB 12.3 11.8* 10.6* 9.2* 8.5*  HCT 35.9* 36.0 31.8* 28.3* 25.8*  MCV 89.3 92.5 91.9 93.1 94.2  PLT 134* 143* 154 180 202   Basic Metabolic Panel: Recent Labs  Lab 07/22/19 1214 07/23/19 0433 07/24/19 0528 07/25/19 0527 07/26/19 0443  NA 134* 135 136 142 138  K 3.5 4.7 4.7 4.5 4.8  CL 101 103 105 113* 107  CO2 25 24 22 22 25   GLUCOSE 121* 119* 140* 121* 133*  BUN 17 16 23  40* 50*  CREATININE 0.79 0.63 0.64 0.75 0.66  CALCIUM 8.3* 8.5* 8.6* 8.5* 8.5*  MG 1.6* 2.3 2.3 2.4 2.0  PHOS  --  1.7* 2.3* 3.3 2.7   GFR: Estimated Creatinine Clearance: 49.9 mL/min (by C-G formula based on SCr of 0.66 mg/dL). Liver Function Tests: Recent Labs  Lab 07/21/19 2251 07/23/19 0433 07/24/19 0528 07/25/19 0527 07/26/19 0443  AST 38 40 35 31 26  ALT 28 27 29  33 30  ALKPHOS 126 96 85 69 60  BILITOT 1.2 1.0 0.8 0.4 0.4  PROT 7.3 5.9* 5.6* 5.0* 4.9*  ALBUMIN 3.4* 2.8* 2.5* 2.3* 2.3*   No results for input(s): LIPASE, AMYLASE in the last 168 hours. No results for input(s): AMMONIA in the last 168 hours. Coagulation Profile: Recent Labs  Lab 07/21/19 2208 07/22/19 0646  INR 1.6* 1.5*   Cardiac Enzymes: No results for input(s): CKTOTAL, CKMB, CKMBINDEX, TROPONINI in the last 168 hours. BNP (last 3 results) No results for input(s):  PROBNP in the last 8760 hours. HbA1C: No results for input(s): HGBA1C in the last 72 hours. CBG: No results for input(s): GLUCAP in the last 168 hours. Lipid Profile: No results for input(s): CHOL, HDL, LDLCALC, TRIG, CHOLHDL, LDLDIRECT in the last 72 hours. Thyroid Function Tests: No results for input(s): TSH, T4TOTAL, FREET4, T3FREE, THYROIDAB in the last 72 hours. Anemia Panel: No results for input(s): VITAMINB12, FOLATE, FERRITIN, TIBC, IRON, RETICCTPCT in the last 72 hours. Sepsis Labs: Recent Labs  Lab 07/22/19 1214 07/22/19 1214 07/22/19 1855 07/23/19 0433 07/23/19 1342 07/23/19 1638 07/24/19 0528 07/25/19 0527 07/26/19 0443  PROCALCITON 0.95  --   --  0.68  --   --  0.49 0.54  --   LATICACIDVEN 2.2*   < > 2.1*  --  4.1* 2.8*  --   --  1.0   < > = values in this interval not displayed.    Recent Results (from the past 240 hour(s))  Culture, blood (Routine x 2)     Status: None   Collection Time: 07/21/19 10:07 PM   Specimen: BLOOD  Result Value Ref Range Status   Specimen Description BLOOD LEFT ANTECUBITAL  Final   Special Requests   Final    BOTTLES DRAWN AEROBIC AND ANAEROBIC Blood Culture results may not be optimal due to an excessive volume of blood received in culture bottles   Culture   Final    NO GROWTH 5 DAYS Performed at [facility], [address] [place], [place] [address]    Report Status 07/26/2019 FINAL  Final  Culture, blood (Routine x 2)     Status: None   Collection Time: 07/21/19 10:51 PM   Specimen: BLOOD  Result Value Ref Range Status   Specimen Description BLOOD BLOOD RIGHT ARM  Final   Special Requests   Final    BOTTLES DRAWN AEROBIC AND ANAEROBIC Blood Culture adequate volume   Culture   Final    NO GROWTH  5 DAYS Performed at [facility], [address] [place], [place] [address]    Report Status 07/26/2019 FINAL  Final  SARS Coronavirus 2 by RT PCR (hospital order, performed in [facility] hospital lab)  Nasopharyngeal Nasopharyngeal Swab     Status: None   Collection Time: 07/22/19 12:34 AM   Specimen: Nasopharyngeal Swab  Result Value Ref Range Status   SARS Coronavirus 2 NEGATIVE NEGATIVE Final    Comment: (NOTE) SARS-CoV-2 target nucleic acids are NOT DETECTED. The SARS-CoV-2 RNA is generally detectable in upper and lower respiratory specimens during the acute phase of infection. The lowest concentration of SARS-CoV-2 viral copies this assay can detect is 250 copies / mL. A negative result does not preclude SARS-CoV-2 infection and should not be used as the sole basis for treatment or other patient management decisions.  A negative result may occur with improper specimen collection / handling, submission of specimen other than nasopharyngeal swab, presence of viral mutation(s) within the areas targeted by this assay, and inadequate number of viral copies (<250 copies / mL). A negative result must be combined with clinical observations, patient history, and epidemiological information. Fact Sheet for Patients:   StrictlyIdeas.no Fact Sheet for Healthcare Providers: BankingDealers.co.za This test is not yet approved or cleared  by the [place] FDA and has been authorized for detection and/or diagnosis of SARS-CoV-2 by FDA under an Emergency Use Authorization (EUA).  This EUA will remain in effect (meaning this test can be used) for the duration of the COVID-19 declaration under Section 564(b)(1) of the Act, 21 U.S.C. section 360bbb-3(b)(1), unless the authorization is terminated or revoked sooner. Performed at [facility], [address] [place], [place] [address]   Urine Culture     Status: Abnormal   Collection Time: 07/22/19 12:54 AM   Specimen: Urine, Random  Result Value Ref Range Status   Specimen Description   Final    URINE, RANDOM Performed at [facility], [address] [place], [place]  [address]    Special Requests   Final    NONE Performed at [facility], [address] [place], [place] [address]    Culture >=100,000 COLONIES/mL ESCHERICHIA COLI (A)  Final   Report Status 07/25/2019 FINAL  Final   Organism ID, Bacteria ESCHERICHIA COLI (A)  Final      Susceptibility   Escherichia coli - MIC*    AMPICILLIN >=32 RESISTANT Resistant     CEFAZOLIN <=4 SENSITIVE Sensitive     CEFTRIAXONE <=1 SENSITIVE Sensitive     CIPROFLOXACIN 0.5 SENSITIVE Sensitive     GENTAMICIN <=1 SENSITIVE Sensitive     IMIPENEM <=0.25 SENSITIVE Sensitive     NITROFURANTOIN <=16 SENSITIVE Sensitive     TRIMETH/SULFA >=320 RESISTANT Resistant     AMPICILLIN/SULBACTAM 4 SENSITIVE Sensitive     PIP/TAZO <=4 SENSITIVE Sensitive     * >=100,000 COLONIES/mL ESCHERICHIA COLI     RN Pressure Injury Documentation:     Estimated body mass index is 28.18 kg/m as calculated from the following:   Height as of this encounter: 5\' 4"  (1.626 m).   Weight as of this encounter: 74.5 kg.  Malnutrition Type:      Malnutrition Characteristics:      Nutrition Interventions:     Radiology Studies: DG Chest 1 View  Result Date: 07/26/2019 CLINICAL DATA:  Shortness of breath. EXAM: CHEST  1 VIEW COMPARISON:  07/23/2019. FINDINGS: Mediastinum hilar structures normal. Cardiomegaly. Mild bilateral interstitial prominence and small  pleural effusions. CHF could present in this fashion. Sliding hiatal hernia cannot be excluded. IMPRESSION: Cardiomegaly. Mild bilateral interstitial prominence and small pleural effusions. CHF could present in this fashion. Electronically Signed   By: Maisie Fus  Register   On: 07/26/2019 07:52    Scheduled Meds: . apixaban  5 mg Oral BID  . atorvastatin  80 mg Oral Daily  . cephALEXin  500 mg Oral Q6H  . diltiazem  240 mg Oral Daily  . donepezil  10 mg Oral QHS  . ferrous sulfate  325 mg Oral Q breakfast  . megestrol  400 mg Oral Daily  . memantine  10 mg Oral  Daily  . multivitamin with minerals  1 tablet Oral Daily  . potassium chloride  40 mEq Oral Once  . potassium chloride SA  20 mEq Oral Daily   Continuous Infusions:   LOS: 4 days   Merlene Laughter, DO [facility] PAGER is on AMION  If 7PM-7AM, please contact night-coverage [email or web address]

## 2019-07-26 NOTE — [place] FL2 (Addendum)
MEDICAID FL2 LEVEL OF CARE SCREENING TOOL     IDENTIFICATION  Patient Name: Kimberly Wang Birthdate: 11/27/1930 Sex: female Admission Date (Current Location): 07/22/2019  [place] and [place] Number:  Chiropodist and Address:  [facility], [address] [place], [place] [address]      Provider Number: [id number]  Attending Physician Name and Address:  Merlene Laughter, DO  Relative Name and Phone Number:       Current Level of Care: Hospital Recommended Level of Care: Skilled Nursing Facility Prior Approval Number:    Date Approved/Denied:   PASRR Number: [id number] E Expires 08/31/19  Discharge Plan: SNF    Current Diagnoses: Patient Active Problem List   Diagnosis Date Noted  . Sepsis due to gram-negative UTI (HCC) 07/22/2019  . Severe sepsis (HCC) 07/22/2019  . Dementia due to Parkinson's disease without behavioral disturbance (HCC) 07/22/2019  . Benign essential HTN 07/22/2019  . CAD (coronary artery disease) 07/22/2019  . Pain due to onychomycosis of toenails of both feet 05/17/2019  . Coagulation defect (HCC) 05/17/2019    Orientation RESPIRATION BLADDER Height & Weight     Self  Normal Continent Weight: 164 lb 3.2 oz (74.5 kg) Height:  5\' 4"  (162.6 cm)  BEHAVIORAL SYMPTOMS/MOOD NEUROLOGICAL BOWEL NUTRITION STATUS      Continent Diet(heart healthy, thin liquids)  AMBULATORY STATUS COMMUNICATION OF NEEDS Skin   Limited Assist Verbally Normal                       Personal Care Assistance Level of Assistance  Bathing, Dressing, Feeding Bathing Assistance: Limited assistance Feeding assistance: Independent Dressing Assistance: Limited assistance     Functional Limitations Info  Sight, Hearing, Speech Sight Info: Adequate Hearing Info: Adequate Speech Info: Adequate    SPECIAL CARE FACTORS FREQUENCY  PT (By licensed PT), OT (By licensed OT)     PT Frequency: 5x OT Frequency: 5x             Contractures Contractures Info: Not present    Additional Factors Info  Code Status, Allergies Code Status Info: Full Code Allergies Info: Latex, Lidocaine Hcl, Codeine, Morphine, Novocain (Procaine), Other           Current Medications (07/26/2019):  This is the current hospital active medication list Current Facility-Administered Medications  Medication Dose Route Frequency Provider Last Rate Last Admin  . acetaminophen (TYLENOL) tablet 650 mg  650 mg Oral Q6H PRN Mansy, Jan A, MD   650 mg at 07/25/19 2125   Or  . acetaminophen (TYLENOL) suppository 650 mg  650 mg Rectal Q6H PRN Mansy, Jan A, MD      . apixaban (ELIQUIS) tablet 5 mg  5 mg Oral BID Mansy, Jan A, MD   5 mg at 07/26/19 0913  . atorvastatin (LIPITOR) tablet 80 mg  80 mg Oral Daily Mansy, Jan A, MD   80 mg at 07/26/19 0913  . cephALEXin (KEFLEX) capsule 500 mg  500 mg Oral Q6H Sheikh, Omair Kellogg, DO   500 mg at 07/26/19 0700  . diltiazem (CARDIZEM CD) 24 hr capsule 240 mg  240 mg Oral Daily Mansy, Jan A, MD   240 mg at 07/26/19 0913  . donepezil (ARICEPT) tablet 10 mg  10 mg Oral QHS Mansy, Jan A, MD   10 mg at 07/25/19 2125  . ferrous sulfate tablet 325 mg  325 mg Oral Q breakfast Mansy, Jan A, MD   325 mg at  07/26/19 0813  . ipratropium-albuterol (DUONEB) 0.5-2.5 (3) MG/3ML nebulizer solution 3 mL  3 mL Nebulization Q6H PRN Lang Snow, NP   3 mL at 07/23/19 1101  . magnesium hydroxide (MILK OF MAGNESIA) suspension 30 mL  30 mL Oral Daily PRN Mansy, Jan A, MD      . megestrol (MEGACE) 400 MG/10ML suspension 400 mg  400 mg Oral Daily Allie Bossier, MD   400 mg at 07/26/19 0916  . memantine (NAMENDA) tablet 10 mg  10 mg Oral Daily Mansy, Jan A, MD   10 mg at 07/26/19 0916  . multivitamin with minerals tablet 1 tablet  1 tablet Oral Daily Hart Robinsons A, RPH   1 tablet at 07/26/19 0913  . nitroGLYCERIN (NITROSTAT) SL tablet 0.4 mg  0.4 mg Sublingual Q5 min PRN Mansy, Jan A, MD      . ondansetron [facility])  tablet 4 mg  4 mg Oral Q6H PRN Mansy, Jan A, MD       Or  . ondansetron [facility]) injection 4 mg  4 mg Intravenous Q6H PRN Mansy, Jan A, MD      . potassium chloride (KLOR-CON) packet 40 mEq  40 mEq Oral Once Mansy, Jan A, MD      . potassium chloride SA (KLOR-CON) CR tablet 20 mEq  20 mEq Oral Daily Mansy, Jan A, MD   20 mEq at 07/26/19 0912  . QUEtiapine (SEROQUEL) tablet 25 mg  25 mg Oral QHS PRN Lang Snow, NP   25 mg at 07/25/19 2125  . traZODone (DESYREL) tablet 25 mg  25 mg Oral QHS PRN Mansy, Jan A, MD   25 mg at 07/25/19 2125     Discharge Medications: Please see discharge summary for a list of discharge medications.  Relevant Imaging Results:  Relevant Lab Results:   Additional Information SSN:[id number]  Gerrianne Scale Destiny Trickey, LCSW

## 2019-07-26 NOTE — TOC Initial Note (Signed)
Transition of Care [facility]) - Initial/Assessment Note    Patient Details  Name: Kimberly Wang MRN: [id number] Date of Birth: 1930-05-06  [facility]) CM/SW Contact:    Eileen Stanford, LCSW Phone Number: 07/26/2019, 2:03 PM  Clinical Narrative:   Pt is only alert to self. CSW spoke with pt's son Truman Hayward. Pt's son states pt lives at [facility] ([place] Pt's son is agreeable for pt to go to SNF prior to returning to her ILF. Pt's son would prefer [facility]. CSW will send referral.                Expected Discharge Plan: Skilled Nursing Facility Barriers to Discharge: Continued Medical Work up   Patient Goals and CMS Choice Patient states their goals for this hospitalization and ongoing recovery are:: to get better-per son   Choice offered to / list presented to : Adult Children  Expected Discharge Plan and Services Expected Discharge Plan: [place] In-house Referral: Clinical Social Work Discharge Planning Services: CM Consult Post Acute Care Choice: [place] Living arrangements for the past 2 months: [place]                                      Prior Living Arrangements/Services Living arrangements for the past 2 months: [place] Lives with:: Self Patient language and need for interpreter reviewed:: Yes Do you feel safe going back to the place where you live?: Yes      Need for Family Participation in Patient Care: Yes (Comment) Care giver support system in place?: Yes (comment)   Criminal Activity/Legal Involvement Pertinent to Current Situation/Hospitalization: No - Comment as needed  Activities of Daily Living Home Assistive Devices/Equipment: None ADL Screening (condition at time of admission) Patient's cognitive ability adequate to safely complete daily activities?: Yes Is the patient deaf or have difficulty hearing?: Yes Does the patient have difficulty  seeing, even when wearing glasses/contacts?: No Does the patient have difficulty concentrating, remembering, or making decisions?: No Patient able to express need for assistance with ADLs?: Yes Does the patient have difficulty dressing or bathing?: No Independently performs ADLs?: Yes (appropriate for developmental age) Does the patient have difficulty walking or climbing stairs?: No Weakness of Legs: None Weakness of Arms/Hands: None  Permission Sought/Granted Permission sought to share information with : Family Supports Permission granted to share information with : Yes, Release of Information Signed  Share Information with NAME: Truman Hayward  Permission granted to share info w AGENCY: Dietitian granted to share info w Relationship: Son     Emotional Assessment Appearance:: Appears stated age Attitude/Demeanor/Rapport: Unable to Assess Affect (typically observed): Unable to Assess Orientation: : Oriented to Self Alcohol / Substance Use: Not Applicable Psych Involvement: No (comment)  Admission diagnosis:  Hypokalemia [E87.6] Hyponatremia [E87.1] Demand ischemia (HCC) [I24.8] AKI (acute kidney injury) ([place]) [N17.9] Sepsis due to urinary tract infection ([place]) [A41.9, N39.0] Sepsis ([place]) [A41.9] Sepsis due to gram-negative UTI ([place]) [A41.50, N39.0] Patient Active Problem List   Diagnosis Date Noted  . Sepsis due to gram-negative UTI ([place]) 07/22/2019  . Severe sepsis ([place]) 07/22/2019  . Dementia due to Parkinson's disease without behavioral disturbance ([place]) 07/22/2019  . Benign essential HTN 07/22/2019  . CAD (coronary artery disease) 07/22/2019  . Pain due to onychomycosis of toenails of both feet 05/17/2019  . Coagulation defect ([place]) 05/17/2019  PCP:  [name], MD Pharmacy:  No Pharmacies Listed    Social Determinants of Health (SDOH) Interventions    Readmission Risk Interventions No flowsheet data found.

## 2019-07-27 ENCOUNTER — Inpatient Hospital Stay: Payer: Medicare PPO

## 2019-07-27 DIAGNOSIS — D649 Anemia, unspecified: Secondary | ICD-10-CM

## 2019-07-27 DIAGNOSIS — N390 Urinary tract infection, site not specified: Secondary | ICD-10-CM

## 2019-07-27 DIAGNOSIS — A419 Sepsis, unspecified organism: Secondary | ICD-10-CM

## 2019-07-27 LAB — CBC WITH DIFFERENTIAL/PLATELET
Abs Immature Granulocytes: 0.43 10*3/uL — ABNORMAL HIGH (ref 0.00–0.07)
Basophils Absolute: 0.1 10*3/uL (ref 0.0–0.1)
Basophils Relative: 0 %
Eosinophils Absolute: 0.3 10*3/uL (ref 0.0–0.5)
Eosinophils Relative: 2 %
HCT: 24.2 % — ABNORMAL LOW (ref 36.0–46.0)
Hemoglobin: 7.8 g/dL — ABNORMAL LOW (ref 12.0–15.0)
Immature Granulocytes: 3 %
Lymphocytes Relative: 12 %
Lymphs Abs: 2 10*3/uL (ref 0.7–4.0)
MCH: 31.1 pg (ref 26.0–34.0)
MCHC: 32.2 g/dL (ref 30.0–36.0)
MCV: 96.4 fL (ref 80.0–100.0)
Monocytes Absolute: 0.7 10*3/uL (ref 0.1–1.0)
Monocytes Relative: 4 %
Neutro Abs: 13 10*3/uL — ABNORMAL HIGH (ref 1.7–7.7)
Neutrophils Relative %: 79 %
Platelets: 232 10*3/uL (ref 150–400)
RBC: 2.51 MIL/uL — ABNORMAL LOW (ref 3.87–5.11)
RDW: 14.2 % (ref 11.5–15.5)
WBC: 16.6 10*3/uL — ABNORMAL HIGH (ref 4.0–10.5)
nRBC: 0.4 % — ABNORMAL HIGH (ref 0.0–0.2)

## 2019-07-27 LAB — COMPREHENSIVE METABOLIC PANEL
ALT: 30 U/L (ref 0–44)
AST: 27 U/L (ref 15–41)
Albumin: 2.3 g/dL — ABNORMAL LOW (ref 3.5–5.0)
Alkaline Phosphatase: 63 U/L (ref 38–126)
Anion gap: 8 (ref 5–15)
BUN: 34 mg/dL — ABNORMAL HIGH (ref 8–23)
CO2: 24 mmol/L (ref 22–32)
Calcium: 8.6 mg/dL — ABNORMAL LOW (ref 8.9–10.3)
Chloride: 105 mmol/L (ref 98–111)
Creatinine, Ser: 0.8 mg/dL (ref 0.44–1.00)
GFR calc Af Amer: >60 mL/min (ref >60)
GFR calc non Af Amer: >60 mL/min (ref >60)
Glucose, Bld: 134 mg/dL — ABNORMAL HIGH (ref 70–99)
Potassium: 4.3 mmol/L (ref 3.5–5.1)
Sodium: 137 mmol/L (ref 135–145)
Total Bilirubin: 0.7 mg/dL (ref 0.3–1.2)
Total Protein: 5 g/dL — ABNORMAL LOW (ref 6.5–8.1)

## 2019-07-27 LAB — VITAMIN B12: Vitamin B-12: 440 pg/mL (ref 180–914)

## 2019-07-27 LAB — MAGNESIUM: Magnesium: 2.1 mg/dL (ref 1.7–2.4)

## 2019-07-27 LAB — FOLATE: Folate: 14.8 ng/mL (ref >5.9)

## 2019-07-27 LAB — RETICULOCYTES
Immature Retic Fract: 48.3 % — ABNORMAL HIGH (ref 2.3–15.9)
RBC.: 2.54 MIL/uL — ABNORMAL LOW (ref 3.87–5.11)
Retic Count, Absolute: 67.6 10*3/uL (ref 19.0–186.0)
Retic Ct Pct: 2.7 % (ref 0.4–3.1)

## 2019-07-27 LAB — IRON AND TIBC
Iron: 30 ug/dL (ref 28–170)
Saturation Ratios: 11 % (ref 10.4–31.8)
TIBC: 263 ug/dL (ref 250–450)
UIBC: 233 ug/dL

## 2019-07-27 LAB — RETIC PANEL
Immature Retic Fract: 49.6 % — ABNORMAL HIGH (ref 2.3–15.9)
RBC.: 2.5 MIL/uL — ABNORMAL LOW (ref 3.87–5.11)
Retic Count, Absolute: 94.2 10*3/uL (ref 19.0–186.0)
Retic Ct Pct: 3.8 % — ABNORMAL HIGH (ref 0.4–3.1)
Reticulocyte Hemoglobin: 34.4 pg (ref >27.9)

## 2019-07-27 LAB — BRAIN NATRIURETIC PEPTIDE: B Natriuretic Peptide: 92.6 pg/mL (ref 0.0–100.0)

## 2019-07-27 LAB — PHOSPHORUS: Phosphorus: 2.9 mg/dL (ref 2.5–4.6)

## 2019-07-27 LAB — PATHOLOGIST SMEAR REVIEW

## 2019-07-27 LAB — PROTIME-INR
INR: 1.5 — ABNORMAL HIGH (ref 0.8–1.2)
Prothrombin Time: 17.2 seconds — ABNORMAL HIGH (ref 11.4–15.2)

## 2019-07-27 LAB — ECHOCARDIOGRAM COMPLETE: Weight: 2627.2 oz

## 2019-07-27 LAB — FERRITIN: Ferritin: 55 ng/mL (ref 11–307)

## 2019-07-27 MED ORDER — FUROSEMIDE 10 MG/ML IJ SOLN
20.0000 mg | Freq: Once | INTRAMUSCULAR | Status: AC
  Administered 2019-07-28: 20 mg via INTRAVENOUS
  Filled 2019-07-27: qty 2

## 2019-07-27 MED ORDER — PANTOPRAZOLE SODIUM 40 MG IV SOLR
40.0000 mg | Freq: Two times a day (BID) | INTRAVENOUS | Status: DC
  Administered 2019-07-27 – 2019-08-10 (×29): 40 mg via INTRAVENOUS
  Filled 2019-07-27 (×29): qty 40

## 2019-07-27 NOTE — Consult Note (Signed)
Hematology/Oncology Consult note  [facility] [phone] [phone]08  Patient Care Team: Dione Housekeeperlmedo, Mario Ernesto, MD as PCP - General (Family Medicine)   Name of the patient: Kimberly ArbourDiane Wang  [id number]  03/19/1930   Date of visit: 07/27/19 REASON FOR COSULTATION:  Anemia History of presenting illness-  86 y.o. female with PMH listed at below, including dementia, CAD, hypertension who was sent to emergency room for altered mental status.  Patient cannot provide much history.  History was obtained from reviewing medical records. Patient was found to be more confused than her baseline.  Decreased oral intake and possible vomiting. UA was positive and urine culture showed E. coli.  Patient has leukocytosis, tachycardia, tachypnea and elevated lactic acid. Patient was admitted for sepsis due to UTI.  Altered mental status was considered to be secondary to metabolic encephalopathy.  Patient was started on antibiotics. Upon admission, patient has a hemoglobin of 12.3, MCV 89.  WBC 15.7. Noted that patient has a baseline chronic leukocytosis, no recent differential was checked. During her admission, patient has had daily CBC done which shows a progressively declining trend of her hemoglobin. Today is day 5 of hospitalization and hemoglobin has trended down to 7.8.  Patient has persistent leukocytosis with total white count of 16.6, predominantly neutrophilia.  There is increase of immature granulocytes. Iron panel showed ferritin of 55, saturation 11.  TIBC 263. Due to the progressive decrease of hemoglobin, there was suspicion of GI blood loss.  BUN was elevated GI recommend hematology work-up first.  Heme-onc was consulted. Patient currently denies any complaints.  Denies any dysuria, epigastric discomfort.   Review of Systems  Unable to perform ROS: Dementia    Allergies  Allergen Reactions  . Latex Hives and Rash  . Lidocaine Hcl Other (See  Comments)    Other Reaction: Other reaction. Patient can't take Novacaine  . Codeine Other (See Comments)  . Morphine Other (See Comments)  . Novocain [Procaine]     Heart races  . Other Other (See Comments)    Uncoded Allergy. Allergen: RED PEPPER    Patient Active Problem List   Diagnosis Date Noted  . Sepsis due to gram-negative UTI (HCC) 07/22/2019  . Severe sepsis (HCC) 07/22/2019  . Dementia due to Parkinson's disease without behavioral disturbance (HCC) 07/22/2019  . Benign essential HTN 07/22/2019  . CAD (coronary artery disease) 07/22/2019  . Pain due to onychomycosis of toenails of both feet 05/17/2019  . Coagulation defect (HCC) 05/17/2019     Past Medical History:  Diagnosis Date  . Coronary artery disease   . Dementia (HCC)   . Hypertension   . Myocardial infarct [facility](HCC)      Past Surgical History:  Procedure Laterality Date  . CHOLECYSTECTOMY    . CORONARY STENT PLACEMENT      Social History   Socioeconomic History  . Marital status: Married    Spouse name: Not on file  . Number of children: Not on file  . Years of education: Not on file  . Highest education level: Not on file  Occupational History  . Not on file  Tobacco Use  . Smoking status: Never Smoker  . Smokeless tobacco: Never Used  Substance and Sexual Activity  . Alcohol use: Never  . Drug use: Never  . Sexual activity: Not on file  Other Topics Concern  . Not on file  Social History Narrative  . Not on file   Social Determinants of Health   Financial Resource  Strain:   . Difficulty of Paying Living Expenses:   Food Insecurity:   . Worried About Programme researcher, broadcasting/film/video in the Last Year:   . Barista in the Last Year:   Transportation Needs:   . Freight forwarder (Medical):   Marland Kitchen Lack of Transportation (Non-Medical):   Physical Activity:   . Days of Exercise per Week:   . Minutes of Exercise per Session:   Stress:   . Feeling of Stress :   Social Connections:   .  Frequency of Communication with Friends and Family:   . Frequency of Social Gatherings with Friends and Family:   . Attends Religious Services:   . Active Member of Clubs or Organizations:   . Attends Banker Meetings:   Marland Kitchen Marital Status:   Intimate Partner Violence:   . Fear of Current or Ex-Partner:   . Emotionally Abused:   Marland Kitchen Physically Abused:   . Sexually Abused:      History reviewed. No pertinent family history.   Current Facility-Administered Medications:  .  acetaminophen (TYLENOL) tablet 650 mg, 650 mg, Oral, Q6H PRN, 650 mg at 07/25/19 2125 **OR** acetaminophen (TYLENOL) suppository 650 mg, 650 mg, Rectal, Q6H PRN, Mansy, Jan A, MD .  atorvastatin (LIPITOR) tablet 80 mg, 80 mg, Oral, Daily, Mansy, Jan A, MD, 80 mg at 07/27/19 0844 .  cephALEXin (KEFLEX) capsule 500 mg, 500 mg, Oral, Q6H, Sheikh, Omair Babb, DO, 500 mg at 07/27/19 1729 .  diltiazem (CARDIZEM CD) 24 hr capsule 240 mg, 240 mg, Oral, Daily, Mansy, Jan A, MD, 240 mg at 07/27/19 0844 .  donepezil (ARICEPT) tablet 10 mg, 10 mg, Oral, QHS, Mansy, Jan A, MD, 10 mg at 07/26/19 2303 .  ferrous sulfate tablet 325 mg, 325 mg, Oral, Q breakfast, Mansy, Jan A, MD, 325 mg at 07/27/19 0845 .  ipratropium-albuterol (DUONEB) 0.5-2.5 (3) MG/3ML nebulizer solution 3 mL, 3 mL, Nebulization, Q6H PRN, Jimmye Norman, NP, 3 mL at 07/23/19 1101 .  magnesium hydroxide (MILK OF MAGNESIA) suspension 30 mL, 30 mL, Oral, Daily PRN, Mansy, Jan A, MD .  megestrol (MEGACE) 400 MG/10ML suspension 400 mg, 400 mg, Oral, Daily, Drema Dallas, MD, 400 mg at 07/27/19 0843 .  memantine (NAMENDA) tablet 10 mg, 10 mg, Oral, Daily, Mansy, Jan A, MD, 10 mg at 07/27/19 0845 .  multivitamin with minerals tablet 1 tablet, 1 tablet, Oral, Daily, Valrie Hart A, RPH, 1 tablet at 07/27/19 0845 .  nitroGLYCERIN (NITROSTAT) SL tablet 0.4 mg, 0.4 mg, Sublingual, Q5 min PRN, Mansy, Jan A, MD .  ondansetron (ZOFRAN) tablet 4 mg, 4 mg, Oral,  Q6H PRN **OR** ondansetron (ZOFRAN) injection 4 mg, 4 mg, Intravenous, Q6H PRN, Mansy, Jan A, MD .  pantoprazole (PROTONIX) injection 40 mg, 40 mg, Intravenous, Q12H, Sheikh, Omair Latif, DO, 40 mg at 07/27/19 0843 .  potassium chloride (KLOR-CON) packet 40 mEq, 40 mEq, Oral, Once, Mansy, Jan A, MD .  potassium chloride SA (KLOR-CON) CR tablet 20 mEq, 20 mEq, Oral, Daily, Mansy, Jan A, MD, 20 mEq at 07/27/19 0845 .  QUEtiapine (SEROQUEL) tablet 25 mg, 25 mg, Oral, QHS PRN, Jimmye Norman, NP, 25 mg at 07/25/19 2125 .  traZODone (DESYREL) tablet 25 mg, 25 mg, Oral, QHS PRN, Mansy, Jan A, MD, 25 mg at 07/26/19 2304   Physical exam:  Vitals:   07/27/19 0459 07/27/19 0747 07/27/19 1130 07/27/19 1622  BP:  114/71 133/75 126/70  Pulse:  83  81 70  Resp:  Temp:  97.8 F (36.6 C) 97.8 F (36.6 C) (!) 97.1 F (36.2 C)  TempSrc:   Oral   SpO2:  100% 99% 99%  Weight: 166 lb (75.3 kg)     Height:       Physical Exam  Constitutional: No distress.  HENT:  Head: Normocephalic.  Eyes: No scleral icterus.  Pulmonary/Chest: Effort normal and breath sounds normal. She has no wheezes.  Abdominal: Soft. Bowel sounds are normal.  Musculoskeletal:        General: Normal range of motion.     Cervical back: Normal range of motion.  Neurological: She is alert.  Pleasantly confused  Skin: Skin is warm and dry.  Psychiatric: Mood normal.        CMP Latest Ref Rng & Units 07/27/2019  Glucose 70 - 99 mg/dL 161(W)  BUN 8 - 23 mg/dL 96(E)  Creatinine 4.54 - 1.00 mg/dL 0.98  Sodium 119 - 147 mmol/L 137  Potassium 3.5 - 5.1 mmol/L 4.3  Chloride 98 - 111 mmol/L 105  CO2 22 - 32 mmol/L 24  Calcium 8.9 - 10.3 mg/dL 8.2(N)  Total Protein 6.5 - 8.1 g/dL 5.0(L)  Total Bilirubin 0.3 - 1.2 mg/dL 0.7  Alkaline Phos 38 - 126 U/L 63  AST 15 - 41 U/L 27  ALT 0 - 44 U/L 30   CBC Latest Ref Rng & Units 07/27/2019  WBC 4.0 - 10.5 K/uL 16.6(H)  Hemoglobin 12.0 - 15.0 g/dL 7.8(L)  Hematocrit  36.0 - 46.0 % 24.2(L)  Platelets 150 - 400 K/uL 232    RADIOGRAPHIC STUDIES: I have personally reviewed the radiological images as listed and agreed with the findings in the report. CT ABDOMEN PELVIS WO CONTRAST  Result Date: 07/22/2019 CLINICAL DATA:  Sepsis EXAM: CT ABDOMEN AND PELVIS WITHOUT CONTRAST TECHNIQUE: Multidetector CT imaging of the abdomen and pelvis was performed following the standard protocol without IV contrast. COMPARISON:  None. FINDINGS: Lower chest: There is mild cardiomegaly. Mitral valve calcifications. A moderate paraesophageal hernia containing contrast is seen. Hepatobiliary: Although limited due to the lack of intravenous contrast, normal in appearance without gross focal abnormality. The patient is status post cholecystectomy. No biliary ductal dilation. Pancreas:  Unremarkable.  No surrounding inflammatory changes. Spleen: Normal in size. Although limited due to the lack of intravenous contrast, normal in appearance. Adrenals/Urinary Tract: Both adrenal glands appear normal. The kidneys and collecting system appear normal without evidence of urinary tract calculus or hydronephrosis. Bladder is unremarkable. Stomach/Bowel: The stomach, small bowel, are normal in appearance. There is scattered colonic diverticula without diverticulitis. Vascular/Lymphatic: There are no enlarged abdominal or pelvic lymph nodes. Scattered aortic atherosclerotic calcifications are seen without aneurysmal dilatation. Reproductive: The uterus and adnexa are unremarkable. Other: A small fat and transverse colon containing umbilical hernia seen within the mid abdomen. Musculoskeletal: No acute or significant osseous findings. IMPRESSION: Moderate paraesophageal hernia. Diverticulosis without diverticulitis. No other acute intra-abdominal or pelvic pathology to explain the patient's symptoms. Aortic Atherosclerosis (ICD10-I70.0). Electronically Signed   By: Jonna Clark M.D.   On: 07/22/2019 01:40   DG  Chest 1 View  Result Date: 07/27/2019 CLINICAL DATA:  Shortness of breath EXAM: CHEST  1 VIEW COMPARISON:  July 26, 2019 FINDINGS: There is unchanged mild cardiomegaly. There is aortic knob and descending intrathoracic aortic calcifications. There is slight interval improvement in the prominence of the central pulmonary vasculature. No acute osseous abnormality. IMPRESSION: Slight interval improvement in the pulmonary  vascular congestion. Electronically Signed   By: Jonna Clark M.D.   On: 07/27/2019 06:04   DG Chest 1 View  Result Date: 07/26/2019 CLINICAL DATA:  Shortness of breath. EXAM: CHEST  1 VIEW COMPARISON:  07/23/2019. FINDINGS: Mediastinum hilar structures normal. Cardiomegaly. Mild bilateral interstitial prominence and small pleural effusions. CHF could present in this fashion. Sliding hiatal hernia cannot be excluded. IMPRESSION: Cardiomegaly. Mild bilateral interstitial prominence and small pleural effusions. CHF could present in this fashion. Electronically Signed   By: Maisie Fus  Register   On: 07/26/2019 07:52   DG Chest 1 View  Result Date: 07/23/2019 CLINICAL DATA:  Dyspnea EXAM: CHEST  1 VIEW COMPARISON:  Chest radiograph from one day prior. FINDINGS: Stable cardiomediastinal silhouette with mild cardiomegaly. No pneumothorax. Stable small left pleural effusion. No right pleural effusion. No overt pulmonary edema. Streaky bibasilar lung opacities are similar IMPRESSION: Stable small left pleural effusion. Stable streaky bibasilar lung opacities, favor atelectasis or scarring. Stable mild cardiomegaly without overt pulmonary edema. Electronically Signed   By: Delbert Phenix M.D.   On: 07/23/2019 04:50   DG Chest 1 View  Result Date: 07/21/2019 CLINICAL DATA:  Sepsis. EXAM: CHEST  1 VIEW COMPARISON:  None. FINDINGS: There are small bilateral pleural effusions. There is a left perihilar density of unknown clinical significance. The heart size is mildly enlarged. There are dense aortic  calcifications. There are few streaky airspace opacities at the lung bases favored to represent areas of atelectasis. There is no pneumothorax. IMPRESSION: 1. Small bilateral pleural effusions. 2. Mild cardiomegaly. 3. Slightly prominent left perihilar region may be secondary to a dilated pulmonary artery or lymphadenopathy. A follow-up two-view chest x-ray is recommended in 4-6 weeks. Electronically Signed   By: Katherine Mantle M.D.   On: 07/21/2019 22:34   DG Chest Port 1 View  Result Date: 07/22/2019 CLINICAL DATA:  Shortness of breath. EXAM: PORTABLE CHEST 1 VIEW COMPARISON:  Jul 21, 2019 FINDINGS: Cardiomegaly. Skin fold over the lateral right chest. The hila and mediastinum are normal. No pneumothorax. No nodules or masses. No focal infiltrates. IMPRESSION: No active disease. Electronically Signed   By: Gerome Sam III M.D   On: 07/22/2019 15:12   ECHOCARDIOGRAM COMPLETE  Result Date: 07/27/2019    ECHOCARDIOGRAM REPORT   Patient Name:   SHERIDAN HEW Date of Exam: 07/26/2019 Medical Rec #:  [id number]   Height:       64.0 in Accession #:    [id number]  Weight:       164.2 lb Date of Birth:  09-23-1930    BSA:          1.799 m Patient Age:    86 years    BP:           117/65 mmHg Patient Gender: F           HR:           124 bpm. Exam Location:  [facility] Procedure: 2D Echo, Cardiac Doppler and Color Doppler Indications:     Dyspnea 786.09 / R06.00  History:         Patient has no prior history of Echocardiogram examinations.                  CAD; Risk Factors:Hypertension. MI.  Sonographer:     Neysa Bonito Roar Referring Phys:  [id number] Kateri Mc LATIF [facility] Diagnosing Phys: Lorine Bears MD IMPRESSIONS  1. Left ventricular ejection fraction, by estimation, is 40 to 45%. The left ventricle has mildly  decreased function. The left ventricle demonstrates regional wall motion abnormalities with possible anterior/anteroseptal hypokinesis. There is mild left ventricular hypertrophy. Left ventricular diastolic  parameters are indeterminate.  2. Right ventricular systolic function is normal. The right ventricular size is normal. There is mildly elevated pulmonary artery systolic pressure.  3. Left atrial size was severely dilated.  4. Right atrial size was moderately dilated.  5. The mitral valve is abnormal. Moderate mitral valve regurgitation. No evidence of mitral stenosis.  6. The aortic valve is abnormal. Aortic valve regurgitation is moderate. Mild aortic valve sclerosis is present, with no evidence of aortic valve stenosis. FINDINGS  Left Ventricle: Left ventricular ejection fraction, by estimation, is 40 to 45%. The left ventricle has mildly decreased function. The left ventricle demonstrates regional wall motion abnormalities. The left ventricular internal cavity size was normal in size. There is mild left ventricular hypertrophy. Left ventricular diastolic parameters are indeterminate. Right Ventricle: The right ventricular size is normal. No increase in right ventricular wall thickness. Right ventricular systolic function is normal. There is mildly elevated pulmonary artery systolic pressure. The tricuspid regurgitant velocity is 2.69  m/s, and with an assumed right atrial pressure of 10 mmHg, the estimated right ventricular systolic pressure is 38.9 mmHg. Left Atrium: Left atrial size was severely dilated. Right Atrium: Right atrial size was moderately dilated. Pericardium: There is no evidence of pericardial effusion. Mitral Valve: The mitral valve is abnormal. There is moderate thickening of the mitral valve leaflet(s). There is mild calcification of the mitral valve leaflet(s). Normal mobility of the mitral valve leaflets. Moderate mitral annular calcification. Moderate mitral valve regurgitation. No evidence of mitral valve stenosis. Tricuspid Valve: The tricuspid valve is normal in structure. Tricuspid valve regurgitation is not demonstrated. No evidence of tricuspid stenosis. Aortic Valve: The aortic valve  is abnormal. Aortic valve regurgitation is moderate. Mild aortic valve sclerosis is present, with no evidence of aortic valve stenosis. Aortic valve mean gradient measures 7.5 mmHg. Aortic valve peak gradient measures 14.1 mmHg. Aortic valve area, by VTI measures 1.73 cm. Pulmonic Valve: The pulmonic valve was normal in structure. Pulmonic valve regurgitation is not visualized. No evidence of pulmonic stenosis. Aorta: The aortic root is normal in size and structure. Venous: The inferior vena cava was not well visualized. IAS/Shunts: No atrial level shunt detected by color flow Doppler.  LEFT VENTRICLE PLAX 2D LVIDd:         4.47 cm  Diastology LVIDs:         3.12 cm  LV e' lateral:   6.64 cm/s LV PW:         1.14 cm  LV E/e' lateral: 23.8 LV IVS:        1.06 cm  LV e' medial:    8.81 cm/s LVOT diam:     1.70 cm  LV E/e' medial:  17.9 LV SV:         52 LV SV Index:   29 LVOT Area:     2.27 cm  RIGHT VENTRICLE RV Mid diam:    2.29 cm RV S prime:     12.70 cm/s TAPSE (M-mode): 1.6 cm LEFT ATRIUM             Index       RIGHT ATRIUM           Index LA diam:        4.20 cm 2.33 cm/m  RA Area:     19.00 cm LA Vol (A2C):   92.9 ml  51.64 ml/m RA Volume:   44.60 ml  24.79 ml/m LA Vol (A4C):   82.8 ml 46.02 ml/m LA Biplane Vol: 91.4 ml 50.80 ml/m  AORTIC VALVE                    PULMONIC VALVE AV Area (Vmax):    1.54 cm     PV Vmax:        1.10 m/s AV Area (Vmean):   1.56 cm     PV Peak grad:   4.8 mmHg AV Area (VTI):     1.73 cm     RVOT Peak grad: 2 mmHg AV Vmax:           187.50 cm/s AV Vmean:          123.000 cm/s AV VTI:            0.301 m AV Peak Grad:      14.1 mmHg AV Mean Grad:      7.5 mmHg LVOT Vmax:         127.00 cm/s LVOT Vmean:        84.500 cm/s LVOT VTI:          0.229 m LVOT/AV VTI ratio: 0.76  AORTA Ao Root diam: 2.20 cm MITRAL VALVE                TRICUSPID VALVE MV Area (PHT): 3.16 cm     TR Peak grad:   28.9 mmHg MV Decel Time: 240 msec     TR Vmax:        269.00 cm/s MV E velocity: 158.00  cm/s                             SHUNTS                             Systemic VTI:  0.23 m                             Systemic Diam: 1.70 cm Kathlyn Sacramento MD Electronically signed by Kathlyn Sacramento MD Signature Date/Time: 07/27/2019/8:13:34 AM    Final     Assessment and plan- Patient is a 86 y.o. female who is currently admitted due to altered mental status, UTI and progressively worsening of hemoglobin.  #Acute onset of anemia, Iron panel is not typical for iron deficiency.  Ferritin is 55. Normal vitamin B12 and folate level.-Check MMA. Reticulocyte panel shows normal reticulocyte hemoglobin, indicating adequate iron stores. Increased reticulocytosis which can be seen in acute GI bleeding, or hemolysis. Patient's hemoglobin is normal, less likely hemolysis.  Check LDH and haptoglobin. Keep active type and screen, monitor CBC daily.   #Leukocytosis, most likely secondary to UTI.  Patient also has baseline leukocytosis.  Not on steroids. Discussed with pathologist Dr. Liliane Bade who reviewed patient's CBC smear. No blasts or fragmented RBCs.  Reactive leukoerythroblastic changes.  Blood loss versus hemolysis.  Thank you for allowing me to participate in the care of this patient.   Earlie Server, MD, PhD Hematology Oncology [facility] at [facility] Pager- [phone] 07/27/2019

## 2019-07-27 NOTE — Progress Notes (Signed)
Patient Name: Kimberly Wang Date of Encounter: 07/27/2019  Hospital Problem List     Active Problems:   Coagulation defect (HCC)   Sepsis due to gram-negative UTI (HCC)   Severe sepsis (HCC)   Dementia due to Parkinson's disease without behavioral disturbance (HCC)   Benign essential HTN   CAD (coronary artery disease)    Patient Profile     86 year old female with history of HFpEF followed at [facility], history of coronary artery disease status post PCI approximately 15 years ago, history atrial fibrillation rate controlled and anticoagulated with Eliquis.  Rate control with carvedilol and diltiazem admitted with UTI sepsis.  She refused she aggressive fluid resuscitation  resulting in evidence of volume overload.  She is currently asymptomatic on my exam today.  She is hemodynamically stable and has a pulse ox of 99% on room air  Subjective   No chest pain or shortness of breath.  Somewhat difficult historian.  Inpatient Medications    . atorvastatin  80 mg Oral Daily  . cephALEXin  500 mg Oral Q6H  . diltiazem  240 mg Oral Daily  . donepezil  10 mg Oral QHS  . ferrous sulfate  325 mg Oral Q breakfast  . megestrol  400 mg Oral Daily  . memantine  10 mg Oral Daily  . multivitamin with minerals  1 tablet Oral Daily  . pantoprazole (PROTONIX) IV  40 mg Intravenous Q12H  . potassium chloride  40 mEq Oral Once  . potassium chloride SA  20 mEq Oral Daily    Vital Signs    Vitals:   07/27/19 0419 07/27/19 0459 07/27/19 0747 07/27/19 1130  BP: (!) 155/68  114/71 133/75  Pulse: 81  83 81  Resp: (!) 21  20 18   Temp: 97.7 F (36.5 C)  97.8 F (36.6 C) 97.8 F (36.6 C)  TempSrc: Oral   Oral  SpO2: 100%  100% 99%  Weight:  75.3 kg    Height:        Intake/Output Summary (Last 24 hours) at 07/27/2019 1531 Last data filed at 07/27/2019 1300 Gross per 24 hour  Intake 910 ml  Output 900 ml  Net 10 ml   Filed Weights   07/23/19 0500 07/25/19 0406  07/27/19 0459  Weight: 72.7 kg 74.5 kg 75.3 kg    Physical Exam    GEN: Well nourished, well developed, in no acute distress.  HEENT: normal.  Neck: Supple, no JVD, carotid bruits, or masses. Cardiac: Irregular regular rhythm Respiratory:  Respirations regular and unlabored, clear to auscultation bilaterally. GI: Soft, nontender, nondistended, BS + x 4. MS: no deformity or atrophy. Skin: warm and dry, no rash. Neuro:  Strength and sensation are intact. Psych: Normal affect.  Labs    CBC Recent Labs    07/26/19 0443 07/27/19 0447  WBC 15.6* 16.6*  NEUTROABS 12.3* 13.0*  HGB 8.5* 7.8*  HCT 25.8* 24.2*  MCV 94.2 96.4  PLT 202 232   Basic Metabolic Panel Recent Labs    09/26/19 0443 07/27/19 0447  NA 138 137  K 4.8 4.3  CL 107 105  CO2 25 24  GLUCOSE 133* 134*  BUN 50* 34*  CREATININE 0.66 0.80  CALCIUM 8.5* 8.6*  MG 2.0 2.1  PHOS 2.7 2.9   Liver Function Tests Recent Labs    07/26/19 0443 07/27/19 0447  AST 26 27  ALT 30 30  ALKPHOS 60 63  BILITOT 0.4 0.7  PROT 4.9* 5.0*  ALBUMIN 2.3* 2.3*   No results for input(s): LIPASE, AMYLASE in the last 72 hours. Cardiac Enzymes No results for input(s): CKTOTAL, CKMB, CKMBINDEX, TROPONINI in the last 72 hours. BNP Recent Labs    07/26/19 0443 07/27/19 0545  BNP 125.1* 92.6   D-Dimer No results for input(s): DDIMER in the last 72 hours. Hemoglobin A1C No results for input(s): HGBA1C in the last 72 hours. Fasting Lipid Panel No results for input(s): CHOL, HDL, LDLCALC, TRIG, CHOLHDL, LDLDIRECT in the last 72 hours. Thyroid Function Tests No results for input(s): TSH, T4TOTAL, T3FREE, THYROIDAB in the last 72 hours.  Invalid input(s): FREET3  Telemetry    Atrial fibrillation: Ventricular response  ECG    Atrial fibrillation with controlled ventricular response, no ischemia.  Radiology    CT ABDOMEN PELVIS WO CONTRAST  Result Date: 07/22/2019 CLINICAL DATA:  Sepsis EXAM: CT ABDOMEN AND PELVIS  WITHOUT CONTRAST TECHNIQUE: Multidetector CT imaging of the abdomen and pelvis was performed following the standard protocol without IV contrast. COMPARISON:  None. FINDINGS: Lower chest: There is mild cardiomegaly. Mitral valve calcifications. A moderate paraesophageal hernia containing contrast is seen. Hepatobiliary: Although limited due to the lack of intravenous contrast, normal in appearance without gross focal abnormality. The patient is status post cholecystectomy. No biliary ductal dilation. Pancreas:  Unremarkable.  No surrounding inflammatory changes. Spleen: Normal in size. Although limited due to the lack of intravenous contrast, normal in appearance. Adrenals/Urinary Tract: Both adrenal glands appear normal. The kidneys and collecting system appear normal without evidence of urinary tract calculus or hydronephrosis. Bladder is unremarkable. Stomach/Bowel: The stomach, small bowel, are normal in appearance. There is scattered colonic diverticula without diverticulitis. Vascular/Lymphatic: There are no enlarged abdominal or pelvic lymph nodes. Scattered aortic atherosclerotic calcifications are seen without aneurysmal dilatation. Reproductive: The uterus and adnexa are unremarkable. Other: A small fat and transverse colon containing umbilical hernia seen within the mid abdomen. Musculoskeletal: No acute or significant osseous findings. IMPRESSION: Moderate paraesophageal hernia. Diverticulosis without diverticulitis. No other acute intra-abdominal or pelvic pathology to explain the patient's symptoms. Aortic Atherosclerosis (ICD10-I70.0). Electronically Signed   By: Jonna Clark M.D.   On: 07/22/2019 01:40   DG Chest 1 View  Result Date: 07/27/2019 CLINICAL DATA:  Shortness of breath EXAM: CHEST  1 VIEW COMPARISON:  July 26, 2019 FINDINGS: There is unchanged mild cardiomegaly. There is aortic knob and descending intrathoracic aortic calcifications. There is slight interval improvement in the prominence  of the central pulmonary vasculature. No acute osseous abnormality. IMPRESSION: Slight interval improvement in the pulmonary vascular congestion. Electronically Signed   By: Jonna Clark M.D.   On: 07/27/2019 06:04   DG Chest 1 View  Result Date: 07/26/2019 CLINICAL DATA:  Shortness of breath. EXAM: CHEST  1 VIEW COMPARISON:  07/23/2019. FINDINGS: Mediastinum hilar structures normal. Cardiomegaly. Mild bilateral interstitial prominence and small pleural effusions. CHF could present in this fashion. Sliding hiatal hernia cannot be excluded. IMPRESSION: Cardiomegaly. Mild bilateral interstitial prominence and small pleural effusions. CHF could present in this fashion. Electronically Signed   By: Maisie Fus  Register   On: 07/26/2019 07:52   DG Chest 1 View  Result Date: 07/23/2019 CLINICAL DATA:  Dyspnea EXAM: CHEST  1 VIEW COMPARISON:  Chest radiograph from one day prior. FINDINGS: Stable cardiomediastinal silhouette with mild cardiomegaly. No pneumothorax. Stable small left pleural effusion. No right pleural effusion. No overt pulmonary edema. Streaky bibasilar lung opacities are similar IMPRESSION: Stable small left pleural effusion. Stable streaky bibasilar lung opacities, favor atelectasis  or scarring. Stable mild cardiomegaly without overt pulmonary edema. Electronically Signed   By: Delbert Phenix M.D.   On: 07/23/2019 04:50   DG Chest 1 View  Result Date: 07/21/2019 CLINICAL DATA:  Sepsis. EXAM: CHEST  1 VIEW COMPARISON:  None. FINDINGS: There are small bilateral pleural effusions. There is a left perihilar density of unknown clinical significance. The heart size is mildly enlarged. There are dense aortic calcifications. There are few streaky airspace opacities at the lung bases favored to represent areas of atelectasis. There is no pneumothorax. IMPRESSION: 1. Small bilateral pleural effusions. 2. Mild cardiomegaly. 3. Slightly prominent left perihilar region may be secondary to a dilated pulmonary  artery or lymphadenopathy. A follow-up two-view chest x-ray is recommended in 4-6 weeks. Electronically Signed   By: Katherine Mantle M.D.   On: 07/21/2019 22:34   DG Chest Port 1 View  Result Date: 07/22/2019 CLINICAL DATA:  Shortness of breath. EXAM: PORTABLE CHEST 1 VIEW COMPARISON:  Jul 21, 2019 FINDINGS: Cardiomegaly. Skin fold over the lateral right chest. The hila and mediastinum are normal. No pneumothorax. No nodules or masses. No focal infiltrates. IMPRESSION: No active disease. Electronically Signed   By: Gerome Sam III M.D   On: 07/22/2019 15:12   ECHOCARDIOGRAM COMPLETE  Result Date: 07/27/2019    ECHOCARDIOGRAM REPORT   Patient Name:   STEPHENY CANAL Date of Exam: 07/26/2019 Medical Rec #:  [id number]   Height:       64.0 in Accession #:    [id number]  Weight:       164.2 lb Date of Birth:  1930/06/10    BSA:          1.799 m Patient Age:    88 years    BP:           117/65 mmHg Patient Gender: F           HR:           124 bpm. Exam Location:  [facility] Procedure: 2D Echo, Cardiac Doppler and Color Doppler Indications:     Dyspnea 786.09 / R06.00  History:         Patient has no prior history of Echocardiogram examinations.                  CAD; Risk Factors:Hypertension. MI.  Sonographer:     Neysa Bonito Roar Referring Phys:  [id number] Kateri Mc LATIF [facility] Diagnosing Phys: Lorine Bears MD IMPRESSIONS  1. Left ventricular ejection fraction, by estimation, is 40 to 45%. The left ventricle has mildly decreased function. The left ventricle demonstrates regional wall motion abnormalities with possible anterior/anteroseptal hypokinesis. There is mild left ventricular hypertrophy. Left ventricular diastolic parameters are indeterminate.  2. Right ventricular systolic function is normal. The right ventricular size is normal. There is mildly elevated pulmonary artery systolic pressure.  3. Left atrial size was severely dilated.  4. Right atrial size was moderately dilated.  5. The mitral valve is abnormal.  Moderate mitral valve regurgitation. No evidence of mitral stenosis.  6. The aortic valve is abnormal. Aortic valve regurgitation is moderate. Mild aortic valve sclerosis is present, with no evidence of aortic valve stenosis. FINDINGS  Left Ventricle: Left ventricular ejection fraction, by estimation, is 40 to 45%. The left ventricle has mildly decreased function. The left ventricle demonstrates regional wall motion abnormalities. The left ventricular internal cavity size was normal in size. There is mild left ventricular hypertrophy. Left ventricular diastolic parameters are indeterminate. Right Ventricle: The right ventricular size  is normal. No increase in right ventricular wall thickness. Right ventricular systolic function is normal. There is mildly elevated pulmonary artery systolic pressure. The tricuspid regurgitant velocity is 2.69  m/s, and with an assumed right atrial pressure of 10 mmHg, the estimated right ventricular systolic pressure is 38.9 mmHg. Left Atrium: Left atrial size was severely dilated. Right Atrium: Right atrial size was moderately dilated. Pericardium: There is no evidence of pericardial effusion. Mitral Valve: The mitral valve is abnormal. There is moderate thickening of the mitral valve leaflet(s). There is mild calcification of the mitral valve leaflet(s). Normal mobility of the mitral valve leaflets. Moderate mitral annular calcification. Moderate mitral valve regurgitation. No evidence of mitral valve stenosis. Tricuspid Valve: The tricuspid valve is normal in structure. Tricuspid valve regurgitation is not demonstrated. No evidence of tricuspid stenosis. Aortic Valve: The aortic valve is abnormal. Aortic valve regurgitation is moderate. Mild aortic valve sclerosis is present, with no evidence of aortic valve stenosis. Aortic valve mean gradient measures 7.5 mmHg. Aortic valve peak gradient measures 14.1 mmHg. Aortic valve area, by VTI measures 1.73 cm. Pulmonic Valve: The pulmonic  valve was normal in structure. Pulmonic valve regurgitation is not visualized. No evidence of pulmonic stenosis. Aorta: The aortic root is normal in size and structure. Venous: The inferior vena cava was not well visualized. IAS/Shunts: No atrial level shunt detected by color flow Doppler.  LEFT VENTRICLE PLAX 2D LVIDd:         4.47 cm  Diastology LVIDs:         3.12 cm  LV e' lateral:   6.64 cm/s LV PW:         1.14 cm  LV E/e' lateral: 23.8 LV IVS:        1.06 cm  LV e' medial:    8.81 cm/s LVOT diam:     1.70 cm  LV E/e' medial:  17.9 LV SV:         52 LV SV Index:   29 LVOT Area:     2.27 cm  RIGHT VENTRICLE RV Mid diam:    2.29 cm RV S prime:     12.70 cm/s TAPSE (M-mode): 1.6 cm LEFT ATRIUM             Index       RIGHT ATRIUM           Index LA diam:        4.20 cm 2.33 cm/m  RA Area:     19.00 cm LA Vol (A2C):   92.9 ml 51.64 ml/m RA Volume:   44.60 ml  24.79 ml/m LA Vol (A4C):   82.8 ml 46.02 ml/m LA Biplane Vol: 91.4 ml 50.80 ml/m  AORTIC VALVE                    PULMONIC VALVE AV Area (Vmax):    1.54 cm     PV Vmax:        1.10 m/s AV Area (Vmean):   1.56 cm     PV Peak grad:   4.8 mmHg AV Area (VTI):     1.73 cm     RVOT Peak grad: 2 mmHg AV Vmax:           187.50 cm/s AV Vmean:          123.000 cm/s AV VTI:            0.301 m AV Peak Grad:      14.1 mmHg AV  Mean Grad:      7.5 mmHg LVOT Vmax:         127.00 cm/s LVOT Vmean:        84.500 cm/s LVOT VTI:          0.229 m LVOT/AV VTI ratio: 0.76  AORTA Ao Root diam: 2.20 cm MITRAL VALVE                TRICUSPID VALVE MV Area (PHT): 3.16 cm     TR Peak grad:   28.9 mmHg MV Decel Time: 240 msec     TR Vmax:        269.00 cm/s MV E velocity: 158.00 cm/s                             SHUNTS                             Systemic VTI:  0.23 m                             Systemic Diam: 1.70 cm Lorine BearsMuhammad Arida MD Electronically signed by Lorine BearsMuhammad Arida MD Signature Date/Time: 07/27/2019/8:13:34 AM    Final     Assessment & Plan    86 y.o. female with  history of heart failure with preserved ejection fraction, chronic atrial fibrillation, coronary artery disease, dementia who is [facility] who was admitted with weakness and acute onset of confusion worse over her baseline.  She had a cardiac MRI in 2011 showing normal RV and LV function.  There was a left atrial thrombus with no evidence of myocardial infarction.  She had a non-STEMI in 2006 with cardiac cath revealing distal LAD disease not amenable to PCI.  She had a PCI of the RCA with a Taxus stent.  This was done in 2006 earlier.  She has been on rate control with diltiazem for her A. fib.  She has been on Eliquis for anticoagulation.  She was diagnosed with both mixed Alzheimer's and vascular dementia in 2015.  Her most recent outpatient regimen included apixaban 5 mg twice daily, atorvastatin 80 mg daily, carvedilol 6.25 mg twice daily, diltiazem 240 mg daily, furosemide 80 mg twice daily, spironolactone 25 mg daily along with potassium chloride to 20 mEq daily.  She was last seen by her [facility] cardiology team approximately 1 year ago.  1. HFpEF-EF has been well[facility] with [facility] and LV function.  Chest x-ray did not show pulmonary edema on admission.  Fluid status appears to be over hydration.  She does not appear acutely in congestive heart failure at present.  Heart rate is controlled and she is oxygenating well.  Would continue to carefully diurese following renal function and symptoms.  BNP was mildly elevated.  Echo done during this admission was read as showing an EF of 40 to 45% with moderate MR and AI.  This does not appear critical.  Likely secondary to overhydration.  We will continue to closely follow.  Does not clinically appear to be in heart failure at present.  He has plus approximately 6 and half liters still from admission.   2.  Atrial fibrillation-rate appears well controlled at present heart rate  was 75 on my exam.  We will continue with diltiazem and  follow.  Continue with Eliquis at 5 mg twice daily as you are doing.  3.  Sepsis-continue with antibiotics and follow.  4.  Dementia-has mixed vascular and Alzheimer's dementia.  Per chart, appears to be getting near her baseline.  5.  Coronary artery disease-status post PCI.  This was done in 2006.  No clinical evidence of ischemia.  Patient denies chest pain at present.  Not a candidate for invasive evaluation at present.  Cardiac markers are unremarkable.  6.  Hemoglobin is dropped which may be secondary to hydration.  GI work-up noted.  Would agree.  Patient appears nearing baseline.  Will evaluate the morning but consideration for discharge if improvement continues.  Outpatient follow-up as needed.  We will follow with you.  Signed, Javier Docker Arynn Armand MD 07/27/2019, 3:31 PM  Pager: [phone]

## 2019-07-27 NOTE — Progress Notes (Signed)
Patient Name: Kimberly Wang Date of Encounter: 07/27/2019  Hospital Problem List     Active Problems:   Coagulation defect (HCC)   Sepsis due to gram-negative UTI (HCC)   Severe sepsis (HCC)   Dementia due to Parkinson's disease without behavioral disturbance (HCC)   Benign essential HTN   CAD (coronary artery disease)    Patient Profile     86 year old female with history of HFpEF followed at [facility], history of coronary artery disease status post PCI approximately 15 years ago, history atrial fibrillation rate controlled and anticoagulated with Eliquis. Rate control with carvedilol and diltiazem admitted with UTI sepsis. She received a lot of fluid initially and developed volume overload. Has improved with careful diuresis. HGB has dropped. Concern over bleeding. Appreciated GI and hematology assistance.   Subjective   Denies sob  Inpatient Medications    . atorvastatin  80 mg Oral Daily  . cephALEXin  500 mg Oral Q6H  . diltiazem  240 mg Oral Daily  . donepezil  10 mg Oral QHS  . ferrous sulfate  325 mg Oral Q breakfast  . megestrol  400 mg Oral Daily  . memantine  10 mg Oral Daily  . multivitamin with minerals  1 tablet Oral Daily  . pantoprazole (PROTONIX) IV  40 mg Intravenous Q12H  . potassium chloride  40 mEq Oral Once  . potassium chloride SA  20 mEq Oral Daily    Vital Signs    Vitals:   07/27/19 0419 07/27/19 0459 07/27/19 0747 07/27/19 1130  BP: (!) 155/68  114/71 133/75  Pulse: 81  83 81  Resp: (!) 21  20 18   Temp: 97.7 F (36.5 C)  97.8 F (36.6 C) 97.8 F (36.6 C)  TempSrc: Oral   Oral  SpO2: 100%  100% 99%  Weight:  75.3 kg    Height:        Intake/Output Summary (Last 24 hours) at 07/27/2019 1527 Last data filed at 07/27/2019 1300 Gross per 24 hour  Intake 910 ml  Output 900 ml  Net 10 ml   Filed Weights   07/23/19 0500 07/25/19 0406 07/27/19 0459  Weight: 72.7 kg 74.5 kg 75.3 kg    Physical Exam    GEN: Well  nourished, well developed, in no acute distress.  HEENT: normal.  Neck: Supple, no JVD, carotid bruits, or masses. Cardiac: irr, irr Respiratory:  Respirations regular and unlabored, clear to auscultation bilaterally. GI: Soft, nontender, nondistended, BS + x 4. MS: no deformity or atrophy. Skin: warm and dry, no rash. Neuro:  Strength and sensation are intact. Psych: Normal affect.  Labs    CBC Recent Labs    07/26/19 0443 07/27/19 0447  WBC 15.6* 16.6*  NEUTROABS 12.3* 13.0*  HGB 8.5* 7.8*  HCT 25.8* 24.2*  MCV 94.2 96.4  PLT 202 232   Basic Metabolic Panel Recent Labs    09/26/19 0443 07/27/19 0447  NA 138 137  K 4.8 4.3  CL 107 105  CO2 25 24  GLUCOSE 133* 134*  BUN 50* 34*  CREATININE 0.66 0.80  CALCIUM 8.5* 8.6*  MG 2.0 2.1  PHOS 2.7 2.9   Liver Function Tests Recent Labs    07/26/19 0443 07/27/19 0447  AST 26 27  ALT 30 30  ALKPHOS 60 63  BILITOT 0.4 0.7  PROT 4.9* 5.0*  ALBUMIN 2.3* 2.3*   No results for input(s): LIPASE, AMYLASE in the last 72 hours. Cardiac Enzymes No results for  input(s): CKTOTAL, CKMB, CKMBINDEX, TROPONINI in the last 72 hours. BNP Recent Labs    07/26/19 0443 07/27/19 0545  BNP 125.1* 92.6   D-Dimer No results for input(s): DDIMER in the last 72 hours. Hemoglobin A1C No results for input(s): HGBA1C in the last 72 hours. Fasting Lipid Panel No results for input(s): CHOL, HDL, LDLCALC, TRIG, CHOLHDL, LDLDIRECT in the last 72 hours. Thyroid Function Tests No results for input(s): TSH, T4TOTAL, T3FREE, THYROIDAB in the last 72 hours.  Invalid input(s): FREET3  Telemetry    afib  ECG  afib with rvr  Radiology    CT ABDOMEN PELVIS WO CONTRAST  Result Date: 07/22/2019 CLINICAL DATA:  Sepsis EXAM: CT ABDOMEN AND PELVIS WITHOUT CONTRAST TECHNIQUE: Multidetector CT imaging of the abdomen and pelvis was performed following the standard protocol without IV contrast. COMPARISON:  None. FINDINGS: Lower chest: There  is mild cardiomegaly. Mitral valve calcifications. A moderate paraesophageal hernia containing contrast is seen. Hepatobiliary: Although limited due to the lack of intravenous contrast, normal in appearance without gross focal abnormality. The patient is status post cholecystectomy. No biliary ductal dilation. Pancreas:  Unremarkable.  No surrounding inflammatory changes. Spleen: Normal in size. Although limited due to the lack of intravenous contrast, normal in appearance. Adrenals/Urinary Tract: Both adrenal glands appear normal. The kidneys and collecting system appear normal without evidence of urinary tract calculus or hydronephrosis. Bladder is unremarkable. Stomach/Bowel: The stomach, small bowel, are normal in appearance. There is scattered colonic diverticula without diverticulitis. Vascular/Lymphatic: There are no enlarged abdominal or pelvic lymph nodes. Scattered aortic atherosclerotic calcifications are seen without aneurysmal dilatation. Reproductive: The uterus and adnexa are unremarkable. Other: A small fat and transverse colon containing umbilical hernia seen within the mid abdomen. Musculoskeletal: No acute or significant osseous findings. IMPRESSION: Moderate paraesophageal hernia. Diverticulosis without diverticulitis. No other acute intra-abdominal or pelvic pathology to explain the patient's symptoms. Aortic Atherosclerosis (ICD10-I70.0). Electronically Signed   By: Jonna Clark M.D.   On: 07/22/2019 01:40   DG Chest 1 View  Result Date: 07/27/2019 CLINICAL DATA:  Shortness of breath EXAM: CHEST  1 VIEW COMPARISON:  July 26, 2019 FINDINGS: There is unchanged mild cardiomegaly. There is aortic knob and descending intrathoracic aortic calcifications. There is slight interval improvement in the prominence of the central pulmonary vasculature. No acute osseous abnormality. IMPRESSION: Slight interval improvement in the pulmonary vascular congestion. Electronically Signed   By: Jonna Clark M.D.    On: 07/27/2019 06:04   DG Chest 1 View  Result Date: 07/26/2019 CLINICAL DATA:  Shortness of breath. EXAM: CHEST  1 VIEW COMPARISON:  07/23/2019. FINDINGS: Mediastinum hilar structures normal. Cardiomegaly. Mild bilateral interstitial prominence and small pleural effusions. CHF could present in this fashion. Sliding hiatal hernia cannot be excluded. IMPRESSION: Cardiomegaly. Mild bilateral interstitial prominence and small pleural effusions. CHF could present in this fashion. Electronically Signed   By: Maisie Fus  Register   On: 07/26/2019 07:52   DG Chest 1 View  Result Date: 07/23/2019 CLINICAL DATA:  Dyspnea EXAM: CHEST  1 VIEW COMPARISON:  Chest radiograph from one day prior. FINDINGS: Stable cardiomediastinal silhouette with mild cardiomegaly. No pneumothorax. Stable small left pleural effusion. No right pleural effusion. No overt pulmonary edema. Streaky bibasilar lung opacities are similar IMPRESSION: Stable small left pleural effusion. Stable streaky bibasilar lung opacities, favor atelectasis or scarring. Stable mild cardiomegaly without overt pulmonary edema. Electronically Signed   By: Delbert Phenix M.D.   On: 07/23/2019 04:50   DG Chest 1 View  Result  Date: 07/21/2019 CLINICAL DATA:  Sepsis. EXAM: CHEST  1 VIEW COMPARISON:  None. FINDINGS: There are small bilateral pleural effusions. There is a left perihilar density of unknown clinical significance. The heart size is mildly enlarged. There are dense aortic calcifications. There are few streaky airspace opacities at the lung bases favored to represent areas of atelectasis. There is no pneumothorax. IMPRESSION: 1. Small bilateral pleural effusions. 2. Mild cardiomegaly. 3. Slightly prominent left perihilar region may be secondary to a dilated pulmonary artery or lymphadenopathy. A follow-up two-view chest x-ray is recommended in 4-6 weeks. Electronically Signed   By: Constance Holster M.D.   On: 07/21/2019 22:34   DG Chest Port 1 View  Result  Date: 07/22/2019 CLINICAL DATA:  Shortness of breath. EXAM: PORTABLE CHEST 1 VIEW COMPARISON:  Jul 21, 2019 FINDINGS: Cardiomegaly. Skin fold over the lateral right chest. The hila and mediastinum are normal. No pneumothorax. No nodules or masses. No focal infiltrates. IMPRESSION: No active disease. Electronically Signed   By: Dorise Bullion III M.D   On: 07/22/2019 15:12   ECHOCARDIOGRAM COMPLETE  Result Date: 07/27/2019    ECHOCARDIOGRAM REPORT   Patient Name:   Kimberly Wang Date of Exam: 07/26/2019 Medical Rec #:  [id number]   Height:       64.0 in Accession #:    [id number]  Weight:       164.2 lb Date of Birth:  04/29/1930    BSA:          1.799 m Patient Age:    14 years    BP:           117/65 mmHg Patient Gender: F           HR:           124 bpm. Exam Location:  [facility] Procedure: 2D Echo, Cardiac Doppler and Color Doppler Indications:     Dyspnea 786.09 / R06.00  History:         Patient has no prior history of Echocardiogram examinations.                  CAD; Risk Factors:Hypertension. MI.  Sonographer:     Alyse Low Roar Referring Phys:  [id number] Georgina Quint LATIF [facility] Diagnosing Phys: Kathlyn Sacramento MD IMPRESSIONS  1. Left ventricular ejection fraction, by estimation, is 40 to 45%. The left ventricle has mildly decreased function. The left ventricle demonstrates regional wall motion abnormalities with possible anterior/anteroseptal hypokinesis. There is mild left ventricular hypertrophy. Left ventricular diastolic parameters are indeterminate.  2. Right ventricular systolic function is normal. The right ventricular size is normal. There is mildly elevated pulmonary artery systolic pressure.  3. Left atrial size was severely dilated.  4. Right atrial size was moderately dilated.  5. The mitral valve is abnormal. Moderate mitral valve regurgitation. No evidence of mitral stenosis.  6. The aortic valve is abnormal. Aortic valve regurgitation is moderate. Mild aortic valve sclerosis is present, with no evidence of  aortic valve stenosis. FINDINGS  Left Ventricle: Left ventricular ejection fraction, by estimation, is 40 to 45%. The left ventricle has mildly decreased function. The left ventricle demonstrates regional wall motion abnormalities. The left ventricular internal cavity size was normal in size. There is mild left ventricular hypertrophy. Left ventricular diastolic parameters are indeterminate. Right Ventricle: The right ventricular size is normal. No increase in right ventricular wall thickness. Right ventricular systolic function is normal. There is mildly elevated pulmonary artery systolic pressure. The tricuspid regurgitant velocity is 2.69  m/s,  and with an assumed right atrial pressure of 10 mmHg, the estimated right ventricular systolic pressure is 38.9 mmHg. Left Atrium: Left atrial size was severely dilated. Right Atrium: Right atrial size was moderately dilated. Pericardium: There is no evidence of pericardial effusion. Mitral Valve: The mitral valve is abnormal. There is moderate thickening of the mitral valve leaflet(s). There is mild calcification of the mitral valve leaflet(s). Normal mobility of the mitral valve leaflets. Moderate mitral annular calcification. Moderate mitral valve regurgitation. No evidence of mitral valve stenosis. Tricuspid Valve: The tricuspid valve is normal in structure. Tricuspid valve regurgitation is not demonstrated. No evidence of tricuspid stenosis. Aortic Valve: The aortic valve is abnormal. Aortic valve regurgitation is moderate. Mild aortic valve sclerosis is present, with no evidence of aortic valve stenosis. Aortic valve mean gradient measures 7.5 mmHg. Aortic valve peak gradient measures 14.1 mmHg. Aortic valve area, by VTI measures 1.73 cm. Pulmonic Valve: The pulmonic valve was normal in structure. Pulmonic valve regurgitation is not visualized. No evidence of pulmonic stenosis. Aorta: The aortic root is normal in size and structure. Venous: The inferior vena cava  was not well visualized. IAS/Shunts: No atrial level shunt detected by color flow Doppler.  LEFT VENTRICLE PLAX 2D LVIDd:         4.47 cm  Diastology LVIDs:         3.12 cm  LV e' lateral:   6.64 cm/s LV PW:         1.14 cm  LV E/e' lateral: 23.8 LV IVS:        1.06 cm  LV e' medial:    8.81 cm/s LVOT diam:     1.70 cm  LV E/e' medial:  17.9 LV SV:         52 LV SV Index:   29 LVOT Area:     2.27 cm  RIGHT VENTRICLE RV Mid diam:    2.29 cm RV S prime:     12.70 cm/s TAPSE (M-mode): 1.6 cm LEFT ATRIUM             Index       RIGHT ATRIUM           Index LA diam:        4.20 cm 2.33 cm/m  RA Area:     19.00 cm LA Vol (A2C):   92.9 ml 51.64 ml/m RA Volume:   44.60 ml  24.79 ml/m LA Vol (A4C):   82.8 ml 46.02 ml/m LA Biplane Vol: 91.4 ml 50.80 ml/m  AORTIC VALVE                    PULMONIC VALVE AV Area (Vmax):    1.54 cm     PV Vmax:        1.10 m/s AV Area (Vmean):   1.56 cm     PV Peak grad:   4.8 mmHg AV Area (VTI):     1.73 cm     RVOT Peak grad: 2 mmHg AV Vmax:           187.50 cm/s AV Vmean:          123.000 cm/s AV VTI:            0.301 m AV Peak Grad:      14.1 mmHg AV Mean Grad:      7.5 mmHg LVOT Vmax:         127.00 cm/s LVOT Vmean:        84.500  cm/s LVOT VTI:          0.229 m LVOT/AV VTI ratio: 0.76  AORTA Ao Root diam: 2.20 cm MITRAL VALVE                TRICUSPID VALVE MV Area (PHT): 3.16 cm     TR Peak grad:   28.9 mmHg MV Decel Time: 240 msec     TR Vmax:        269.00 cm/s MV E velocity: 158.00 cm/s                             SHUNTS                             Systemic VTI:  0.23 m                             Systemic Diam: 1.70 cm Lorine Bears MD Electronically signed by Lorine Bears MD Signature Date/Time: 07/27/2019/8:13:34 AM    Final     Assessment & Plan    86 y.o.femalewith history ofheart failure with preserved ejection fraction, chronic atrial fibrillation, coronary artery disease, dementia who is followed by [facility] who was admitted  with weakness and acute onset of confusion worse over her baseline.She had a cardiac MRI in 2011 showing normal RV and LV function. There was a left atrial thrombus with no evidence of myocardial infarction. She had a non-STEMI in 2006 with cardiac cath revealing distal LAD disease not amenable to PCI. She had a PCI of the RCA with a Taxus stent. This was done in 2006 earlier. She has been on rate control with diltiazem for her A. fib. She has been on Eliquis for anticoagulation. She was diagnosed with both mixed Alzheimer's and vascular dementia in 2015. Her most recent outpatient regimen included apixaban 5 mg twice daily, atorvastatin 80 mg daily, carvedilol 6.25 mg twice daily, diltiazem 240 mg daily, furosemide 80 mg twice daily, spironolactone 25 mg daily along with potassium chloride to 20 mEq daily. She was last seen by her [facility] cardiology team approximately 1 year ago.  1. HFpEF-EF has been well-preserved by echo and cardiac MRI at [facility] with normal RV and LV function. Chest x-ray did not show pulmonary edema on admission. Fluid status appears to be over hydration. She does not appear acutely in congestive heart failure at present. Heart rate is controlled and she is oxygenating well. Echo done during this admission was read as showing an EF of 40 to 45% with moderate MR and AI.    2. Atrial fibrillation-rate appears well controlled at present. We will continue with diltiazem . Continue to hold eliquis due to anemia and possible active bleeding.   3. Sepsis-continue with current rx.   4. Dementia-has mixed vascular and Alzheimer's dementia. Per chart, appears to be getting near her baseline.  5. Coronary artery disease-status post PCI. This was done in 2006. No clinical evidence of ischemia. Patient denies chest pain at present. Not a candidate for invasive evaluation at present. Cardiac markers are unremarkable.  6. Hemoglobin is dropped which may be secondary  to hydration.I would resume eliquis 5 mg bid  Patient appears nearing baseline. Will evaluate the morning but consideration for discharge if improvement continues. Outpatient follow-upwith [employer], [facility].   Signed, Darlin Priestly Shantoria Ellwood MD 07/27/2019,  3:27 PM  Pager: [phone]

## 2019-07-27 NOTE — Progress Notes (Signed)
PROGRESS NOTE    Kimberly Wang  WUJ:[id number] DOB: 1930/04/24 DOA: 07/22/2019 PCP: [name], MD   Brief Narrative:  HPI per Dr. Valente David on 07/22/2019 Kimberly Wang  is a 86 y.o. female with a known history of coronary artery disease, hypertension and dementia, who presented to the emergency room with acute onset of altered mental status with generalized weakness and confusion and significant diminish p.o. intake especially today.  She vomited once.  She was having low-grade fever without chills.  She denied any abdominal pain.  No significant dysuria, hematuria or urgency or frequency or flank pain.  No cough or wheezing or dyspnea.  Upon presentation to the emergency room, temperature was 100.2 and heart rate 101 with otherwise normal vital signs.  CMP revealed hyponatremia and hypokalemia as well as hypochloremia with blood glucose of 223, BUN of 18 and creatinine 1.14, albumin of 3.4.  Lactic acid was 3.8 and later 3.7.  High-sensitivity troponin I was 60.  CBC showed leukocytosis of 25.1 with neutrophilia.  Urinalysis showed 6-10 WBCs with positive nitrite and trace leukocytes.  Blood cultures were drawn and urine culture was sent.  Abdominal and pelvic CT scan revealed paraesophageal hernia with no acute findings.  Chest x-ray showed bilateral small pleural effusions with mild cardiomegaly.  The patient was given 1 g of p.o. Tylenol, a gram of IV cefepime and a gram of IV vancomycin, 1 L of lactated Ringer, 10 medical IV potassium chloride and 40 mEq p.o. as well as 1 L IV normal saline.  She will be admitted to the progressive unit bed for further evaluation and management.  **Interim History Remains pleasantly demented but will need PT OT to further evaluate and treat and now they are recommending SNF. Antibiotics have been changed to p.o.  She was wheezing a little bit her IV fluids have been stopped and she is given a dose of IV Lasix the day before yesterday and again yesterday  and will be given another one today. Cardiology was consulted for concern of CHF and Volume overload. She remains in Atrial Fibrillation but has had some faster heart rates.  Because of concern for GI bleed as her hemoglobin has dropped significantly along with an elevated BUN gastroenterology was consulted and they feel that the patient needs further hematological work-up to determine etiology of her anemia before proceeding with any endoscopic procedure.  Patient is unsure about having endoscopic procedure.  Fecal occult test has been pending.  Her Eliquis has been held for now.  Will need to continue monitor for signs and symptoms of bleeding.  Cardiology she still is not in overt heart failure recommending continuing to carefully diurese and following renal function and symptoms given that she is still 6-1/2 L positive  Assessment & Plan:   Active Problems:   Coagulation defect (HCC)   Sepsis due to gram-negative UTI (HCC)   Severe sepsis (HCC)   Dementia due to Parkinson's disease without behavioral disturbance (HCC)   Benign essential HTN   CAD (coronary artery disease)  Severe sepsis/ positive E. coli UTI -HR> 90, WBC> 12, lactic acid> 2 -Complete 5-day course of antibiotics -Stopped  Normal saline 57ml/hr given her Wheezing and Dyspnea -Give 1x of IV Lasix yesterday and she has breathing treatments and given another dose today  -Repeat chest x-ray this AM showed "Cardiomegaly and mild bilateral interstitial prominence and small pleural effusions" -6/2 change to oral antibiotics to po Keflex -PT/OT to further evaluate and Treat; OT done recommending  home health OT still need PT evaluation if family cannot look after her 24/7 then they recommend SNF for increased safety and functional strengthening before returning home -WBC was improving and went from 14.8 -> 11.9 but is now continue to trend upwards and is now 16.6; She does have a Chronic Leukocytosis at baseline; otology feels that  leukocytosis most likely secondary to her UTI and Dr. Cathie HoopsYu discussed with the pathologist Dr. Burgess Estellenley who looked at her smear and there is no blasts or fragmented RBCs.  There is reactive local erythroblastic changes blood loss versus hemolysis -Patient procalcitonin level has been intermittently elevated and ranging from 0.68 and is trended down to 0.49 is now 0.54 -lactic acid level was 2.1 and went up to 4.1 is now down to 2.8  -repeat lactic acid level this AM was 1.1 -[facility] and if she will go home she can likely go home in the next 24 to 48 hours versus SNF; PT/OT recommending SNF  AcuteMetabolic Encephalopathy superimposed on chronic dementia -Multifactorial to include severe sepsis, hyponatremia.  Son was able to give accurate baseline a few days ago to my colleague  -C/w Neurochecks q 4 hrs -Aricept 10 mg daily -C/w Memantine 10 mg daily and continue with quetiapine 25 mg p.o. nightly as needed for agitation and sleep -Continue with delirium precautions -Minimize sedating medication  A. fib with RVR -Rates have been intermittently high and cardiology recommending continuing diltiazem and following -they recommending continue anticoagulation with Eliquis and continue telemetry monitoring -Eliquis currently held due to concern for GI bleed  Acute on chronic diastolic CHF -Mild in the setting of volume overload -BNP was elevated 125 next-chest x-ray did show a CHF pattern picture -Given IV Lasix yesterday and this morning-cardiology does not feel that she is in acute heart failure -She is + 6.583 L since admission and weight is up 6 pounds  -She was given IV Lasix 20 mg again today and cardiology recommends continuing to carefully diurese follow renal function and symptoms  Hyponatremia -Multifactorial volume depletion, SSRI, anorexia. -Correct underlying factors -IVF now stopped and will be given a dose of IV Lasix 20 mg for third day enteral -Zoloft 50 mg  daily (hold) -Megace 400 mg daily  Elevated troponin high-sensitivity/demand ischemia She has a history of CAD status post PCI done in 2006 -Troponin was 60 and repeat was 64 -Not consistent with ACS.  Most consistent with demand ischemia. -Not complaining of any Chest Pain -Continue with a apixaban 5 mg p.o. twice daily, atorvastatin 80 mg p.o. daily, and nitroglycerin 0.4 mg sublingual every 5 minutes as needed chest pain -Currently does not have any chest pain cardiac markers are unremarkable  Essential HTN -C/w Cardizem 240 mg daily  Dyslipidemia -C/w Lipitor 80 mg daily -Lipid panel done showed a total cholesterol/HDL ratio of 4.2, cholesterol of 172, HDL level of 17, LDL 38, triglycerides of 86 and VLDL of 17  Coagulation defect -Currently holding Apixaban 5 mg BID as there is concern for GIB  Elevated BUN -Patient is BUN is now 34 and was 50.  -check FOBT to rule out Upper GI bleeding as her hemoglobin has been slowly dropping further -Currently not on steroids -continue to monitor carefully and repeat CMP in a.m.  Normocytic Anemia -Patient hemoglobin/hematocrit has been slowly dropping and went from 10.6/31.8 down to 9.2/28.3 and has further declined to 7.8/24.2 -Checked anemia panel and showed iron level of 30, U IBC of 233, TIBC of  263, saturation ratio is 11%, ferritin level 55, folate level 14.8, vitamin B12 level 440 -Check FOBT to rule out GI bleeding and this is still pending -Anticoagulated with apixaban but will currently hold -continue monitor for signs and symptoms bleeding; currently no overt bleeding noted -Consulted gastroenterology for anemia work-up and they are deferring the endoscopic procedures for now and want a hematological work-up -Hematology Dr. Cathie Hoops has been consulted and he feels that the iron panel is not typical for iron deficiency.  He has recommended obtaining a reticulocyte panel and obtain LDH and haptoglobin.  Dr. Increased reticulocytosis can  be seen in acute GI bleeding or hemolysis with patient's T bili is normal less likely hemolysis but we are still checking LDH and haptoglobin -Appreciate Heme-Onc as well as GI evaluation -Repeat CBC i with differential in a.m. and also continue daily  Hypokalemia -Goal of potassium level> 4 -Potassium today is 4.3  -continue to monitor and replete as necessary -Repeat CMP in a.m.  Hypomagnesemia -Mag Level was 2.1 today -Continue to monitor replete as necessary -Repeat CBC in a.m.  Hypophosphatemia -Goal> 2.5 -Patient's phosphorus is now 2.9 -Continue to monitor and treat as necessary  DVT prophylaxis: SCDs; Anticoagulated with Apxiaban but have held it for now given her continued drop in Hgb Code Status: FULL CODE Family Communication: No family present at bedside  Disposition Plan: Further evaluation by PT OT for safe discharge disposition and they are recommending SNF.  Her antibiotics have been now changed to p.o. cardiology is consulted given her concern for volume overload and CHF  Status is: Inpatient  Remains inpatient appropriate because:Unsafe d/c plan and Inpatient level of care appropriate due to severity of illness   Dispo: The patient is from: Home              Anticipated d/c is to: TBD; SNF vs. Home Health PT/OT              Anticipated d/c date is: 1 day              Patient currently is not medically stable to d/c.  Consultants:   Cardiology  Gastroenterology  Hematology    Procedures: None  Antimicrobials:  Anti-infectives (From admission, onward)   Start     Dose/Rate Route Frequency Ordered Stop   07/26/19 0600  cephALEXin (KEFLEX) capsule 500 mg     500 mg Oral Every 6 hours 07/25/19 1718 07/29/19 0559   07/22/19 0900  cefTRIAXone (ROCEPHIN) 1 g in sodium chloride 0.9 % 100 mL IVPB  Status:  Discontinued     1 g 200 mL/hr over 30 Minutes Intravenous Every 24 hours 07/22/19 0248 07/25/19 1714   07/22/19 0145  vancomycin (VANCOCIN) IVPB 1000  mg/200 mL premix     1,000 mg 200 mL/hr over 60 Minutes Intravenous  Once 07/22/19 0144 07/22/19 0349   07/22/19 0045  ceFEPIme (MAXIPIME) 1 g in sodium chloride 0.9 % 100 mL IVPB     1 g 200 mL/hr over 30 Minutes Intravenous  Once 07/22/19 0039 07/22/19 0202     Subjective: Seen and examined at bedside she was sitting in the chair and had no complaints.  States that she is doing little bit better.  No nausea or vomiting.  States that she is have bowel movements but does not know the color of them.  No other concerns or complaints at this time.  Objective: Vitals:   07/27/19 0459 07/27/19 0747 07/27/19 1130 07/27/19 1622  BP:  114/71 133/75 126/70  Pulse:  83 81 70  Resp:  20 18 18   Temp:  97.8 F (36.6 C) 97.8 F (36.6 C) (!) 97.1 F (36.2 C)  TempSrc:   Oral   SpO2:  100% 99% 99%  Weight: 75.3 kg     Height:        Intake/Output Summary (Last 24 hours) at 07/27/2019 1800 Last data filed at 07/27/2019 1300 Gross per 24 hour  Intake 550 ml  Output 900 ml  Net -350 ml   Filed Weights   07/23/19 0500 07/25/19 0406 07/27/19 0459  Weight: 72.7 kg 74.5 kg 75.3 kg   Examination: Physical Exam:  Constitutional: WN/WD overweight elderly pleasantly demented Caucasian female currently no acute distress sitting in the chair bedside Eyes: Lids and conjunctivae normal, sclerae anicteric  ENMT: External Ears, Nose appear normal. Grossly normal hearing.  Neck: Appears normal, supple, no cervical masses, normal ROM, no appreciable thyromegaly: No appreciable JVD Respiratory: Diminished to auscultation bilaterally with coarse breath sounds and some mild crackles but she has unlabored breathing Cardiovascular: Irregularly irregular and not as tachycardic, has a 2 out of 6 systolic murmur.  S1 and S2 auscultated.  Trace extremity edema. Abdomen: Soft, non-tender, distended secondary body habitus. Bowel sounds positive.   GU: Deferred. Musculoskeletal: No clubbing / cyanosis of digits/nails.  No joint deformity upper and lower extremities but she has painful palpation on lower extremities Skin: No rashes, lesions, ulcers or limited skin evaluation. No induration; Warm and dry.  Neurologic: CN 2-12 grossly intact with no focal deficits. Romberg sign cerebellar reflexes not assessed.  Psychiatric: Impaired judgment and insight.  She is awake and alert and oriented x2.  Normal and pleasant mood and appropriate affect.   Data Reviewed: I have personally reviewed following labs and imaging studies  CBC: Recent Labs  Lab 07/23/19 0433 07/24/19 0528 07/25/19 0527 07/26/19 0443 07/27/19 0447  WBC 14.3* 14.8* 11.9* 15.6* 16.6*  NEUTROABS 12.7* 13.7* 9.8* 12.3* 13.0*  HGB 11.8* 10.6* 9.2* 8.5* 7.8*  HCT 36.0 31.8* 28.3* 25.8* 24.2*  MCV 92.5 91.9 93.1 94.2 96.4  PLT 143* 154 180 202 232   Basic Metabolic Panel: Recent Labs  Lab 07/23/19 0433 07/24/19 0528 07/25/19 0527 07/26/19 0443 07/27/19 0447  NA 135 136 142 138 137  K 4.7 4.7 4.5 4.8 4.3  CL 103 105 113* 107 105  CO2 24 22 22 25 24   GLUCOSE 119* 140* 121* 133* 134*  BUN 16 23 40* 50* 34*  CREATININE 0.63 0.64 0.75 0.66 0.80  CALCIUM 8.5* 8.6* 8.5* 8.5* 8.6*  MG 2.3 2.3 2.4 2.0 2.1  PHOS 1.7* 2.3* 3.3 2.7 2.9   GFR: Estimated Creatinine Clearance: 50.1 mL/min (by C-G formula based on SCr of 0.8 mg/dL). Liver Function Tests: Recent Labs  Lab 07/23/19 0433 07/24/19 0528 07/25/19 0527 07/26/19 0443 07/27/19 0447  AST 40 35 31 26 27   ALT 27 29 33 30 30  ALKPHOS 96 85 69 60 63  BILITOT 1.0 0.8 0.4 0.4 0.7  PROT 5.9* 5.6* 5.0* 4.9* 5.0*  ALBUMIN 2.8* 2.5* 2.3* 2.3* 2.3*   No results for input(s): LIPASE, AMYLASE in the last 168 hours. No results for input(s): AMMONIA in the last 168 hours. Coagulation Profile: Recent Labs  Lab 07/21/19 2208 07/22/19 0646 07/27/19 1531  INR 1.6* 1.5* 1.5*   Cardiac Enzymes: No results for input(s): CKTOTAL, CKMB, CKMBINDEX, TROPONINI in the last 168 hours. BNP  (last 3 results) No results for input(s): PROBNP  in the last 8760 hours. HbA1C: No results for input(s): HGBA1C in the last 72 hours. CBG: No results for input(s): GLUCAP in the last 168 hours. Lipid Profile: No results for input(s): CHOL, HDL, LDLCALC, TRIG, CHOLHDL, LDLDIRECT in the last 72 hours. Thyroid Function Tests: No results for input(s): TSH, T4TOTAL, FREET4, T3FREE, THYROIDAB in the last 72 hours. Anemia Panel: Recent Labs    07/27/19 0447 07/27/19 1707  VITAMINB12 440  --   FOLATE 14.8  --   FERRITIN 55  --   TIBC 263  --   IRON 30  --   RETICCTPCT 2.7 3.8*   Sepsis Labs: Recent Labs  Lab 07/22/19 1214 07/22/19 1214 07/22/19 1855 07/23/19 0433 07/23/19 1342 07/23/19 1638 07/24/19 0528 07/25/19 0527 07/26/19 0443  PROCALCITON 0.95  --   --  0.68  --   --  0.49 0.54  --   LATICACIDVEN 2.2*   < > 2.1*  --  4.1* 2.8*  --   --  1.0   < > = values in this interval not displayed.    Recent Results (from the past 240 hour(s))  Culture, blood (Routine x 2)     Status: None   Collection Time: 07/21/19 10:07 PM   Specimen: BLOOD  Result Value Ref Range Status   Specimen Description BLOOD LEFT ANTECUBITAL  Final   Special Requests   Final    BOTTLES DRAWN AEROBIC AND ANAEROBIC Blood Culture results may not be optimal due to an excessive volume of blood received in culture bottles   Culture   Final    NO GROWTH 5 DAYS Performed at [facility], [address] [place], [place] [address]    Report Status 07/26/2019 FINAL  Final  Culture, blood (Routine x 2)     Status: None   Collection Time: 07/21/19 10:51 PM   Specimen: BLOOD  Result Value Ref Range Status   Specimen Description BLOOD BLOOD RIGHT ARM  Final   Special Requests   Final    BOTTLES DRAWN AEROBIC AND ANAEROBIC Blood Culture adequate volume   Culture   Final    NO GROWTH 5 DAYS Performed at [facility], [address], [place], [place] [address]    Report Status 07/26/2019  FINAL  Final  SARS Coronavirus 2 by RT PCR (hospital order, performed in [facility] hospital lab) Nasopharyngeal Nasopharyngeal Swab     Status: None   Collection Time: 07/22/19 12:34 AM   Specimen: Nasopharyngeal Swab  Result Value Ref Range Status   SARS Coronavirus 2 NEGATIVE NEGATIVE Final    Comment: (NOTE) SARS-CoV-2 target nucleic acids are NOT DETECTED. The SARS-CoV-2 RNA is generally detectable in upper and lower respiratory specimens during the acute phase of infection. The lowest concentration of SARS-CoV-2 viral copies this assay can detect is 250 copies / mL. A negative result does not preclude SARS-CoV-2 infection and should not be used as the sole basis for treatment or other patient management decisions.  A negative result may occur with improper specimen collection / handling, submission of specimen other than nasopharyngeal swab, presence of viral mutation(s) within the areas targeted by this assay, and inadequate number of viral copies (<250 copies / mL). A negative result must be combined with clinical observations, patient history, and epidemiological information. Fact Sheet for Patients:   BoilerBrush.com.cy Fact Sheet for Healthcare Providers: https://pope.com/ This test is not yet approved or cleared  by the [place] FDA and has been authorized for detection and/or  diagnosis of SARS-CoV-2 by FDA under an Emergency Use Authorization (EUA).  This EUA will remain in effect (meaning this test can be used) for the duration of the COVID-19 declaration under Section 564(b)(1) of the Act, 21 U.S.C. section 360bbb-3(b)(1), unless the authorization is terminated or revoked sooner. Performed at [facility], [address] [place], [place] [address]   Urine Culture     Status: Abnormal   Collection Time: 07/22/19 12:54 AM   Specimen: Urine, Random  Result Value Ref Range Status   Specimen Description    Final    URINE, RANDOM Performed at [facility], [address] [place], [place] [address]    Special Requests   Final    NONE Performed at [facility], [address] [place], [place] [address]    Culture >=100,000 COLONIES/mL ESCHERICHIA COLI (A)  Final   Report Status 07/25/2019 FINAL  Final   Organism ID, Bacteria ESCHERICHIA COLI (A)  Final      Susceptibility   Escherichia coli - MIC*    AMPICILLIN >=32 RESISTANT Resistant     CEFAZOLIN <=4 SENSITIVE Sensitive     CEFTRIAXONE <=1 SENSITIVE Sensitive     CIPROFLOXACIN 0.5 SENSITIVE Sensitive     GENTAMICIN <=1 SENSITIVE Sensitive     IMIPENEM <=0.25 SENSITIVE Sensitive     NITROFURANTOIN <=16 SENSITIVE Sensitive     TRIMETH/SULFA >=320 RESISTANT Resistant     AMPICILLIN/SULBACTAM 4 SENSITIVE Sensitive     PIP/TAZO <=4 SENSITIVE Sensitive     * >=100,000 COLONIES/mL ESCHERICHIA COLI     RN Pressure Injury Documentation:     Estimated body mass index is 28.49 kg/m as calculated from the following:   Height as of this encounter: 5\' 4"  (1.626 m).   Weight as of this encounter: 75.3 kg.  Malnutrition Type:      Malnutrition Characteristics:      Nutrition Interventions:     Radiology Studies: DG Chest 1 View  Result Date: 07/27/2019 CLINICAL DATA:  Shortness of breath EXAM: CHEST  1 VIEW COMPARISON:  July 26, 2019 FINDINGS: There is unchanged mild cardiomegaly. There is aortic knob and descending intrathoracic aortic calcifications. There is slight interval improvement in the prominence of the central pulmonary vasculature. No acute osseous abnormality. IMPRESSION: Slight interval improvement in the pulmonary vascular congestion. Electronically Signed   By: July 28, 2019 M.D.   On: 07/27/2019 06:04   DG Chest 1 View  Result Date: 07/26/2019 CLINICAL DATA:  Shortness of breath. EXAM: CHEST  1 VIEW COMPARISON:  07/23/2019. FINDINGS: Mediastinum hilar structures normal. Cardiomegaly. Mild  bilateral interstitial prominence and small pleural effusions. CHF could present in this fashion. Sliding hiatal hernia cannot be excluded. IMPRESSION: Cardiomegaly. Mild bilateral interstitial prominence and small pleural effusions. CHF could present in this fashion. Electronically Signed   By: 07/25/2019  Register   On: 07/26/2019 07:52   ECHOCARDIOGRAM COMPLETE  Result Date: 07/27/2019    ECHOCARDIOGRAM REPORT   Patient Name:   BRIENNE LIGUORI Date of Exam: 07/26/2019 Medical Rec #:  09/25/2019   Height:       64.0 in Accession #:    [id number]  Weight:       164.2 lb Date of Birth:  Jan 25, 1931    BSA:          1.799 m Patient Age:    88 years    BP:           117/65 mmHg Patient Gender: F  HR:           124 bpm. Exam Location:  [facility] Procedure: 2D Echo, Cardiac Doppler and Color Doppler Indications:     Dyspnea 786.09 / R06.00  History:         Patient has no prior history of Echocardiogram examinations.                  CAD; Risk Factors:Hypertension. MI.  Sonographer:     Neysa Bonito Roar Referring Phys:  [id number] Kateri Mc LATIF [facility] Diagnosing Phys: Lorine Bears MD IMPRESSIONS  1. Left ventricular ejection fraction, by estimation, is 40 to 45%. The left ventricle has mildly decreased function. The left ventricle demonstrates regional wall motion abnormalities with possible anterior/anteroseptal hypokinesis. There is mild left ventricular hypertrophy. Left ventricular diastolic parameters are indeterminate.  2. Right ventricular systolic function is normal. The right ventricular size is normal. There is mildly elevated pulmonary artery systolic pressure.  3. Left atrial size was severely dilated.  4. Right atrial size was moderately dilated.  5. The mitral valve is abnormal. Moderate mitral valve regurgitation. No evidence of mitral stenosis.  6. The aortic valve is abnormal. Aortic valve regurgitation is moderate. Mild aortic valve sclerosis is present, with no evidence of aortic valve stenosis. FINDINGS  Left  Ventricle: Left ventricular ejection fraction, by estimation, is 40 to 45%. The left ventricle has mildly decreased function. The left ventricle demonstrates regional wall motion abnormalities. The left ventricular internal cavity size was normal in size. There is mild left ventricular hypertrophy. Left ventricular diastolic parameters are indeterminate. Right Ventricle: The right ventricular size is normal. No increase in right ventricular wall thickness. Right ventricular systolic function is normal. There is mildly elevated pulmonary artery systolic pressure. The tricuspid regurgitant velocity is 2.69  m/s, and with an assumed right atrial pressure of 10 mmHg, the estimated right ventricular systolic pressure is 38.9 mmHg. Left Atrium: Left atrial size was severely dilated. Right Atrium: Right atrial size was moderately dilated. Pericardium: There is no evidence of pericardial effusion. Mitral Valve: The mitral valve is abnormal. There is moderate thickening of the mitral valve leaflet(s). There is mild calcification of the mitral valve leaflet(s). Normal mobility of the mitral valve leaflets. Moderate mitral annular calcification. Moderate mitral valve regurgitation. No evidence of mitral valve stenosis. Tricuspid Valve: The tricuspid valve is normal in structure. Tricuspid valve regurgitation is not demonstrated. No evidence of tricuspid stenosis. Aortic Valve: The aortic valve is abnormal. Aortic valve regurgitation is moderate. Mild aortic valve sclerosis is present, with no evidence of aortic valve stenosis. Aortic valve mean gradient measures 7.5 mmHg. Aortic valve peak gradient measures 14.1 mmHg. Aortic valve area, by VTI measures 1.73 cm. Pulmonic Valve: The pulmonic valve was normal in structure. Pulmonic valve regurgitation is not visualized. No evidence of pulmonic stenosis. Aorta: The aortic root is normal in size and structure. Venous: The inferior vena cava was not well visualized. IAS/Shunts: No  atrial level shunt detected by color flow Doppler.  LEFT VENTRICLE PLAX 2D LVIDd:         4.47 cm  Diastology LVIDs:         3.12 cm  LV e' lateral:   6.64 cm/s LV PW:         1.14 cm  LV E/e' lateral: 23.8 LV IVS:        1.06 cm  LV e' medial:    8.81 cm/s LVOT diam:     1.70 cm  LV E/e' medial:  17.9 LV  SV:         52 LV SV Index:   29 LVOT Area:     2.27 cm  RIGHT VENTRICLE RV Mid diam:    2.29 cm RV S prime:     12.70 cm/s TAPSE (M-mode): 1.6 cm LEFT ATRIUM             Index       RIGHT ATRIUM           Index LA diam:        4.20 cm 2.33 cm/m  RA Area:     19.00 cm LA Vol (A2C):   92.9 ml 51.64 ml/m RA Volume:   44.60 ml  24.79 ml/m LA Vol (A4C):   82.8 ml 46.02 ml/m LA Biplane Vol: 91.4 ml 50.80 ml/m  AORTIC VALVE                    PULMONIC VALVE AV Area (Vmax):    1.54 cm     PV Vmax:        1.10 m/s AV Area (Vmean):   1.56 cm     PV Peak grad:   4.8 mmHg AV Area (VTI):     1.73 cm     RVOT Peak grad: 2 mmHg AV Vmax:           187.50 cm/s AV Vmean:          123.000 cm/s AV VTI:            0.301 m AV Peak Grad:      14.1 mmHg AV Mean Grad:      7.5 mmHg LVOT Vmax:         127.00 cm/s LVOT Vmean:        84.500 cm/s LVOT VTI:          0.229 m LVOT/AV VTI ratio: 0.76  AORTA Ao Root diam: 2.20 cm MITRAL VALVE                TRICUSPID VALVE MV Area (PHT): 3.16 cm     TR Peak grad:   28.9 mmHg MV Decel Time: 240 msec     TR Vmax:        269.00 cm/s MV E velocity: 158.00 cm/s                             SHUNTS                             Systemic VTI:  0.23 m                             Systemic Diam: 1.70 cm Kathlyn Sacramento MD Electronically signed by Kathlyn Sacramento MD Signature Date/Time: 07/27/2019/8:13:34 AM    Final     Scheduled Meds: . atorvastatin  80 mg Oral Daily  . cephALEXin  500 mg Oral Q6H  . diltiazem  240 mg Oral Daily  . donepezil  10 mg Oral QHS  . ferrous sulfate  325 mg Oral Q breakfast  . megestrol  400 mg Oral Daily  . memantine  10 mg Oral Daily  . multivitamin with minerals   1 tablet Oral Daily  . pantoprazole (PROTONIX) IV  40 mg Intravenous Q12H  . potassium chloride  40 mEq Oral Once  . potassium chloride SA  20 mEq Oral Daily   Continuous Infusions:   LOS: 5 days   Merlene Laughter, DO [facility] is on AMION  If 7PM-7AM, please contact night-coverage [email or web address]

## 2019-07-27 NOTE — Plan of Care (Signed)
  Problem: Activity: Goal: Risk for activity intolerance will decrease Outcome: Progressing  Tolerated up to bathroom without distress.

## 2019-07-27 NOTE — Consult Note (Signed)
Melodie Bouillon, MD [address], [place], [place], [address] [address], [place], [place], [address] Phone: [phone]  Fax: [phone]  Consultation  Referring Provider:     Dr. Marland Mcalpine Primary Care Physician:  [name], MD Reason for Consultation:    Anemia  Date of Admission:  07/22/2019 Date of Consultation:  07/27/2019         HPI:   Kimberly Wang is a 86 y.o. female with history of coronary artery disease, A. fib, on Eliquis, dementia, admitted 5 days ago with altered mental status, generalized weakness and confusion, decreased p.o. intake and GI being consulted now due to drop in hemoglobin without any active GI bleeding.  During my examination today, patient is sitting up in chair, eating solid food without any difficulty or distress.  Denies any abdominal pain, nausea or vomiting, altered bowel habits, melena, hematochezia, hematemesis, epistaxis, or any other sources of bleeding.  Patient is being treated for UTI on this admission and also being treated for volume overload.  Cardiology does not think it is from CHF, but rather overhydration.  Last Eliquis use was this morning.  Patient has an elevated white count today, at 16.6  Patient reports a remote history of colonoscopy.  In care everywhere, there is a 2004 colonoscopy done for iron deficiency anemia that showed diverticulosis, no source of iron deficiency was found.  She also had an upper endoscopy at the same time that showed normal esophagus, erythematous gastropathy, normal duodenum.  Past Medical History:  Diagnosis Date  . Coronary artery disease   . Dementia (HCC)   . Hypertension   . Myocardial infarct [facility])     Past Surgical History:  Procedure Laterality Date  . CHOLECYSTECTOMY    . CORONARY STENT PLACEMENT      Prior to Admission medications   Medication Sig Start Date End Date Taking? Authorizing Provider  atorvastatin (LIPITOR) 80 MG tablet Take by mouth.  11/21/18   [provider]  diltiazem (CARDIZEM CD) 240 MG 24 hr capsule  03/28/19   [provider]  donepezil (ARICEPT) 10 MG tablet  03/28/19   [provider]  ELIQUIS 5 MG TABS tablet  04/15/19   [provider]  FEROSUL 325 (65 Fe) MG tablet  03/28/19   [provider]  furosemide (LASIX) 80 MG tablet  03/28/19   [provider]  memantine (NAMENDA) 10 MG tablet  03/28/19   [provider]  Multiple Vitamin (MULTIVITAMIN) capsule Take by mouth.    [provider]  nitroGLYCERIN (NITROSTAT) 0.4 MG SL tablet Place under the tongue. 11/21/18   [provider]  potassium chloride SA (KLOR-CON) 20 MEQ tablet Take by mouth. 11/21/18   [provider]  sertraline (ZOLOFT) 50 MG tablet  04/15/19   [provider]  spironolactone (ALDACTONE) 25 MG tablet Take by mouth. 11/21/18   [provider]    History reviewed. No pertinent family history.   Social History   Tobacco Use  . Smoking status: Never Smoker  . Smokeless tobacco: Never Used  Substance Use Topics  . Alcohol use: Never  . Drug use: Never    Allergies as of 07/21/2019 - Review Complete 07/21/2019  Allergen Reaction Noted  . Latex Hives and Rash 05/17/2019  . Lidocaine hcl Other (See Comments) 05/17/2019  . Codeine Other (See Comments) 05/17/2019  . Morphine Other (See Comments) 05/17/2019  . Novocain [procaine]  05/17/2019  . Other Other (See  Comments) 05/17/2019    Review of Systems:    All systems reviewed and negative except where noted in HPI.   Physical Exam:  Vital signs in last 24 hours: Vitals:   07/27/19 0419 07/27/19 0459 07/27/19 0747 07/27/19 1130  BP: (!) 155/68  114/71 133/75  Pulse: 81  83 81  Resp: (!) 21  20 18   Temp: 97.7 F (36.5 C)  97.8 F (36.6 C) 97.8 F (36.6 C)  TempSrc: Oral   Oral  SpO2: 100%  100% 99%  Weight:  75.3 kg    Height:       Last BM Date: 07/25/19 General:   Pleasant,  cooperative in NAD Head:  Normocephalic and atraumatic. Eyes:   No icterus.   Conjunctiva pink. PERRLA. Ears:  Normal auditory acuity. Neck:  Supple; no masses or thyroidomegaly Lungs: Respirations even and unlabored. Lungs clear to auscultation bilaterally.   No wheezes, crackles, or rhonchi.  Abdomen:  Soft, nondistended, nontender. Normal bowel sounds. No appreciable masses or hepatomegaly.  No rebound or guarding.  Neurologic:  Alert and oriented x3;  grossly normal neurologically. Skin:  Intact without significant lesions or rashes. Cervical Nodes:  No significant cervical adenopathy. Psych:  Alert and cooperative. Normal affect.  LAB RESULTS: Recent Labs    07/25/19 0527 07/26/19 0443 07/27/19 0447  WBC 11.9* 15.6* 16.6*  HGB 9.2* 8.5* 7.8*  HCT 28.3* 25.8* 24.2*  PLT 180 202 232   BMET Recent Labs    07/25/19 0527 07/26/19 0443 07/27/19 0447  NA 142 138 137  K 4.5 4.8 4.3  CL 113* 107 105  CO2 22 25 24   GLUCOSE 121* 133* 134*  BUN 40* 50* 34*  CREATININE 0.75 0.66 0.80  CALCIUM 8.5* 8.5* 8.6*   LFT Recent Labs    07/27/19 0447  PROT 5.0*  ALBUMIN 2.3*  AST 27  ALT 30  ALKPHOS 63  BILITOT 0.7   PT/INR No results for input(s): LABPROT, INR in the last 72 hours.  STUDIES: DG Chest 1 View  Result Date: 07/27/2019 CLINICAL DATA:  Shortness of breath EXAM: CHEST  1 VIEW COMPARISON:  July 26, 2019 FINDINGS: There is unchanged mild cardiomegaly. There is aortic knob and descending intrathoracic aortic calcifications. There is slight interval improvement in the prominence of the central pulmonary vasculature. No acute osseous abnormality. IMPRESSION: Slight interval improvement in the pulmonary vascular congestion. Electronically Signed   By: 09/26/2019 M.D.   On: 07/27/2019 06:04   DG Chest 1 View  Result Date: 07/26/2019 CLINICAL DATA:  Shortness of breath. EXAM: CHEST  1 VIEW COMPARISON:  07/23/2019. FINDINGS: Mediastinum hilar structures normal.  Cardiomegaly. Mild bilateral interstitial prominence and small pleural effusions. CHF could present in this fashion. Sliding hiatal hernia cannot be excluded. IMPRESSION: Cardiomegaly. Mild bilateral interstitial prominence and small pleural effusions. CHF could present in this fashion. Electronically Signed   By: 09/25/2019  Register   On: 07/26/2019 07:52   ECHOCARDIOGRAM COMPLETE  Result Date: 07/27/2019    ECHOCARDIOGRAM REPORT   Patient Name:   BERTRICE LEDER Date of Exam: 07/26/2019 Medical Rec #:  Edd Arbour   Height:       64.0 in Accession #:    09/25/2019  Weight:       164.2 lb Date of Birth:  1930/10/10    BSA:          1.799 m Patient Age:    88 years    BP:  117/65 mmHg Patient Gender: F           HR:           124 bpm. Exam Location:  [facility] Procedure: 2D Echo, Cardiac Doppler and Color Doppler Indications:     Dyspnea 786.09 / R06.00  History:         Patient has no prior history of Echocardiogram examinations.                  CAD; Risk Factors:Hypertension. MI.  Sonographer:     Neysa Bonito Roar Referring Phys:  [id number] Kateri Mc LATIF [facility] Diagnosing Phys: Lorine Bears MD IMPRESSIONS  1. Left ventricular ejection fraction, by estimation, is 40 to 45%. The left ventricle has mildly decreased function. The left ventricle demonstrates regional wall motion abnormalities with possible anterior/anteroseptal hypokinesis. There is mild left ventricular hypertrophy. Left ventricular diastolic parameters are indeterminate.  2. Right ventricular systolic function is normal. The right ventricular size is normal. There is mildly elevated pulmonary artery systolic pressure.  3. Left atrial size was severely dilated.  4. Right atrial size was moderately dilated.  5. The mitral valve is abnormal. Moderate mitral valve regurgitation. No evidence of mitral stenosis.  6. The aortic valve is abnormal. Aortic valve regurgitation is moderate. Mild aortic valve sclerosis is present, with no evidence of aortic valve stenosis.  FINDINGS  Left Ventricle: Left ventricular ejection fraction, by estimation, is 40 to 45%. The left ventricle has mildly decreased function. The left ventricle demonstrates regional wall motion abnormalities. The left ventricular internal cavity size was normal in size. There is mild left ventricular hypertrophy. Left ventricular diastolic parameters are indeterminate. Right Ventricle: The right ventricular size is normal. No increase in right ventricular wall thickness. Right ventricular systolic function is normal. There is mildly elevated pulmonary artery systolic pressure. The tricuspid regurgitant velocity is 2.69  m/s, and with an assumed right atrial pressure of 10 mmHg, the estimated right ventricular systolic pressure is 38.9 mmHg. Left Atrium: Left atrial size was severely dilated. Right Atrium: Right atrial size was moderately dilated. Pericardium: There is no evidence of pericardial effusion. Mitral Valve: The mitral valve is abnormal. There is moderate thickening of the mitral valve leaflet(s). There is mild calcification of the mitral valve leaflet(s). Normal mobility of the mitral valve leaflets. Moderate mitral annular calcification. Moderate mitral valve regurgitation. No evidence of mitral valve stenosis. Tricuspid Valve: The tricuspid valve is normal in structure. Tricuspid valve regurgitation is not demonstrated. No evidence of tricuspid stenosis. Aortic Valve: The aortic valve is abnormal. Aortic valve regurgitation is moderate. Mild aortic valve sclerosis is present, with no evidence of aortic valve stenosis. Aortic valve mean gradient measures 7.5 mmHg. Aortic valve peak gradient measures 14.1 mmHg. Aortic valve area, by VTI measures 1.73 cm. Pulmonic Valve: The pulmonic valve was normal in structure. Pulmonic valve regurgitation is not visualized. No evidence of pulmonic stenosis. Aorta: The aortic root is normal in size and structure. Venous: The inferior vena cava was not well visualized.  IAS/Shunts: No atrial level shunt detected by color flow Doppler.  LEFT VENTRICLE PLAX 2D LVIDd:         4.47 cm  Diastology LVIDs:         3.12 cm  LV e' lateral:   6.64 cm/s LV PW:         1.14 cm  LV E/e' lateral: 23.8 LV IVS:        1.06 cm  LV e' medial:    8.81 cm/s  LVOT diam:     1.70 cm  LV E/e' medial:  17.9 LV SV:         52 LV SV Index:   29 LVOT Area:     2.27 cm  RIGHT VENTRICLE RV Mid diam:    2.29 cm RV S prime:     12.70 cm/s TAPSE (M-mode): 1.6 cm LEFT ATRIUM             Index       RIGHT ATRIUM           Index LA diam:        4.20 cm 2.33 cm/m  RA Area:     19.00 cm LA Vol (A2C):   92.9 ml 51.64 ml/m RA Volume:   44.60 ml  24.79 ml/m LA Vol (A4C):   82.8 ml 46.02 ml/m LA Biplane Vol: 91.4 ml 50.80 ml/m  AORTIC VALVE                    PULMONIC VALVE AV Area (Vmax):    1.54 cm     PV Vmax:        1.10 m/s AV Area (Vmean):   1.56 cm     PV Peak grad:   4.8 mmHg AV Area (VTI):     1.73 cm     RVOT Peak grad: 2 mmHg AV Vmax:           187.50 cm/s AV Vmean:          123.000 cm/s AV VTI:            0.301 m AV Peak Grad:      14.1 mmHg AV Mean Grad:      7.5 mmHg LVOT Vmax:         127.00 cm/s LVOT Vmean:        84.500 cm/s LVOT VTI:          0.229 m LVOT/AV VTI ratio: 0.76  AORTA Ao Root diam: 2.20 cm MITRAL VALVE                TRICUSPID VALVE MV Area (PHT): 3.16 cm     TR Peak grad:   28.9 mmHg MV Decel Time: 240 msec     TR Vmax:        269.00 cm/s MV E velocity: 158.00 cm/s                             SHUNTS                             Systemic VTI:  0.23 m                             Systemic Diam: 1.70 cm Lorine BearsMuhammad Arida MD Electronically signed by Lorine BearsMuhammad Arida MD Signature Date/Time: 07/27/2019/8:13:34 AM    Final       Impression / Plan:   Edd ArbourDiane Absher is a 86 y.o. y/o female with history of dementia, A. fib, on Eliquis, last use this morning, admitted with generalized weakness, UTI and GI being consulted for anemia without active GI bleeding  Normocytic anemia with normal  ferritin and iron  Without evidence of iron deficiency, and elevated white count with ongoing treatment for UTI, would recommend further evaluation with hematology to determine etiology of anemia before proceeding with  any endoscopic procedures  I did discuss with the patient if she would want endoscopic procedures if they are determined to be necessary.  Patient states she will think about it.  I specifically asked her if there are any questions I can help answer to help with her decision.  She simply stated she would like to think about it before making a decision.  Stool occult test has been ordered by primary team and is pending  Eliquis will need to be held for 2 to 3 days prior to any elective procedures.  In the meantime please obtain hematology work-up to evaluate source of anemia  If patient agreeable to proceeding with procedures after hematology work-up is complete, can schedule accordingly  Dr. Vicente Males to cover from tomorrow  Thank you for involving me in the care of this patient.      LOS: 5 days   Virgel Manifold, MD  07/27/2019, 1:10 PM

## 2019-07-27 NOTE — Care Management Important Message (Signed)
Important Message  Patient Details  Name: Kimberly Wang MRN: [id number] Date of Birth: [birth year]   Medicare Important Message Given:  Yes     [name] 07/27/2019, 2:44 PM

## 2019-07-28 DIAGNOSIS — N390 Urinary tract infection, site not specified: Secondary | ICD-10-CM | POA: Insufficient documentation

## 2019-07-28 LAB — CBC WITH DIFFERENTIAL/PLATELET
Abs Immature Granulocytes: 0.54 10*3/uL — ABNORMAL HIGH (ref 0.00–0.07)
Basophils Absolute: 0.1 10*3/uL (ref 0.0–0.1)
Basophils Relative: 0 %
Eosinophils Absolute: 0.2 10*3/uL (ref 0.0–0.5)
Eosinophils Relative: 1 %
HCT: 23.4 % — ABNORMAL LOW (ref 36.0–46.0)
Hemoglobin: 7.4 g/dL — ABNORMAL LOW (ref 12.0–15.0)
Immature Granulocytes: 3 %
Lymphocytes Relative: 13 %
Lymphs Abs: 2.5 10*3/uL (ref 0.7–4.0)
MCH: 31.1 pg (ref 26.0–34.0)
MCHC: 31.6 g/dL (ref 30.0–36.0)
MCV: 98.3 fL (ref 80.0–100.0)
Monocytes Absolute: 0.8 10*3/uL (ref 0.1–1.0)
Monocytes Relative: 4 %
Neutro Abs: 14.4 10*3/uL — ABNORMAL HIGH (ref 1.7–7.7)
Neutrophils Relative %: 79 %
Platelets: 269 10*3/uL (ref 150–400)
RBC: 2.38 MIL/uL — ABNORMAL LOW (ref 3.87–5.11)
RDW: 14.6 % (ref 11.5–15.5)
WBC: 18.5 10*3/uL — ABNORMAL HIGH (ref 4.0–10.5)
nRBC: 0.5 % — ABNORMAL HIGH (ref 0.0–0.2)

## 2019-07-28 LAB — COMPREHENSIVE METABOLIC PANEL
ALT: 42 U/L (ref 0–44)
AST: 36 U/L (ref 15–41)
Albumin: 2.4 g/dL — ABNORMAL LOW (ref 3.5–5.0)
Alkaline Phosphatase: 69 U/L (ref 38–126)
Anion gap: 5 (ref 5–15)
BUN: 23 mg/dL (ref 8–23)
CO2: 24 mmol/L (ref 22–32)
Calcium: 8.6 mg/dL — ABNORMAL LOW (ref 8.9–10.3)
Chloride: 108 mmol/L (ref 98–111)
Creatinine, Ser: 0.82 mg/dL (ref 0.44–1.00)
GFR calc Af Amer: >60 mL/min (ref >60)
GFR calc non Af Amer: >60 mL/min (ref >60)
Glucose, Bld: 118 mg/dL — ABNORMAL HIGH (ref 70–99)
Potassium: 4.6 mmol/L (ref 3.5–5.1)
Sodium: 137 mmol/L (ref 135–145)
Total Bilirubin: 0.5 mg/dL (ref 0.3–1.2)
Total Protein: 5.2 g/dL — ABNORMAL LOW (ref 6.5–8.1)

## 2019-07-28 LAB — MAGNESIUM: Magnesium: 2.1 mg/dL (ref 1.7–2.4)

## 2019-07-28 LAB — TYPE AND SCREEN
ABO/RH(D): AB POS
Antibody Screen: NEGATIVE

## 2019-07-28 LAB — PHOSPHORUS: Phosphorus: 3.3 mg/dL (ref 2.5–4.6)

## 2019-07-28 LAB — LACTATE DEHYDROGENASE: LDH: 186 U/L (ref 98–192)

## 2019-07-28 MED ORDER — APIXABAN 5 MG PO TABS
5.0000 mg | ORAL_TABLET | Freq: Two times a day (BID) | ORAL | Status: DC
  Administered 2019-07-28: 5 mg via ORAL
  Filled 2019-07-28: qty 1

## 2019-07-28 MED ORDER — FERROUS SULFATE 325 (65 FE) MG PO TABS
325.0000 mg | ORAL_TABLET | Freq: Two times a day (BID) | ORAL | Status: DC
  Administered 2019-07-28 – 2019-07-29 (×2): 325 mg via ORAL
  Filled 2019-07-28 (×2): qty 1

## 2019-07-28 NOTE — Progress Notes (Addendum)
PROGRESS NOTE    Kimberly Wang  JJK:[id number] DOB: [birth year] DOA: 07/22/2019 PCP: [name], MD   Brief Narrative:  HPI per Dr. Valente David on 07/22/2019 Kimberly Wang  is a 86 y.o. female with a known history of coronary artery disease, hypertension and dementia, who presented to the emergency room with acute onset of altered mental status with generalized weakness and confusion and significant diminish p.o. intake especially today.  She vomited once.  She was having low-grade fever without chills.  She denied any abdominal pain.  No significant dysuria, hematuria or urgency or frequency or flank pain.  No cough or wheezing or dyspnea.  Upon presentation to the emergency room, temperature was 100.2 and heart rate 101 with otherwise normal vital signs.  CMP revealed hyponatremia and hypokalemia as well as hypochloremia with blood glucose of 223, BUN of 18 and creatinine 1.14, albumin of 3.4.  Lactic acid was 3.8 and later 3.7.  High-sensitivity troponin I was 60.  CBC showed leukocytosis of 25.1 with neutrophilia.  Urinalysis showed 6-10 WBCs with positive nitrite and trace leukocytes.  Blood cultures were drawn and urine culture was sent.  Abdominal and pelvic CT scan revealed paraesophageal hernia with no acute findings.  Chest x-ray showed bilateral small pleural effusions with mild cardiomegaly.  The patient was given 1 g of p.o. Tylenol, a gram of IV cefepime and a gram of IV vancomycin, 1 L of lactated Ringer, 10 medical IV potassium chloride and 40 mEq p.o. as well as 1 L IV normal saline.  She will be admitted to the progressive unit bed for further evaluation and management.  **Interim History Remains pleasantly demented but will need PT OT to further evaluate and treat and now they are recommending SNF. Antibiotics have been changed to p.o.  She was wheezing a little bit her IV fluids have been stopped and she is given a dose of IV Lasix the day before yesterday and again yesterday  and will be given another one today. Cardiology was consulted for concern of CHF and Volume overload. She remains in Atrial Fibrillation but has had some faster heart rates.  Because of concern for GI bleed as her hemoglobin has dropped significantly along with an elevated BUN gastroenterology was consulted and they feel that the patient needs further hematological work-up to determine etiology of her anemia before proceeding with any endoscopic procedure.  Patient is unsure about having endoscopic procedure.  Fecal occult test has been pending.  Her Eliquis has been held for now.  Will need to continue monitor for signs and symptoms of bleeding.  Cardiology she still is not in overt heart failure recommending continuing to carefully diurese and following renal function and symptoms given that she is still 6.5 L positive.  Cardiology recommends discharging from their standpoint on current medications with Lipitor, diltiazem and K-Dur 20 milliequivalents and following up with Jonelle Sidle at [facility]  Hematology was consulted given her acute anemia and Dr. Cathie Hoops assisting and he recommends checking an MMA which is currently pending.  He is also recommended checking a haptoglobin which is also pending but he feels that hemolysis is less likely.  Because her hemoglobin continues to decrease further he recommends transfusion if it is below 7 and increasing will iron supplementation to twice daily.  He feels that we will need to rule out GI bleeding and cardiology recommending resuming Eliquis 5 mg twice daily as they do not feel that she is bleeding, however with her continuing drop in  hemoglobin I do not feel that this is secondary to hydration and will hold until we figure out the etiology of her blood count being lower   Assessment & Plan:   Active Problems:   Coagulation defect (HCC)   Sepsis due to gram-negative UTI (HCC)   Severe sepsis (HCC)   Dementia due to Parkinson's disease without behavioral disturbance  (HCC)   Benign essential HTN   CAD (coronary artery disease)   Anemia   Sepsis due to urinary tract infection (HCC)  Severe sepsis/ positive E. coli UTI -HR> 90, WBC> 12, lactic acid> 2 -Complete 5-day course of antibiotics -Stopped  Normal saline 22ml/hr given her Wheezing and Dyspnea -Give 1x of IV Lasix yesterday and she has breathing treatments and given another dose today  -Repeat chest x-ray this AM showed "Cardiomegaly and mild bilateral interstitial prominence and small pleural effusions" -6/2 change to oral antibiotics to po Keflex -PT/OT to further evaluate and Treat; OT done recommending home health OT still need PT evaluation if family cannot look after her 24/7 then they recommend SNF for increased safety and functional strengthening before returning home -WBC was improving and went from 14.8 -> 11.9 -> 16.6 continues to trend upwards and is now 18.5; She does have a Chronic Leukocytosis at baseline; otology feels that leukocytosis most likely secondary to her UTI and Dr. Cathie Hoops discussed with the pathologist Dr. Burgess Estelle who looked at her smear and there is no blasts or fragmented RBCs.  There is reactive Leukoerythroblastic changes blood loss versus hemolysis -Patient procalcitonin level has been intermittently elevated and ranging from 0.68 and is trended down to 0.49 is now 0.54 -lactic acid level was 2.1 and went up to 4.1 is now down to 2.8  -repeat lactic acid level this AM was 1.1 -We will discuss with the [facility] team and if she will go home she can likely go home in the next 24 to 48 hours versus SNF; PT/OT recommending SNF  AcuteMetabolic Encephalopathy superimposed on chronic dementia -Multifactorial to include severe sepsis, hyponatremia.  Son was able to give accurate baseline a few days ago to my colleague  -C/w Neurochecks q 4 hrs -Aricept 10 mg daily -C/w Memantine 10 mg daily and continue with quetiapine 25 mg p.o. nightly as needed for agitation and sleep -Continue with  delirium precautions -Minimize sedating medication  A. fib with RVR -Rates have been intermittently high and cardiology recommending continuing diltiazem and following -they recommending continue anticoagulation with Eliquis and continue telemetry monitoring -Eliquis had been held due to concern for GI bleed and hemoglobin continues to drop per cardiology resumed at this morning and I have now discontinued it again as GI is planning to scope the patient in 2 days now  Acute on Chronic Diastolic CHF -Mild in the setting of volume overload -BNP was elevated 125 next-chest x-ray did show a CHF pattern picture -Given IV Lasix yesterday and this morning-cardiology does not feel that she is in acute heart failure -She is + 6.313 L since admission and weight is up 6 pounds  -She was given IV Lasix 20 mg again yesterday and cardiology recommends no further diuresis and recommends following up with cardiology in the outpatient setting  Hyponatremia, this is improved -Multifactorial volume depletion, SSRI, anorexia. -Correct underlying factors -IVF now stopped and will be given a dose of IV Lasix 20 mg for the last 3 days but will hold today -Zoloft 50 mg daily (hold) -Megace 400 mg daily  Elevated troponin high-sensitivity/demand ischemia  She has a history of CAD status post PCI done in 2006 -Troponin was 60 and repeat was 5264 -Not consistent with ACS.  Most consistent with demand ischemia. -Not complaining of any Chest Pain -Continue with apixaban 5 mg p.o. twice daily once we know that she is not bleeding -Continue atorvastatin 80 mg p.o. daily, and nitroglycerin 0.4 mg sublingual every 5 minutes as needed chest pain -Currently does not have any chest pain cardiac markers are unremarkable  Essential HTN -C/w Cardizem 240 mg daily  Dyslipidemia -C/w Lipitor 80 mg daily -Lipid panel done showed a total cholesterol/HDL ratio of 4.2, cholesterol of 172, HDL level of 17, LDL 38, triglycerides  of 86 and VLDL of 17  Coagulation defect -Currently was holding Apixaban 5 mg BID as there is concern for GIB and will need to figure out if she is and then if she is not we will need to resume her apixaban when able cardiology had resumed her this morning and she got her dose.  Will discontinue it again given her continued drop in hemoglobin  Elevated BUN -Patient is BUN peaked at 50 and is now improved and is 23 -check FOBT to rule out Upper GI bleeding as her hemoglobin has been slowly dropping further and still has not been done -Currently not on steroids -continue to monitor carefully and repeat CMP in a.m.  Normocytic Anemia -Patient hemoglobin/hematocrit has been slowly dropping and went from 10.6/31.8 down to 9.2/28.3 and has further declined to 7.4/23.4 -Checked anemia panel and showed iron level of 30, U IBC of 233, TIBC of 263, saturation ratio is 11%, ferritin level 55, folate level 14.8, vitamin B12 level 440 -Check FOBT to rule out GI bleeding and this is still pending -Anticoagulated with apixaban but will currently hold for now and resume as soon as we can at the recommendations of cardiology -continue monitor for signs and symptoms bleeding; currently no overt bleeding noted -Consulted Gastroenterology for anemia work-up and they are deferring the endoscopic procedures for now and want a hematological work-up -Hematology Dr. Cathie HoopsYu has been consulted and he feels that the iron panel is not typical for iron deficiency.  He has recommended obtaining a reticulocyte panel and obtain LDH and haptoglobin.  LDH was normal.  Dr. Cathie HoopsYu and I discussed and he feels Increased reticulocytosis can be seen in acute GI bleeding or hemolysis with patient's T bili is normal less likely hemolysis but we are still checking LDH and haptoglobin (Pending) -Appreciate Heme-Onc as well as GI evaluation; GI planning on doing a colonoscopy however she had been off of anticoagulation for 2 days almost and then  cardiology resumed her Eliquis this morning and she received a dose and I have now discontinued it again -Repeat CBC with differential in a.m. and also continue daily  Hypokalemia -Goal of potassium level> 4 -Potassium today is 4.6  -continue to monitor and replete as necessary -Repeat CMP in a.m.  Hypomagnesemia -Mag Level was 2.1 today -Continue to monitor replete as necessary -Repeat CBC in a.m.  Hypophosphatemia -Goal> 2.5 -Patient's phosphorus is now 3.3 -Continue to monitor and treat as necessary  DVT prophylaxis: SCDs; Anticoagulated with Apxiaban but have held it for now given her continued drop in Hgb Code Status: FULL CODE Family Communication: No family present at bedside  Disposition Plan: Further evaluation by PT OT for safe discharge disposition and they are recommending SNF.  Her antibiotics have been now changed to p.o. cardiology is consulted given her concern for volume  overload and CHF; because there is concern of her having a drop in hemoglobin GI and hematology were both consulted  Status is: Inpatient  Remains inpatient appropriate because:Unsafe d/c plan and Inpatient level of care appropriate due to severity of illness   Dispo: The patient is from: Home              Anticipated d/c is to: TBD; SNF vs. Home Health PT/OT              Anticipated d/c date is: 1 day              Patient currently is not medically stable to d/c.  Consultants:   Cardiology  Gastroenterology  Hematology    Procedures: None  Antimicrobials:  Anti-infectives (From admission, onward)   Start     Dose/Rate Route Frequency Ordered Stop   07/26/19 0600  cephALEXin (KEFLEX) capsule 500 mg     500 mg Oral Every 6 hours 07/25/19 1718 07/29/19 0559   07/22/19 0900  cefTRIAXone (ROCEPHIN) 1 g in sodium chloride 0.9 % 100 mL IVPB  Status:  Discontinued     1 g 200 mL/hr over 30 Minutes Intravenous Every 24 hours 07/22/19 0248 07/25/19 1714   07/22/19 0145  vancomycin  (VANCOCIN) IVPB 1000 mg/200 mL premix     1,000 mg 200 mL/hr over 60 Minutes Intravenous  Once 07/22/19 0144 07/22/19 0349   07/22/19 0045  ceFEPIme (MAXIPIME) 1 g in sodium chloride 0.9 % 100 mL IVPB     1 g 200 mL/hr over 30 Minutes Intravenous  Once 07/22/19 0039 07/22/19 0202     Subjective: Seen and examined at bedside and she again was sitting in a chair and no complaints.  No lightheadedness or dizziness.  States that she is doing fairly well.  No other concerns or complaints at this time.   Objective: Vitals:   07/27/19 1622 07/27/19 1954 07/28/19 0452 07/28/19 1155  BP: 126/70 (!) 145/58 (!) 158/65 (!) 130/55  Pulse: 70 (!) 53 85 66  Resp: 18 16 19 19   Temp: (!) 97.1 F (36.2 C) 98.3 F (36.8 C) 98 F (36.7 C) (!) 97.5 F (36.4 C)  TempSrc:   Oral Oral  SpO2: 99% 100% 100% 99%  Weight:   76.4 kg   Height:        Intake/Output Summary (Last 24 hours) at 07/28/2019 1619 Last data filed at 07/28/2019 1350 Gross per 24 hour  Intake 480 ml  Output 750 ml  Net -270 ml   Filed Weights   07/25/19 0406 07/27/19 0459 07/28/19 0452  Weight: 74.5 kg 75.3 kg 76.4 kg   Examination: Physical Exam:  Constitutional: WN/WD overweight pleasantly demented Caucasian female currently in no acute distress sitting in the chair bedside, Eyes: Lids and conjunctivae normal, sclerae anicteric  ENMT: External Ears, Nose appear normal. Grossly normal hearing.  Neck: Appears normal, supple, no cervical masses, normal ROM, no appreciable thyromegaly; no JVD Respiratory: Diminished to auscultation bilaterally with coarse breath sounds and some slight crackles but this is improved., no wheezing, rales, rhonchi or crackles. Normal respiratory effort and patient is not tachypenic. No accessory muscle use.  She has unlabored breathing Cardiovascular: Irregularly irregular but not tachycardic, the 2 or 6 systolic murmur auscultated.  Minimal extremity edema. Abdomen: Soft, non-tender, distended  secondary to body habitus. Bowel sounds positive.  GU: Deferred. Musculoskeletal: No clubbing / cyanosis of digits/nails. No joint deformity upper and lower extremities.  Skin: No rashes, lesions, ulcers  on limited skin evaluation. No induration; Warm and dry.  Neurologic: CN 2-12 grossly intact with no focal deficits. Romberg sign and cerebellar reflexes not assessed.  Psychiatric: Impaired judgment and insight. Alert and oriented x 2. Normal mood and appropriate affect.   Data Reviewed: I have personally reviewed following labs and imaging studies  CBC: Recent Labs  Lab 07/24/19 0528 07/25/19 0527 07/26/19 0443 07/27/19 0447 07/28/19 0454  WBC 14.8* 11.9* 15.6* 16.6* 18.5*  NEUTROABS 13.7* 9.8* 12.3* 13.0* 14.4*  HGB 10.6* 9.2* 8.5* 7.8* 7.4*  HCT 31.8* 28.3* 25.8* 24.2* 23.4*  MCV 91.9 93.1 94.2 96.4 98.3  PLT 154 180 202 232 269   Basic Metabolic Panel: Recent Labs  Lab 07/24/19 0528 07/25/19 0527 07/26/19 0443 07/27/19 0447 07/28/19 0454  NA 136 142 138 137 137  K 4.7 4.5 4.8 4.3 4.6  CL 105 113* 107 105 108  CO2 GLUCOSE 140* 121* 133* 134* 118*  BUN 23 40* 50* 34* 23  CREATININE 0.64 0.75 0.66 0.80 0.82  CALCIUM 8.6* 8.5* 8.5* 8.6* 8.6*  MG 2.3 2.4 2.0 2.1 2.1  PHOS 2.3* 3.3 2.7 2.9 3.3   GFR: Estimated Creatinine Clearance: 49.3 mL/min (by C-G formula based on SCr of 0.82 mg/dL). Liver Function Tests: Recent Labs  Lab 07/24/19 0528 07/25/19 0527 07/26/19 0443 07/27/19 0447 07/28/19 0454  AST 35 36  ALT 29 33 30 30 42  ALKPHOS 85 69 60 63 69  BILITOT 0.8 0.4 0.4 0.7 0.5  PROT 5.6* 5.0* 4.9* 5.0* 5.2*  ALBUMIN 2.5* 2.3* 2.3* 2.3* 2.4*   No results for input(s): LIPASE, AMYLASE in the last 168 hours. No results for input(s): AMMONIA in the last 168 hours. Coagulation Profile: Recent Labs  Lab 07/21/19 2208 07/22/19 0646 07/27/19 1531  INR 1.6* 1.5* 1.5*   Cardiac Enzymes: No results for input(s): CKTOTAL, CKMB,  CKMBINDEX, TROPONINI in the last 168 hours. BNP (last 3 results) No results for input(s): PROBNP in the last 8760 hours. HbA1C: No results for input(s): HGBA1C in the last 72 hours. CBG: No results for input(s): GLUCAP in the last 168 hours. Lipid Profile: No results for input(s): CHOL, HDL, LDLCALC, TRIG, CHOLHDL, LDLDIRECT in the last 72 hours. Thyroid Function Tests: No results for input(s): TSH, T4TOTAL, FREET4, T3FREE, THYROIDAB in the last 72 hours. Anemia Panel: Recent Labs    07/27/19 0447 07/27/19 1707  VITAMINB12 440  --   FOLATE 14.8  --   FERRITIN 55  --   TIBC 263  --   IRON 30  --   RETICCTPCT 2.7 3.8*   Sepsis Labs: Recent Labs  Lab 07/22/19 1214 07/22/19 1214 07/22/19 1855 07/23/19 0433 07/23/19 1342 07/23/19 1638 07/24/19 0528 07/25/19 0527 07/26/19 0443  PROCALCITON 0.95  --   --  0.68  --   --  0.49 0.54  --   LATICACIDVEN 2.2*   < > 2.1*  --  4.1* 2.8*  --   --  1.0   < > = values in this interval not displayed.    Recent Results (from the past 240 hour(s))  Culture, blood (Routine x 2)     Status: None   Collection Time: 07/21/19 10:07 PM   Specimen: BLOOD  Result Value Ref Range Status   Specimen Description BLOOD LEFT ANTECUBITAL  Final   Special Requests   Final    BOTTLES DRAWN AEROBIC AND ANAEROBIC Blood Culture results may not be optimal due  to an excessive volume of blood received in culture bottles   Culture   Final    NO GROWTH 5 DAYS Performed at [facility], [address] [place], [place] [address]    Report Status 07/26/2019 FINAL  Final  Culture, blood (Routine x 2)     Status: None   Collection Time: 07/21/19 10:51 PM   Specimen: BLOOD  Result Value Ref Range Status   Specimen Description BLOOD BLOOD RIGHT ARM  Final   Special Requests   Final    BOTTLES DRAWN AEROBIC AND ANAEROBIC Blood Culture adequate volume   Culture   Final    NO GROWTH 5 DAYS Performed at [facility], [address],  [place], [place] [address]    Report Status 07/26/2019 FINAL  Final  SARS Coronavirus 2 by RT PCR (hospital order, performed in [facility] hospital lab) Nasopharyngeal Nasopharyngeal Swab     Status: None   Collection Time: 07/22/19 12:34 AM   Specimen: Nasopharyngeal Swab  Result Value Ref Range Status   SARS Coronavirus 2 NEGATIVE NEGATIVE Final    Comment: (NOTE) SARS-CoV-2 target nucleic acids are NOT DETECTED. The SARS-CoV-2 RNA is generally detectable in upper and lower respiratory specimens during the acute phase of infection. The lowest concentration of SARS-CoV-2 viral copies this assay can detect is 250 copies / mL. A negative result does not preclude SARS-CoV-2 infection and should not be used as the sole basis for treatment or other patient management decisions.  A negative result may occur with improper specimen collection / handling, submission of specimen other than nasopharyngeal swab, presence of viral mutation(s) within the areas targeted by this assay, and inadequate number of viral copies (<250 copies / mL). A negative result must be combined with clinical observations, patient history, and epidemiological information. Fact Sheet for Patients:   BoilerBrush.com.cy Fact Sheet for Healthcare Providers: https://pope.com/ This test is not yet approved or cleared  by the [place] FDA and has been authorized for detection and/or diagnosis of SARS-CoV-2 by FDA under an Emergency Use Authorization (EUA).  This EUA will remain in effect (meaning this test can be used) for the duration of the COVID-19 declaration under Section 564(b)(1) of the Act, 21 U.S.C. section 360bbb-3(b)(1), unless the authorization is terminated or revoked sooner. Performed at [facility], [address], [place], [place] [address]   Urine Culture     Status: Abnormal   Collection Time: 07/22/19 12:54 AM   Specimen: Urine, Random  Result  Value Ref Range Status   Specimen Description   Final    URINE, RANDOM Performed at [facility], [address] [place], [place] [address]    Special Requests   Final    NONE Performed at [facility], [address] [place], [place] [address]    Culture >=100,000 COLONIES/mL ESCHERICHIA COLI (A)  Final   Report Status 07/25/2019 FINAL  Final   Organism ID, Bacteria ESCHERICHIA COLI (A)  Final      Susceptibility   Escherichia coli - MIC*    AMPICILLIN >=32 RESISTANT Resistant     CEFAZOLIN <=4 SENSITIVE Sensitive     CEFTRIAXONE <=1 SENSITIVE Sensitive     CIPROFLOXACIN 0.5 SENSITIVE Sensitive     GENTAMICIN <=1 SENSITIVE Sensitive     IMIPENEM <=0.25 SENSITIVE Sensitive     NITROFURANTOIN <=16 SENSITIVE Sensitive     TRIMETH/SULFA >=320 RESISTANT Resistant     AMPICILLIN/SULBACTAM 4 SENSITIVE Sensitive     PIP/TAZO <=  4 SENSITIVE Sensitive     * >=100,000 COLONIES/mL ESCHERICHIA COLI     RN Pressure Injury Documentation:     Estimated body mass index is 28.92 kg/m as calculated from the following:   Height as of this encounter:  (1.626 m).   Weight as of this encounter: 76.4 kg.  Malnutrition Type:      Malnutrition Characteristics:      Nutrition Interventions:     Radiology Studies: DG Chest 1 View  Result Date: 07/27/2019 CLINICAL DATA:  Shortness of breath EXAM: CHEST  1 VIEW COMPARISON:  July 26, 2019 FINDINGS: There is unchanged mild cardiomegaly. There is aortic knob and descending intrathoracic aortic calcifications. There is slight interval improvement in the prominence of the central pulmonary vasculature. No acute osseous abnormality. IMPRESSION: Slight interval improvement in the pulmonary vascular congestion. Electronically Signed   By: Jonna Clark M.D.   On: 07/27/2019 06:04   ECHOCARDIOGRAM COMPLETE  Result Date: 07/27/2019    ECHOCARDIOGRAM REPORT   Patient Name:   INGER WIEST Date of Exam: 07/26/2019 Medical Rec #:   [id number]   Height:       64.0 in Accession #:    [id number]  Weight:       164.2 lb Date of Birth:  1930/05/10    BSA:          1.799 m Patient Age:    88 years    BP:           117/65 mmHg Patient Gender: F           HR:           124 bpm. Exam Location:  [facility] Procedure: 2D Echo, Cardiac Doppler and Color Doppler Indications:     Dyspnea 786.09 / R06.00  History:         Patient has no prior history of Echocardiogram examinations.                  CAD; Risk Factors:Hypertension. MI.  Sonographer:     Neysa Bonito Roar Referring Phys:  [id number] Kateri Mc LATIF [facility] Diagnosing Phys: Lorine Bears MD IMPRESSIONS  1. Left ventricular ejection fraction, by estimation, is 40 to 45%. The left ventricle has mildly decreased function. The left ventricle demonstrates regional wall motion abnormalities with possible anterior/anteroseptal hypokinesis. There is mild left ventricular hypertrophy. Left ventricular diastolic parameters are indeterminate.  2. Right ventricular systolic function is normal. The right ventricular size is normal. There is mildly elevated pulmonary artery systolic pressure.  3. Left atrial size was severely dilated.  4. Right atrial size was moderately dilated.  5. The mitral valve is abnormal. Moderate mitral valve regurgitation. No evidence of mitral stenosis.  6. The aortic valve is abnormal. Aortic valve regurgitation is moderate. Mild aortic valve sclerosis is present, with no evidence of aortic valve stenosis. FINDINGS  Left Ventricle: Left ventricular ejection fraction, by estimation, is 40 to 45%. The left ventricle has mildly decreased function. The left ventricle demonstrates regional wall motion abnormalities. The left ventricular internal cavity size was normal in size. There is mild left ventricular hypertrophy. Left ventricular diastolic parameters are indeterminate. Right Ventricle: The right ventricular size is normal. No increase in right ventricular wall thickness. Right ventricular systolic  function is normal. There is mildly elevated pulmonary artery systolic pressure. The tricuspid regurgitant velocity is 2.69  m/s, and with an assumed right atrial pressure of 10 mmHg, the estimated right ventricular systolic pressure is 38.9 mmHg. Left Atrium: Left atrial  size was severely dilated. Right Atrium: Right atrial size was moderately dilated. Pericardium: There is no evidence of pericardial effusion. Mitral Valve: The mitral valve is abnormal. There is moderate thickening of the mitral valve leaflet(s). There is mild calcification of the mitral valve leaflet(s). Normal mobility of the mitral valve leaflets. Moderate mitral annular calcification. Moderate mitral valve regurgitation. No evidence of mitral valve stenosis. Tricuspid Valve: The tricuspid valve is normal in structure. Tricuspid valve regurgitation is not demonstrated. No evidence of tricuspid stenosis. Aortic Valve: The aortic valve is abnormal. Aortic valve regurgitation is moderate. Mild aortic valve sclerosis is present, with no evidence of aortic valve stenosis. Aortic valve mean gradient measures 7.5 mmHg. Aortic valve peak gradient measures 14.1 mmHg. Aortic valve area, by VTI measures 1.73 cm. Pulmonic Valve: The pulmonic valve was normal in structure. Pulmonic valve regurgitation is not visualized. No evidence of pulmonic stenosis. Aorta: The aortic root is normal in size and structure. Venous: The inferior vena cava was not well visualized. IAS/Shunts: No atrial level shunt detected by color flow Doppler.  LEFT VENTRICLE PLAX 2D LVIDd:         4.47 cm  Diastology LVIDs:         3.12 cm  LV e' lateral:   6.64 cm/s LV PW:         1.14 cm  LV E/e' lateral: 23.8 LV IVS:        1.06 cm  LV e' medial:    8.81 cm/s LVOT diam:     1.70 cm  LV E/e' medial:  17.9 LV SV:         52 LV SV Index:   29 LVOT Area:     2.27 cm  RIGHT VENTRICLE RV Mid diam:    2.29 cm RV S prime:     12.70 cm/s TAPSE (M-mode): 1.6 cm LEFT ATRIUM             Index        RIGHT ATRIUM           Index LA diam:        4.20 cm 2.33 cm/m  RA Area:     19.00 cm LA Vol (A2C):   92.9 ml 51.64 ml/m RA Volume:   44.60 ml  24.79 ml/m LA Vol (A4C):   82.8 ml 46.02 ml/m LA Biplane Vol: 91.4 ml 50.80 ml/m  AORTIC VALVE                    PULMONIC VALVE AV Area (Vmax):    1.54 cm     PV Vmax:        1.10 m/s AV Area (Vmean):   1.56 cm     PV Peak grad:   4.8 mmHg AV Area (VTI):     1.73 cm     RVOT Peak grad: 2 mmHg AV Vmax:           187.50 cm/s AV Vmean:          123.000 cm/s AV VTI:            0.301 m AV Peak Grad:      14.1 mmHg AV Mean Grad:      7.5 mmHg LVOT Vmax:         127.00 cm/s LVOT Vmean:        84.500 cm/s LVOT VTI:          0.229 m LVOT/AV VTI ratio: 0.76  AORTA Ao Root diam:  2.20 cm MITRAL VALVE                TRICUSPID VALVE MV Area (PHT): 3.16 cm     TR Peak grad:   28.9 mmHg MV Decel Time: 240 msec     TR Vmax:        269.00 cm/s MV E velocity: 158.00 cm/s                             SHUNTS                             Systemic VTI:  0.23 m                             Systemic Diam: 1.70 cm Lorine Bears MD Electronically signed by Lorine Bears MD Signature Date/Time: 07/27/2019/8:13:34 AM    Final     Scheduled Meds: . apixaban  5 mg Oral BID  . atorvastatin  80 mg Oral Daily  . cephALEXin  500 mg Oral Q6H  . diltiazem  240 mg Oral Daily  . donepezil  10 mg Oral QHS  . ferrous sulfate  325 mg Oral BID WC  . furosemide  20 mg Intravenous Once  . megestrol  400 mg Oral Daily  . memantine  10 mg Oral Daily  . multivitamin with minerals  1 tablet Oral Daily  . pantoprazole (PROTONIX) IV  40 mg Intravenous Q12H  . potassium chloride  40 mEq Oral Once  . potassium chloride SA  20 mEq Oral Daily   Continuous Infusions:   LOS: 6 days   Merlene Laughter, DO [facility] PAGER is on AMION  If 7PM-7AM, please contact night-coverage [email or web address]

## 2019-07-28 NOTE — Plan of Care (Signed)
  Problem: Education: Goal: Knowledge of General Education information will improve Description: Including pain rating scale, medication(s)/side effects and non-pharmacologic comfort measures Outcome: Not Progressing   Problem: Health Behavior/Discharge Planning: Goal: Ability to manage health-related needs will improve Outcome: Not Progressing   Problem: Clinical Measurements: Goal: Will remain free from infection Outcome: Progressing   

## 2019-07-28 NOTE — Progress Notes (Addendum)
Patient Name: Kimberly Wang Date of Encounter: 07/28/2019  Hospital Problem List     Active Problems:   Coagulation defect ([place])   Sepsis due to gram-negative UTI ([place])   Severe sepsis ([place])   Dementia due to Parkinson's disease without behavioral disturbance ([place])   Benign essential HTN   CAD (coronary artery disease)   Anemia    Patient Profile     86 year old female with history of HFpEF followed at [facility], history of coronary artery disease status post PCI approximately 15 years ago, history atrial fibrillation rate controlled and anticoagulated with Eliquis.  Rate control with carvedilol and diltiazem admitted with UTI sepsis.  She recieved aggressive fluid resuscitation  resulting in evidence of volume overload.    Subjective   No complaints today  Inpatient Medications    . atorvastatin  80 mg Oral Daily  . cephALEXin  500 mg Oral Q6H  . diltiazem  240 mg Oral Daily  . donepezil  10 mg Oral QHS  . ferrous sulfate  325 mg Oral Q breakfast  . furosemide  20 mg Intravenous Once  . megestrol  400 mg Oral Daily  . memantine  10 mg Oral Daily  . multivitamin with minerals  1 tablet Oral Daily  . pantoprazole (PROTONIX) IV  40 mg Intravenous Q12H  . potassium chloride  40 mEq Oral Once  . potassium chloride SA  20 mEq Oral Daily    Vital Signs    Vitals:   07/27/19 1130 07/27/19 1622 07/27/19 1954 07/28/19 0452  BP: 133/75 126/70 (!) 145/58 (!) 158/65  Pulse: 81 70 (!) 53 85  Resp: 18 18 16 19   Temp: 97.8 F (36.6 C) (!) 97.1 F (36.2 C) 98.3 F (36.8 C) 98 F (36.7 C)  TempSrc: Oral   Oral  SpO2: 99% 99% 100% 100%  Weight:    76.4 kg  Height:        Intake/Output Summary (Last 24 hours) at 07/28/2019 0900 Last data filed at 07/28/2019 0432 Gross per 24 hour  Intake --  Output 950 ml  Net -950 ml   Filed Weights   07/25/19 0406 07/27/19 0459 07/28/19 0452  Weight: 74.5 kg 75.3 kg 76.4 kg    Physical Exam    GEN: Well  nourished, well developed, in no acute distress.  HEENT: normal.  Neck: Supple, no JVD, carotid bruits, or masses. Cardiac: irr, irr Respiratory:  Respirations regular and unlabored, clear to auscultation bilaterally. GI: Soft, nontender, nondistended, BS + x 4. MS: no deformity or atrophy. Skin: warm and dry, no rash. Neuro:  Pleasantly confused  Labs    CBC Recent Labs    07/27/19 0447 07/28/19 0454  WBC 16.6* 18.5*  NEUTROABS 13.0* 14.4*  HGB 7.8* 7.4*  HCT 24.2* 23.4*  MCV 96.4 98.3  PLT 232 063   Basic Metabolic Panel Recent Labs    07/27/19 0447 07/28/19 0454  NA 137 137  K 4.3 4.6  CL 105 108  CO2 24 24  GLUCOSE 134* 118*  BUN 34* 23  CREATININE 0.80 0.82  CALCIUM 8.6* 8.6*  MG 2.1 2.1  PHOS 2.9 3.3   Liver Function Tests Recent Labs    07/27/19 0447 07/28/19 0454  AST 27 36  ALT 30 42  ALKPHOS 63 69  BILITOT 0.7 0.5  PROT 5.0* 5.2*  ALBUMIN 2.3* 2.4*   No results for input(s): LIPASE, AMYLASE in the last 72 hours. Cardiac Enzymes No results for input(s): CKTOTAL,  CKMB, CKMBINDEX, TROPONINI in the last 72 hours. BNP Recent Labs    07/26/19 0443 07/27/19 0545  BNP 125.1* 92.6   D-Dimer No results for input(s): DDIMER in the last 72 hours. Hemoglobin A1C No results for input(s): HGBA1C in the last 72 hours. Fasting Lipid Panel No results for input(s): CHOL, HDL, LDLCALC, TRIG, CHOLHDL, LDLDIRECT in the last 72 hours. Thyroid Function Tests No results for input(s): TSH, T4TOTAL, T3FREE, THYROIDAB in the last 72 hours.  Invalid input(s): FREET3  Telemetry    afib with controlled vr  ECG    afib with rvr   Radiology    CT ABDOMEN PELVIS WO CONTRAST  Result Date: 07/22/2019 CLINICAL DATA:  Sepsis EXAM: CT ABDOMEN AND PELVIS WITHOUT CONTRAST TECHNIQUE: Multidetector CT imaging of the abdomen and pelvis was performed following the standard protocol without IV contrast. COMPARISON:  None. FINDINGS: Lower chest: There is mild  cardiomegaly. Mitral valve calcifications. A moderate paraesophageal hernia containing contrast is seen. Hepatobiliary: Although limited due to the lack of intravenous contrast, normal in appearance without gross focal abnormality. The patient is status post cholecystectomy. No biliary ductal dilation. Pancreas:  Unremarkable.  No surrounding inflammatory changes. Spleen: Normal in size. Although limited due to the lack of intravenous contrast, normal in appearance. Adrenals/Urinary Tract: Both adrenal glands appear normal. The kidneys and collecting system appear normal without evidence of urinary tract calculus or hydronephrosis. Bladder is unremarkable. Stomach/Bowel: The stomach, small bowel, are normal in appearance. There is scattered colonic diverticula without diverticulitis. Vascular/Lymphatic: There are no enlarged abdominal or pelvic lymph nodes. Scattered aortic atherosclerotic calcifications are seen without aneurysmal dilatation. Reproductive: The uterus and adnexa are unremarkable. Other: A small fat and transverse colon containing umbilical hernia seen within the mid abdomen. Musculoskeletal: No acute or significant osseous findings. IMPRESSION: Moderate paraesophageal hernia. Diverticulosis without diverticulitis. No other acute intra-abdominal or pelvic pathology to explain the patient's symptoms. Aortic Atherosclerosis (ICD10-I70.0). Electronically Signed   By: Jonna ClarkBindu  Avutu M.D.   On: 07/22/2019 01:40   DG Chest 1 View  Result Date: 07/27/2019 CLINICAL DATA:  Shortness of breath EXAM: CHEST  1 VIEW COMPARISON:  July 26, 2019 FINDINGS: There is unchanged mild cardiomegaly. There is aortic knob and descending intrathoracic aortic calcifications. There is slight interval improvement in the prominence of the central pulmonary vasculature. No acute osseous abnormality. IMPRESSION: Slight interval improvement in the pulmonary vascular congestion. Electronically Signed   By: Jonna ClarkBindu  Avutu M.D.   On:  07/27/2019 06:04   DG Chest 1 View  Result Date: 07/26/2019 CLINICAL DATA:  Shortness of breath. EXAM: CHEST  1 VIEW COMPARISON:  07/23/2019. FINDINGS: Mediastinum hilar structures normal. Cardiomegaly. Mild bilateral interstitial prominence and small pleural effusions. CHF could present in this fashion. Sliding hiatal hernia cannot be excluded. IMPRESSION: Cardiomegaly. Mild bilateral interstitial prominence and small pleural effusions. CHF could present in this fashion. Electronically Signed   By: Maisie Fushomas  Register   On: 07/26/2019 07:52   DG Chest 1 View  Result Date: 07/23/2019 CLINICAL DATA:  Dyspnea EXAM: CHEST  1 VIEW COMPARISON:  Chest radiograph from one day prior. FINDINGS: Stable cardiomediastinal silhouette with mild cardiomegaly. No pneumothorax. Stable small left pleural effusion. No right pleural effusion. No overt pulmonary edema. Streaky bibasilar lung opacities are similar IMPRESSION: Stable small left pleural effusion. Stable streaky bibasilar lung opacities, favor atelectasis or scarring. Stable mild cardiomegaly without overt pulmonary edema. Electronically Signed   By: Delbert PhenixJason A Poff M.D.   On: 07/23/2019 04:50   DG Chest  1 View  Result Date: 07/21/2019 CLINICAL DATA:  Sepsis. EXAM: CHEST  1 VIEW COMPARISON:  None. FINDINGS: There are small bilateral pleural effusions. There is a left perihilar density of unknown clinical significance. The heart size is mildly enlarged. There are dense aortic calcifications. There are few streaky airspace opacities at the lung bases favored to represent areas of atelectasis. There is no pneumothorax. IMPRESSION: 1. Small bilateral pleural effusions. 2. Mild cardiomegaly. 3. Slightly prominent left perihilar region may be secondary to a dilated pulmonary artery or lymphadenopathy. A follow-up two-view chest x-ray is recommended in 4-6 weeks. Electronically Signed   By: Katherine Mantle M.D.   On: 07/21/2019 22:34   DG Chest Port 1 View  Result  Date: 07/22/2019 CLINICAL DATA:  Shortness of breath. EXAM: PORTABLE CHEST 1 VIEW COMPARISON:  Jul 21, 2019 FINDINGS: Cardiomegaly. Skin fold over the lateral right chest. The hila and mediastinum are normal. No pneumothorax. No nodules or masses. No focal infiltrates. IMPRESSION: No active disease. Electronically Signed   By: Gerome Sam III M.D   On: 07/22/2019 15:12   ECHOCARDIOGRAM COMPLETE  Result Date: 07/27/2019    ECHOCARDIOGRAM REPORT   Patient Name:   Kimberly Wang Date of Exam: 07/26/2019 Medical Rec #:  [id number]   Height:       64.0 in Accession #:    [id number]  Weight:       164.2 lb Date of Birth:  1930/05/24    BSA:          1.799 m Patient Age:    88 years    BP:           117/65 mmHg Patient Gender: F           HR:           124 bpm. Exam Location:  [facility] Procedure: 2D Echo, Cardiac Doppler and Color Doppler Indications:     Dyspnea 786.09 / R06.00  History:         Patient has no prior history of Echocardiogram examinations.                  CAD; Risk Factors:Hypertension. MI.  Sonographer:     Neysa Bonito Roar Referring Phys:  [id number] Kateri Mc LATIF [facility] Diagnosing Phys: Lorine Bears MD IMPRESSIONS  1. Left ventricular ejection fraction, by estimation, is 40 to 45%. The left ventricle has mildly decreased function. The left ventricle demonstrates regional wall motion abnormalities with possible anterior/anteroseptal hypokinesis. There is mild left ventricular hypertrophy. Left ventricular diastolic parameters are indeterminate.  2. Right ventricular systolic function is normal. The right ventricular size is normal. There is mildly elevated pulmonary artery systolic pressure.  3. Left atrial size was severely dilated.  4. Right atrial size was moderately dilated.  5. The mitral valve is abnormal. Moderate mitral valve regurgitation. No evidence of mitral stenosis.  6. The aortic valve is abnormal. Aortic valve regurgitation is moderate. Mild aortic valve sclerosis is present, with no evidence of  aortic valve stenosis. FINDINGS  Left Ventricle: Left ventricular ejection fraction, by estimation, is 40 to 45%. The left ventricle has mildly decreased function. The left ventricle demonstrates regional wall motion abnormalities. The left ventricular internal cavity size was normal in size. There is mild left ventricular hypertrophy. Left ventricular diastolic parameters are indeterminate. Right Ventricle: The right ventricular size is normal. No increase in right ventricular wall thickness. Right ventricular systolic function is normal. There is mildly elevated pulmonary artery systolic pressure. The tricuspid regurgitant velocity  is 2.69  m/s, and with an assumed right atrial pressure of 10 mmHg, the estimated right ventricular systolic pressure is 38.9 mmHg. Left Atrium: Left atrial size was severely dilated. Right Atrium: Right atrial size was moderately dilated. Pericardium: There is no evidence of pericardial effusion. Mitral Valve: The mitral valve is abnormal. There is moderate thickening of the mitral valve leaflet(s). There is mild calcification of the mitral valve leaflet(s). Normal mobility of the mitral valve leaflets. Moderate mitral annular calcification. Moderate mitral valve regurgitation. No evidence of mitral valve stenosis. Tricuspid Valve: The tricuspid valve is normal in structure. Tricuspid valve regurgitation is not demonstrated. No evidence of tricuspid stenosis. Aortic Valve: The aortic valve is abnormal. Aortic valve regurgitation is moderate. Mild aortic valve sclerosis is present, with no evidence of aortic valve stenosis. Aortic valve mean gradient measures 7.5 mmHg. Aortic valve peak gradient measures 14.1 mmHg. Aortic valve area, by VTI measures 1.73 cm. Pulmonic Valve: The pulmonic valve was normal in structure. Pulmonic valve regurgitation is not visualized. No evidence of pulmonic stenosis. Aorta: The aortic root is normal in size and structure. Venous: The inferior vena cava  was not well visualized. IAS/Shunts: No atrial level shunt detected by color flow Doppler.  LEFT VENTRICLE PLAX 2D LVIDd:         4.47 cm  Diastology LVIDs:         3.12 cm  LV e' lateral:   6.64 cm/s LV PW:         1.14 cm  LV E/e' lateral: 23.8 LV IVS:        1.06 cm  LV e' medial:    8.81 cm/s LVOT diam:     1.70 cm  LV E/e' medial:  17.9 LV SV:         52 LV SV Index:   29 LVOT Area:     2.27 cm  RIGHT VENTRICLE RV Mid diam:    2.29 cm RV S prime:     12.70 cm/s TAPSE (M-mode): 1.6 cm LEFT ATRIUM             Index       RIGHT ATRIUM           Index LA diam:        4.20 cm 2.33 cm/m  RA Area:     19.00 cm LA Vol (A2C):   92.9 ml 51.64 ml/m RA Volume:   44.60 ml  24.79 ml/m LA Vol (A4C):   82.8 ml 46.02 ml/m LA Biplane Vol: 91.4 ml 50.80 ml/m  AORTIC VALVE                    PULMONIC VALVE AV Area (Vmax):    1.54 cm     PV Vmax:        1.10 m/s AV Area (Vmean):   1.56 cm     PV Peak grad:   4.8 mmHg AV Area (VTI):     1.73 cm     RVOT Peak grad: 2 mmHg AV Vmax:           187.50 cm/s AV Vmean:          123.000 cm/s AV VTI:            0.301 m AV Peak Grad:      14.1 mmHg AV Mean Grad:      7.5 mmHg LVOT Vmax:         127.00 cm/s LVOT Vmean:  84.500 cm/s LVOT VTI:          0.229 m LVOT/AV VTI ratio: 0.76  AORTA Ao Root diam: 2.20 cm MITRAL VALVE                TRICUSPID VALVE MV Area (PHT): 3.16 cm     TR Peak grad:   28.9 mmHg MV Decel Time: 240 msec     TR Vmax:        269.00 cm/s MV E velocity: 158.00 cm/s                             SHUNTS                             Systemic VTI:  0.23 m                             Systemic Diam: 1.70 cm Lorine Bears MD Electronically signed by Lorine Bears MD Signature Date/Time: 07/27/2019/8:13:34 AM    Final     Assessment & Plan    86 y.o.femalewith history ofheart failure with preserved ejection fraction, chronic atrial fibrillation, coronary artery disease, dementia who is followed by Marzetta Merino [facility] who was admitted  with weakness and acute onset of confusion worse over her baseline.She had a cardiac MRI in 2011 showing normal RV and LV function. There was a left atrial thrombus with no evidence of myocardial infarction. She had a non-STEMI in 2006 with cardiac cath revealing distal LAD disease not amenable to PCI. She had a PCI of the RCA with a Taxus stent. This was done in 2006 earlier. She has been on rate control with diltiazem for her A. fib. She has been on Eliquis for anticoagulation. She was diagnosed with both mixed Alzheimer's and vascular dementia in 2015. Her most recent outpatient regimen included apixaban 5 mg twice daily, atorvastatin 80 mg daily, carvedilol 6.25 mg twice daily, diltiazem 240 mg daily, furosemide 80 mg twice daily, spironolactone 25 mg daily along with potassium chloride to 20 mEq daily. She was last seen by her [facility] cardiology team approximately 1 year ago.  1. HFpEF-EF has been well-preserved by echo and cardiac MRI at [facility] with normal RV and LV function. Chest x-ray did not show pulmonary edema on admission. Fluid status appears to be over hydration. She does not appear acutely in congestive heart failure at present. Heart rate is controlled and she is oxygenating well. Would continue to carefully diurese following renal function and symptoms. BNP was mildly elevated.  Echo done during this admission was read as showing an EF of 40 to 45% with moderate MR and AI.  This does not appear critical.  Likely secondary to overhydration. She appears stable and at her baseline. OK for discharge from cardiac standpoint on current meds including. lipitor 80 mg dialy, diltiazem 240 mg daily and kdur 20 meq daily. Follow up with Jeralene Peters, [facility].    2. Atrial fibrillation-rate appears well controlled at present. We will continue with diltiazem . Resume eliquis at 5 mg bid  as she does not appear to be bleeding. .  3. Sepsis-continue with antibiotics and follow.  4.  Dementia-has mixed vascular and Alzheimer's dementia. Per chart, appears to be getting near her baseline.  5. Coronary artery disease-status post PCI. This was done in 2006. No clinical  evidence of ischemia. Patient denies chest pain at present. Not a candidate for invasive evaluation at present. Cardiac markers are unremarkable.  6.  Hemoglobin is dropped which may be secondary to hydration. I would resume eliquis 5 mg bid  Patient appears nearing baseline.  Will evaluate the morning but consideration for discharge if improvement continues.  Outpatient follow-up with Jeralene Peters, [facility].   Signed, Darlin Priestly Lugene Hitt MD 07/28/2019, 9:00 AM  Pager: [phone]

## 2019-07-28 NOTE — Progress Notes (Signed)
Hematology/Oncology Progress Note [facility] [phone] [phone]  Patient Care Team: [name], MD as PCP - General (Family Medicine)   Name of the patient: Kimberly Wang  [id number]  [birth year]  Date of visit: 07/28/19   INTERVAL HISTORY-  Patient was sitting in the chair.  She denies any complaints.    Review of systems- Review of Systems  Unable to perform ROS: Dementia    Allergies  Allergen Reactions  . Latex Hives and Rash  . Lidocaine Hcl Other (See Comments)    Other Reaction: Other reaction. Patient can't take Novacaine  . Codeine Other (See Comments)  . Morphine Other (See Comments)  . Novocain [Procaine]     Heart races  . Other Other (See Comments)    Uncoded Allergy. Allergen: RED PEPPER    Patient Active Problem List   Diagnosis Date Noted  . Anemia   . Sepsis due to gram-negative UTI (HCC) 07/22/2019  . Severe sepsis (HCC) 07/22/2019  . Dementia due to Parkinson's disease without behavioral disturbance (HCC) 07/22/2019  . Benign essential HTN 07/22/2019  . CAD (coronary artery disease) 07/22/2019  . Pain due to onychomycosis of toenails of both feet 05/17/2019  . Coagulation defect (HCC) 05/17/2019     Past Medical History:  Diagnosis Date  . Coronary artery disease   . Dementia (HCC)   . Hypertension   . Myocardial infarct [facility])      Past Surgical History:  Procedure Laterality Date  . CHOLECYSTECTOMY    . CORONARY STENT PLACEMENT      Social History   Socioeconomic History  . Marital status: Married    Spouse name: Not on file  . Number of children: Not on file  . Years of education: Not on file  . Highest education level: Not on file  Occupational History  . Not on file  Tobacco Use  . Smoking status: Never Smoker  . Smokeless tobacco: Never Used  Substance and Sexual Activity  . Alcohol use: Never  . Drug use: Never  . Sexual activity: Not on file  Other Topics  Concern  . Not on file  Social History Narrative  . Not on file   Social Determinants of Health   Financial Resource Strain:   . Difficulty of Paying Living Expenses:   Food Insecurity:   . Worried About Programme researcher, broadcasting/film/video in the Last Year:   . Barista in the Last Year:   Transportation Needs:   . Freight forwarder (Medical):   Marland Kitchen Lack of Transportation (Non-Medical):   Physical Activity:   . Days of Exercise per Week:   . Minutes of Exercise per Session:   Stress:   . Feeling of Stress :   Social Connections:   . Frequency of Communication with Friends and Family:   . Frequency of Social Gatherings with Friends and Family:   . Attends Religious Services:   . Active Member of Clubs or Organizations:   . Attends Banker Meetings:   Marland Kitchen Marital Status:   Intimate Partner Violence:   . Fear of Current or Ex-Partner:   . Emotionally Abused:   Marland Kitchen Physically Abused:   . Sexually Abused:      History reviewed. No pertinent family history.   Current Facility-Administered Medications:  .  acetaminophen (TYLENOL) tablet 650 mg, 650 mg, Oral, Q6H PRN, 650 mg at 07/25/19 2125 **OR** acetaminophen (TYLENOL) suppository 650 mg, 650 mg, Rectal, Q6H PRN,  Mansy, Jan A, MD .  apixaban Everlene Balls) tablet 5 mg, 5 mg, Oral, BID, Dalia Heading, MD, 5 mg at 07/28/19 0945 .  atorvastatin (LIPITOR) tablet 80 mg, 80 mg, Oral, Daily, Mansy, Jan A, MD, 80 mg at 07/28/19 0945 .  cephALEXin (KEFLEX) capsule 500 mg, 500 mg, Oral, Q6H, Sheikh, Omair Latif, DO, 500 mg at 07/28/19 1212 .  diltiazem (CARDIZEM CD) 24 hr capsule 240 mg, 240 mg, Oral, Daily, Mansy, Jan A, MD, 240 mg at 07/28/19 0953 .  donepezil (ARICEPT) tablet 10 mg, 10 mg, Oral, QHS, Mansy, Jan A, MD, 10 mg at 07/27/19 2226 .  ferrous sulfate tablet 325 mg, 325 mg, Oral, Q breakfast, Mansy, Jan A, MD, 325 mg at 07/28/19 0945 .  furosemide (LASIX) injection 20 mg, 20 mg, Intravenous, Once, Sheikh, Omair Latif, DO .   ipratropium-albuterol (DUONEB) 0.5-2.5 (3) MG/3ML nebulizer solution 3 mL, 3 mL, Nebulization, Q6H PRN, Jimmye Norman, NP, 3 mL at 07/23/19 1101 .  magnesium hydroxide (MILK OF MAGNESIA) suspension 30 mL, 30 mL, Oral, Daily PRN, Mansy, Jan A, MD .  megestrol (MEGACE) 400 MG/10ML suspension 400 mg, 400 mg, Oral, Daily, Drema Dallas, MD, 400 mg at 07/28/19 0945 .  memantine [facility]) tablet 10 mg, 10 mg, Oral, Daily, Mansy, Jan A, MD, 10 mg at 07/28/19 0945 .  multivitamin with minerals tablet 1 tablet, 1 tablet, Oral, Daily, Valrie Hart A, RPH, 1 tablet at 07/28/19 0945 .  nitroGLYCERIN (NITROSTAT) SL tablet 0.4 mg, 0.4 mg, Sublingual, Q5 min PRN, Mansy, Jan A, MD .  ondansetron (ZOFRAN) tablet 4 mg, 4 mg, Oral, Q6H PRN **OR** ondansetron (ZOFRAN) injection 4 mg, 4 mg, Intravenous, Q6H PRN, Mansy, Jan A, MD .  pantoprazole (PROTONIX) injection 40 mg, 40 mg, Intravenous, Q12H, Sheikh, Omair Latif, DO, 40 mg at 07/28/19 0945 .  potassium chloride (KLOR-CON) packet 40 mEq, 40 mEq, Oral, Once, Mansy, Jan A, MD .  potassium chloride SA (KLOR-CON) CR tablet 20 mEq, 20 mEq, Oral, Daily, Mansy, Jan A, MD, 20 mEq at 07/28/19 0945 .  QUEtiapine (SEROQUEL) tablet 25 mg, 25 mg, Oral, QHS PRN, Jimmye Norman, NP, 25 mg at 07/27/19 2226 .  traZODone (DESYREL) tablet 25 mg, 25 mg, Oral, QHS PRN, Mansy, Jan A, MD, 25 mg at 07/27/19 2226   Physical exam:  Vitals:   07/27/19 1622 07/27/19 1954 07/28/19 0452 07/28/19 1155  BP: 126/70 (!) 145/58 (!) 158/65 (!) 130/55  Pulse: 70 (!) 53 85 66  Resp: 18 16 19 19   Temp: (!) 97.1 F (36.2 C) 98.3 F (36.8 C) 98 F (36.7 C) (!) 97.5 F (36.4 C)  TempSrc:   Oral Oral  SpO2: 99% 100% 100% 99%  Weight:   168 lb 8 oz (76.4 kg)   Height:       Physical Exam  Constitutional: No distress.  HENT:  Head: Normocephalic and atraumatic.  Mouth/Throat: No oropharyngeal exudate.  Eyes: Pupils are equal, round, and reactive to light. EOM are normal.  No scleral icterus.  Cardiovascular: Normal rate and regular rhythm.  No murmur heard. Pulmonary/Chest: Effort normal. No respiratory distress. She has no rales. She exhibits no tenderness.  Abdominal: Soft.  Musculoskeletal:        General: Normal range of motion.     Cervical back: Normal range of motion and neck supple.  Neurological: She is alert.  Skin: Skin is warm and dry. She is not diaphoretic. No erythema.  Psychiatric: Affect normal.  CMP Latest Ref Rng & Units 07/28/2019  Glucose 70 - 99 mg/dL 275(T)  BUN 8 - 23 mg/dL 23  Creatinine 7.00 - 1.74 mg/dL 9.44  Sodium 967 - 591 mmol/L 137  Potassium 3.5 - 5.1 mmol/L 4.6  Chloride 98 - 111 mmol/L 108  CO2 22 - 32 mmol/L 24  Calcium 8.9 - 10.3 mg/dL 6.3(W)  Total Protein 6.5 - 8.1 g/dL 5.2(L)  Total Bilirubin 0.3 - 1.2 mg/dL 0.5  Alkaline Phos 38 - 126 U/L 69  AST 15 - 41 U/L 36  ALT 0 - 44 U/L 42   CBC Latest Ref Rng & Units 07/28/2019  WBC 4.0 - 10.5 K/uL 18.5(H)  Hemoglobin 12.0 - 15.0 g/dL 7.4(L)  Hematocrit 36.0 - 46.0 % 23.4(L)  Platelets 150 - 400 K/uL 269    RADIOGRAPHIC STUDIES: I have personally reviewed the radiological images as listed and agreed with the findings in the report. CT ABDOMEN PELVIS WO CONTRAST  Result Date: 07/22/2019 CLINICAL DATA:  Sepsis EXAM: CT ABDOMEN AND PELVIS WITHOUT CONTRAST TECHNIQUE: Multidetector CT imaging of the abdomen and pelvis was performed following the standard protocol without IV contrast. COMPARISON:  None. FINDINGS: Lower chest: There is mild cardiomegaly. Mitral valve calcifications. A moderate paraesophageal hernia containing contrast is seen. Hepatobiliary: Although limited due to the lack of intravenous contrast, normal in appearance without gross focal abnormality. The patient is status post cholecystectomy. No biliary ductal dilation. Pancreas:  Unremarkable.  No surrounding inflammatory changes. Spleen: Normal in size. Although limited due to the lack of  intravenous contrast, normal in appearance. Adrenals/Urinary Tract: Both adrenal glands appear normal. The kidneys and collecting system appear normal without evidence of urinary tract calculus or hydronephrosis. Bladder is unremarkable. Stomach/Bowel: The stomach, small bowel, are normal in appearance. There is scattered colonic diverticula without diverticulitis. Vascular/Lymphatic: There are no enlarged abdominal or pelvic lymph nodes. Scattered aortic atherosclerotic calcifications are seen without aneurysmal dilatation. Reproductive: The uterus and adnexa are unremarkable. Other: A small fat and transverse colon containing umbilical hernia seen within the mid abdomen. Musculoskeletal: No acute or significant osseous findings. IMPRESSION: Moderate paraesophageal hernia. Diverticulosis without diverticulitis. No other acute intra-abdominal or pelvic pathology to explain the patient's symptoms. Aortic Atherosclerosis (ICD10-I70.0). Electronically Signed   By: Jonna Clark M.D.   On: 07/22/2019 01:40   DG Chest 1 View  Result Date: 07/27/2019 CLINICAL DATA:  Shortness of breath EXAM: CHEST  1 VIEW COMPARISON:  July 26, 2019 FINDINGS: There is unchanged mild cardiomegaly. There is aortic knob and descending intrathoracic aortic calcifications. There is slight interval improvement in the prominence of the central pulmonary vasculature. No acute osseous abnormality. IMPRESSION: Slight interval improvement in the pulmonary vascular congestion. Electronically Signed   By: Jonna Clark M.D.   On: 07/27/2019 06:04   DG Chest 1 View  Result Date: 07/26/2019 CLINICAL DATA:  Shortness of breath. EXAM: CHEST  1 VIEW COMPARISON:  07/23/2019. FINDINGS: Mediastinum hilar structures normal. Cardiomegaly. Mild bilateral interstitial prominence and small pleural effusions. CHF could present in this fashion. Sliding hiatal hernia cannot be excluded. IMPRESSION: Cardiomegaly. Mild bilateral interstitial prominence and small  pleural effusions. CHF could present in this fashion. Electronically Signed   By: Maisie Fus  Register   On: 07/26/2019 07:52   DG Chest 1 View  Result Date: 07/23/2019 CLINICAL DATA:  Dyspnea EXAM: CHEST  1 VIEW COMPARISON:  Chest radiograph from one day prior. FINDINGS: Stable cardiomediastinal silhouette with mild cardiomegaly. No pneumothorax. Stable small left pleural effusion. No right  pleural effusion. No overt pulmonary edema. Streaky bibasilar lung opacities are similar IMPRESSION: Stable small left pleural effusion. Stable streaky bibasilar lung opacities, favor atelectasis or scarring. Stable mild cardiomegaly without overt pulmonary edema. Electronically Signed   By: Ilona Sorrel M.D.   On: 07/23/2019 04:50   DG Chest 1 View  Result Date: 07/21/2019 CLINICAL DATA:  Sepsis. EXAM: CHEST  1 VIEW COMPARISON:  None. FINDINGS: There are small bilateral pleural effusions. There is a left perihilar density of unknown clinical significance. The heart size is mildly enlarged. There are dense aortic calcifications. There are few streaky airspace opacities at the lung bases favored to represent areas of atelectasis. There is no pneumothorax. IMPRESSION: 1. Small bilateral pleural effusions. 2. Mild cardiomegaly. 3. Slightly prominent left perihilar region may be secondary to a dilated pulmonary artery or lymphadenopathy. A follow-up two-view chest x-ray is recommended in 4-6 weeks. Electronically Signed   By: Constance Holster M.D.   On: 07/21/2019 22:34   DG Chest Port 1 View  Result Date: 07/22/2019 CLINICAL DATA:  Shortness of breath. EXAM: PORTABLE CHEST 1 VIEW COMPARISON:  Jul 21, 2019 FINDINGS: Cardiomegaly. Skin fold over the lateral right chest. The hila and mediastinum are normal. No pneumothorax. No nodules or masses. No focal infiltrates. IMPRESSION: No active disease. Electronically Signed   By: Dorise Bullion III M.D   On: 07/22/2019 15:12   ECHOCARDIOGRAM COMPLETE  Result Date: 07/27/2019     ECHOCARDIOGRAM REPORT   Patient Name:   MASIAH LEWING Date of Exam: 07/26/2019 Medical Rec #:  [id number]   Height:       64.0 in Accession #:    [id number]  Weight:       164.2 lb Date of Birth:  12/15/1930    BSA:          1.799 m Patient Age:    38 years    BP:           117/65 mmHg Patient Gender: F           HR:           124 bpm. Exam Location:  [facility] Procedure: 2D Echo, Cardiac Doppler and Color Doppler Indications:     Dyspnea 786.09 / R06.00  History:         Patient has no prior history of Echocardiogram examinations.                  CAD; Risk Factors:Hypertension. MI.  Sonographer:     Alyse Low Roar Referring Phys:  [id number] Georgina Quint LATIF [facility] Diagnosing Phys: Kathlyn Sacramento MD IMPRESSIONS  1. Left ventricular ejection fraction, by estimation, is 40 to 45%. The left ventricle has mildly decreased function. The left ventricle demonstrates regional wall motion abnormalities with possible anterior/anteroseptal hypokinesis. There is mild left ventricular hypertrophy. Left ventricular diastolic parameters are indeterminate.  2. Right ventricular systolic function is normal. The right ventricular size is normal. There is mildly elevated pulmonary artery systolic pressure.  3. Left atrial size was severely dilated.  4. Right atrial size was moderately dilated.  5. The mitral valve is abnormal. Moderate mitral valve regurgitation. No evidence of mitral stenosis.  6. The aortic valve is abnormal. Aortic valve regurgitation is moderate. Mild aortic valve sclerosis is present, with no evidence of aortic valve stenosis. FINDINGS  Left Ventricle: Left ventricular ejection fraction, by estimation, is 40 to 45%. The left ventricle has mildly decreased function. The left ventricle demonstrates regional wall motion abnormalities. The left ventricular internal  cavity size was normal in size. There is mild left ventricular hypertrophy. Left ventricular diastolic parameters are indeterminate. Right Ventricle: The right ventricular  size is normal. No increase in right ventricular wall thickness. Right ventricular systolic function is normal. There is mildly elevated pulmonary artery systolic pressure. The tricuspid regurgitant velocity is 2.69  m/s, and with an assumed right atrial pressure of 10 mmHg, the estimated right ventricular systolic pressure is 38.9 mmHg. Left Atrium: Left atrial size was severely dilated. Right Atrium: Right atrial size was moderately dilated. Pericardium: There is no evidence of pericardial effusion. Mitral Valve: The mitral valve is abnormal. There is moderate thickening of the mitral valve leaflet(s). There is mild calcification of the mitral valve leaflet(s). Normal mobility of the mitral valve leaflets. Moderate mitral annular calcification. Moderate mitral valve regurgitation. No evidence of mitral valve stenosis. Tricuspid Valve: The tricuspid valve is normal in structure. Tricuspid valve regurgitation is not demonstrated. No evidence of tricuspid stenosis. Aortic Valve: The aortic valve is abnormal. Aortic valve regurgitation is moderate. Mild aortic valve sclerosis is present, with no evidence of aortic valve stenosis. Aortic valve mean gradient measures 7.5 mmHg. Aortic valve peak gradient measures 14.1 mmHg. Aortic valve area, by VTI measures 1.73 cm. Pulmonic Valve: The pulmonic valve was normal in structure. Pulmonic valve regurgitation is not visualized. No evidence of pulmonic stenosis. Aorta: The aortic root is normal in size and structure. Venous: The inferior vena cava was not well visualized. IAS/Shunts: No atrial level shunt detected by color flow Doppler.  LEFT VENTRICLE PLAX 2D LVIDd:         4.47 cm  Diastology LVIDs:         3.12 cm  LV e' lateral:   6.64 cm/s LV PW:         1.14 cm  LV E/e' lateral: 23.8 LV IVS:        1.06 cm  LV e' medial:    8.81 cm/s LVOT diam:     1.70 cm  LV E/e' medial:  17.9 LV SV:         52 LV SV Index:   29 LVOT Area:     2.27 cm  RIGHT VENTRICLE RV Mid diam:     2.29 cm RV S prime:     12.70 cm/s TAPSE (M-mode): 1.6 cm LEFT ATRIUM             Index       RIGHT ATRIUM           Index LA diam:        4.20 cm 2.33 cm/m  RA Area:     19.00 cm LA Vol (A2C):   92.9 ml 51.64 ml/m RA Volume:   44.60 ml  24.79 ml/m LA Vol (A4C):   82.8 ml 46.02 ml/m LA Biplane Vol: 91.4 ml 50.80 ml/m  AORTIC VALVE                    PULMONIC VALVE AV Area (Vmax):    1.54 cm     PV Vmax:        1.10 m/s AV Area (Vmean):   1.56 cm     PV Peak grad:   4.8 mmHg AV Area (VTI):     1.73 cm     RVOT Peak grad: 2 mmHg AV Vmax:           187.50 cm/s AV Vmean:          123.000 cm/s AV  VTI:            0.301 m AV Peak Grad:      14.1 mmHg AV Mean Grad:      7.5 mmHg LVOT Vmax:         127.00 cm/s LVOT Vmean:        84.500 cm/s LVOT VTI:          0.229 m LVOT/AV VTI ratio: 0.76  AORTA Ao Root diam: 2.20 cm MITRAL VALVE                TRICUSPID VALVE MV Area (PHT): 3.16 cm     TR Peak grad:   28.9 mmHg MV Decel Time: 240 msec     TR Vmax:        269.00 cm/s MV E velocity: 158.00 cm/s                             SHUNTS                             Systemic VTI:  0.23 m                             Systemic Diam: 1.70 cm Lorine Bears MD Electronically signed by Lorine Bears MD Signature Date/Time: 07/27/2019/8:13:34 AM    Final     Assessment and plan-   #Acute onset of anemia, Iron panel is not typical for iron deficiency.  Ferritin is 55. Normal vitamin B12 and folate level.-Check MMA-pending Reticulocyte panel shows normal reticulocyte hemoglobin, not typical for iron deficiency.  Patient has increased reticulocytosis which is usually seen in hemolysis or blood loss. LDH is normal.  Haptoglobin is pending.  Bilirubin is normal.  Less likely hemolysis. CT 07/22/2019 showed no retroperitoneal hematoma Today's hemoglobin 7.4, further decreased. Type and screen, recommend transfuse if hemoglobin is less than 7. Increase oral iron supplementation to BID.  Rule out GI bleeding.     Leukocytosis, likely secondary to UTI. Peripheral blood smear did not show any blast or fragmented RBCs.  Leukoerythroblastic changes.  Thank you for allowing me to participate in the care of this patient.   Rickard Patience, MD, PhD Hematology Oncology [facility] Pager- [phone] 07/28/2019

## 2019-07-29 LAB — CBC WITH DIFFERENTIAL/PLATELET
Abs Immature Granulocytes: 0.57 10*3/uL — ABNORMAL HIGH (ref 0.00–0.07)
Abs Immature Granulocytes: 0.63 10*3/uL — ABNORMAL HIGH (ref 0.00–0.07)
Basophils Absolute: 0 10*3/uL (ref 0.0–0.1)
Basophils Absolute: 0.1 10*3/uL (ref 0.0–0.1)
Basophils Relative: 0 %
Basophils Relative: 0 %
Eosinophils Absolute: 0.2 10*3/uL (ref 0.0–0.5)
Eosinophils Absolute: 0.1 10*3/uL (ref 0.0–0.5)
Eosinophils Relative: 1 %
Eosinophils Relative: 1 %
HCT: 23.2 % — ABNORMAL LOW (ref 36.0–46.0)
HCT: 24.2 % — ABNORMAL LOW (ref 36.0–46.0)
Hemoglobin: 7.3 g/dL — ABNORMAL LOW (ref 12.0–15.0)
Hemoglobin: 7.7 g/dL — ABNORMAL LOW (ref 12.0–15.0)
Immature Granulocytes: 4 %
Immature Granulocytes: 4 %
Lymphocytes Relative: 12 %
Lymphocytes Relative: 12 %
Lymphs Abs: 1.7 10*3/uL (ref 0.7–4.0)
Lymphs Abs: 1.9 10*3/uL (ref 0.7–4.0)
MCH: 30.8 pg (ref 26.0–34.0)
MCH: 30.4 pg (ref 26.0–34.0)
MCHC: 31.5 g/dL (ref 30.0–36.0)
MCHC: 31.8 g/dL (ref 30.0–36.0)
MCV: 97.9 fL (ref 80.0–100.0)
MCV: 95.7 fL (ref 80.0–100.0)
Monocytes Absolute: 0.6 10*3/uL (ref 0.1–1.0)
Monocytes Absolute: 0.8 10*3/uL (ref 0.1–1.0)
Monocytes Relative: 4 %
Monocytes Relative: 5 %
Neutro Abs: 11.1 10*3/uL — ABNORMAL HIGH (ref 1.7–7.7)
Neutro Abs: 12.4 10*3/uL — ABNORMAL HIGH (ref 1.7–7.7)
Neutrophils Relative %: 79 %
Neutrophils Relative %: 78 %
Platelets: 292 10*3/uL (ref 150–400)
Platelets: 325 10*3/uL (ref 150–400)
RBC: 2.37 MIL/uL — ABNORMAL LOW (ref 3.87–5.11)
RBC: 2.53 MIL/uL — ABNORMAL LOW (ref 3.87–5.11)
RDW: 15.4 % (ref 11.5–15.5)
RDW: 15.7 % — ABNORMAL HIGH (ref 11.5–15.5)
WBC: 14.2 10*3/uL — ABNORMAL HIGH (ref 4.0–10.5)
WBC: 15.9 10*3/uL — ABNORMAL HIGH (ref 4.0–10.5)
nRBC: 0.6 % — ABNORMAL HIGH (ref 0.0–0.2)
nRBC: 0.6 % — ABNORMAL HIGH (ref 0.0–0.2)

## 2019-07-29 LAB — COMPREHENSIVE METABOLIC PANEL
ALT: 41 U/L (ref 0–44)
AST: 31 U/L (ref 15–41)
Albumin: 2.7 g/dL — ABNORMAL LOW (ref 3.5–5.0)
Alkaline Phosphatase: 69 U/L (ref 38–126)
Anion gap: 8 (ref 5–15)
BUN: 19 mg/dL (ref 8–23)
CO2: 22 mmol/L (ref 22–32)
Calcium: 8.6 mg/dL — ABNORMAL LOW (ref 8.9–10.3)
Chloride: 105 mmol/L (ref 98–111)
Creatinine, Ser: 0.73 mg/dL (ref 0.44–1.00)
GFR calc Af Amer: >60 mL/min (ref >60)
GFR calc non Af Amer: >60 mL/min (ref >60)
Glucose, Bld: 105 mg/dL — ABNORMAL HIGH (ref 70–99)
Potassium: 4.5 mmol/L (ref 3.5–5.1)
Sodium: 135 mmol/L (ref 135–145)
Total Bilirubin: 0.8 mg/dL (ref 0.3–1.2)
Total Protein: 5.5 g/dL — ABNORMAL LOW (ref 6.5–8.1)

## 2019-07-29 LAB — MAGNESIUM
Magnesium: 2.3 mg/dL (ref 1.7–2.4)
Magnesium: 2.2 mg/dL (ref 1.7–2.4)

## 2019-07-29 LAB — PHOSPHORUS
Phosphorus: 3.3 mg/dL (ref 2.5–4.6)
Phosphorus: 3.3 mg/dL (ref 2.5–4.6)

## 2019-07-29 MED ORDER — FUROSEMIDE 20 MG PO TABS
20.0000 mg | ORAL_TABLET | Freq: Every day | ORAL | Status: DC
  Administered 2019-07-29 – 2019-08-03 (×6): 20 mg via ORAL
  Filled 2019-07-29 (×6): qty 1

## 2019-07-29 NOTE — Progress Notes (Signed)
Physical Therapy Treatment Patient Details Name: Kimberly Wang MRN: [id number] DOB: October 18, 1930 Today's Date: 07/29/2019    History of Present Illness Patient is an 86 year old female who presented to the emergency room with acute onset of altered mental status with generalized weakness and confusion and significant diminish p.o. intake, vomiting. Chest x-ray showed bilateral small pleural effusions with mild cardiomegaly. Found to have spesis/UTI. PMH includes Parkinson's dementia, CAD, HTN, MI.     PT Comments    Pt is a pleasant 85 year old F who was admitted for sepsis. Pt encountered seated in chair. Pt generally eager to participate, mildly confused, and limited primarily by shortness of breath though O2 saturations remain >92% throughout session. Pt performs STS transfers and stand pivot transfer with min A; pt quickly requests to sit upon standing reporting fatigue, though is able to tolerate short standing bouts with encouragement, demonstrating mild shortness of breath. Pt initially requests to use bathroom at start of session, though states she doesn't have to once [facility] is setup; pt however again requests at end of session and is assisted to bedside commode. Pt able to perform seated there ex this session without difficulty, though fatigue quickly. Pt encouraged to perform pursed lip breathing throughout all mobility tasks.  Pt demonstrates deficits with strength, balance and activity tolerance requiring continued skilled PT intervention to promote return to prior level of function.    Follow Up Recommendations  SNF;Supervision for mobility/OOB     Equipment Recommendations  None recommended by PT    Recommendations for Other Services       Precautions / Restrictions Precautions Precautions: Fall Restrictions Weight Bearing Restrictions: No    Mobility  Bed Mobility Overal bed mobility: Needs Assistance             General bed mobility comments: not assessed this session as  patient sitting up in chair pre and post session   Transfers Overall transfer level: Needs assistance Equipment used: Rolling walker (2 wheeled) Transfers: Sit to/from [employer] Sit to Stand: Min assist Stand pivot transfers: Min assist       General transfer comment: Min A for first stand and CGA for second standing bout. verbal cues for hand placement and safety. patient does fatigue quickly with activity and demonstrates shortness of breath, though O2 sats remain within normal limits. Performs stand pivot transfer to bedside commode.  Ambulation/Gait             General Gait Details: deferred   Stairs             Wheelchair Mobility    Modified Rankin (Stroke Patients Only)       Balance Overall balance assessment: Needs assistance Sitting-balance support: Feet supported;No upper extremity supported Sitting balance-Leahy Scale: Good     Standing balance support: Bilateral upper extremity supported;During functional activity Standing balance-Leahy Scale: Fair Standing balance comment: Pt able to perform self hygiene while standing with 1 UE support following use of BSC. Limited primarily by shortness of breath                            Cognition Arousal/Alertness: Awake/alert Behavior During Therapy: WFL for tasks assessed/performed Overall Cognitive Status: No family/caregiver present to determine baseline cognitive functioning                                 General Comments: patient  does have baseline dementia per notes. However, pt is able to converse and is oriented to self, place and situation. At times however, pt requires redirection as she is easily distracted and requests to use bathroom, but then states she doesn't have to. patient is able to follow single step commands without difficulty      Exercises Total Joint Exercises Ankle Circles/Pumps: AROM;Both;10 reps;Seated Straight Leg Raises:  Strengthening;Both;10 reps;Seated Other Exercises Other Exercises: STS x 3; pt quickly requests to sit each time upon standing, though able to stand a little longer with encouragement.    General Comments        Pertinent Vitals/Pain Pain Assessment: No/denies pain    Home Living                      Prior Function            PT Goals (current goals can now be found in the care plan section) Acute Rehab PT Goals Patient Stated Goal: to return to prior level of function  PT Goal Formulation: With patient Time For Goal Achievement: 08/09/19 Potential to Achieve Goals: Fair Additional Goals Additional Goal #1: bed mobility with Mod I in preparation for eventual home mobility Progress towards PT goals: Progressing toward goals    Frequency    Min 2X/week      PT Plan Current plan remains appropriate    Co-evaluation              AM-PAC PT "6 Clicks" Mobility   Outcome Measure  Help needed turning from your back to your side while in a flat bed without using bedrails?: A Little Help needed moving from lying on your back to sitting on the side of a flat bed without using bedrails?: A Little Help needed moving to and from a bed to a chair (including a wheelchair)?: A Little Help needed standing up from a chair using your arms (e.g., wheelchair or bedside chair)?: A Little Help needed to walk in hospital room?: A Little Help needed climbing 3-5 steps with a railing? : A Lot 6 Click Score: 17    End of Session Equipment Utilized During Treatment: Gait belt Activity Tolerance: Patient limited by fatigue Patient left: in chair;with chair alarm set;with call bell/phone within reach Nurse Communication: Mobility status PT Visit Diagnosis: Other abnormalities of gait and mobility (R26.89);Muscle weakness (generalized) (M62.81);Difficulty in walking, not elsewhere classified (R26.2)     Time: 0932-3557 PT Time Calculation (min) (ACUTE ONLY): 38  min  Charges:  $Therapeutic Exercise: 8-22 mins $Therapeutic Activity: 23-37 mins                     Petra Kuba, PT, DPT 07/29/19, 4:38 PM

## 2019-07-29 NOTE — Progress Notes (Signed)
Arlyss Repress, MD [address]  [address]  [place], [place] [address]  Main: [phone]  Fax: [phone] Pager: [phone]   Subjective: Patient denies any GI complaints today.  Patient is sitting up in chair, getting ready to eat lunch. Patient is originally admitted on 5/29 secondary to UTI sepsis, generalized weakness, resulted in volume overload secondary to aggressive fluid resuscitation.  She has history of A. fib, rate controlled and anticoagulated with Eliquis, history of coronary artery disease s/p PCI 15 years ago  Objective: Vital signs in last 24 hours: Vitals:   07/28/19 1929 07/29/19 0445 07/29/19 0719 07/29/19 1134  BP: (!) 137/50 (!) 155/76 130/76 (!) 123/56  Pulse: 72 98 93 68  Resp: 20 18 17 17   Temp: 98.1 F (36.7 C) 97.9 F (36.6 C) 98.2 F (36.8 C) 98.3 F (36.8 C)  TempSrc: Oral Oral    SpO2: 100% 100% 100% 98%  Weight:  76.5 kg    Height:       Weight change: 0.045 kg  Intake/Output Summary (Last 24 hours) at 07/29/2019 1432 Last data filed at 07/29/2019 1350 Gross per 24 hour  Intake 720 ml  Output --  Net 720 ml     Exam: Heart:: Regular rate and rhythm, S1S2 present or without murmur or extra heart sounds Lungs: normal and clear to auscultation Abdomen: soft, nontender, normal bowel sounds   Lab Results: CBC Latest Ref Rng & Units 07/29/2019 07/29/2019 07/28/2019  WBC 4.0 - 10.5 K/uL 15.9(H) 14.2(H) 18.5(H)  Hemoglobin 12.0 - 15.0 g/dL 7.7(L) 7.3(L) 7.4(L)  Hematocrit 36.0 - 46.0 % 24.2(L) 23.2(L) 23.4(L)  Platelets 150 - 400 K/uL 325 292 269   CMP Latest Ref Rng & Units 07/29/2019 07/28/2019 07/27/2019  Glucose 70 - 99 mg/dL 09/26/2019) 546(E) 703(J)  BUN 8 - 23 mg/dL 19 23 009(F)  Creatinine 0.44 - 1.00 mg/dL 81(W 2.99 3.71  Sodium 135 - 145 mmol/L 135 137 137  Potassium 3.5 - 5.1 mmol/L 4.5 4.6 4.3  Chloride 98 - 111 mmol/L 105 108 105  CO2 22 - 32 mmol/L 22 24 24   Calcium 8.9 - 10.3 mg/dL 6.96) ) 7.8(L)  Total Protein 6.5 -  8.1 g/dL 3.8(B) 5.2(L) 5.0(L)  Total Bilirubin 0.3 - 1.2 mg/dL 0.8 0.5 0.7  Alkaline Phos 38 - 126 U/L 69 69 63  AST 15 - 41 U/L 31 36 27  ALT 0 - 44 U/L 41 42 30    Micro Results: Recent Results (from the past 240 hour(s))  Culture, blood (Routine x 2)     Status: None   Collection Time: 07/21/19 10:07 PM   Specimen: BLOOD  Result Value Ref Range Status   Specimen Description BLOOD LEFT ANTECUBITAL  Final   Special Requests   Final    BOTTLES DRAWN AEROBIC AND ANAEROBIC Blood Culture results may not be optimal due to an excessive volume of blood received in culture bottles   Culture   Final    NO GROWTH 5 DAYS Performed at [facility], [address] [place], [address] [place]    Report Status 07/26/2019 FINAL  Final  Culture, blood (Routine x 2)     Status: None   Collection Time: 07/21/19 10:51 PM   Specimen: BLOOD  Result Value Ref Range Status   Specimen Description BLOOD BLOOD RIGHT ARM  Final   Special Requests   Final    BOTTLES DRAWN AEROBIC AND ANAEROBIC Blood Culture adequate volume   Culture  Final    NO GROWTH 5 DAYS Performed at [facility], [address] [place], [place] [address]    Report Status 07/26/2019 FINAL  Final  SARS Coronavirus 2 by RT PCR (hospital order, performed in [facility] hospital lab) Nasopharyngeal Nasopharyngeal Swab     Status: None   Collection Time: 07/22/19 12:34 AM   Specimen: Nasopharyngeal Swab  Result Value Ref Range Status   SARS Coronavirus 2 NEGATIVE NEGATIVE Final    Comment: (NOTE) SARS-CoV-2 target nucleic acids are NOT DETECTED. The SARS-CoV-2 RNA is generally detectable in upper and lower respiratory specimens during the acute phase of infection. The lowest concentration of SARS-CoV-2 viral copies this assay can detect is 250 copies / mL. A negative result does not preclude SARS-CoV-2 infection and should not be used as the sole basis for treatment or other patient management decisions.  A  negative result may occur with improper specimen collection / handling, submission of specimen other than nasopharyngeal swab, presence of viral mutation(s) within the areas targeted by this assay, and inadequate number of viral copies (<250 copies / mL). A negative result must be combined with clinical observations, patient history, and epidemiological information. Fact Sheet for Patients:   BoilerBrush.com.cy Fact Sheet for Healthcare Providers: https://pope.com/ This test is not yet approved or cleared  by the [place] FDA and has been authorized for detection and/or diagnosis of SARS-CoV-2 by FDA under an Emergency Use Authorization (EUA).  This EUA will remain in effect (meaning this test can be used) for the duration of the COVID-19 declaration under Section 564(b)(1) of the Act, 21 U.S.C. section 360bbb-3(b)(1), unless the authorization is terminated or revoked sooner. Performed at [facility], [address] [place], [place] [address]   Urine Culture     Status: Abnormal   Collection Time: 07/22/19 12:54 AM   Specimen: Urine, Random  Result Value Ref Range Status   Specimen Description   Final    URINE, RANDOM Performed at [facility], [address] [place], [place] [address]    Special Requests   Final    NONE Performed at [facility], [address] [place], [place] [address]    Culture >=100,000 COLONIES/mL ESCHERICHIA COLI (A)  Final   Report Status 07/25/2019 FINAL  Final   Organism ID, Bacteria ESCHERICHIA COLI (A)  Final      Susceptibility   Escherichia coli - MIC*    AMPICILLIN >=32 RESISTANT Resistant     CEFAZOLIN <=4 SENSITIVE Sensitive     CEFTRIAXONE <=1 SENSITIVE Sensitive     CIPROFLOXACIN 0.5 SENSITIVE Sensitive     GENTAMICIN <=1 SENSITIVE Sensitive     IMIPENEM <=0.25 SENSITIVE Sensitive     NITROFURANTOIN <=16 SENSITIVE Sensitive     TRIMETH/SULFA >=320  RESISTANT Resistant     AMPICILLIN/SULBACTAM 4 SENSITIVE Sensitive     PIP/TAZO <=4 SENSITIVE Sensitive     * >=100,000 COLONIES/mL ESCHERICHIA COLI   Studies/Results: No results found. Medications:  I have reviewed the patient's current medications. Prior to Admission:  Medications Prior to Admission  Medication Sig Dispense Refill Last Dose  . atorvastatin (LIPITOR) 80 MG tablet Take by mouth.     . diltiazem (CARDIZEM CD) 240 MG 24 hr capsule      . donepezil (ARICEPT) 10 MG tablet      . ELIQUIS 5 MG TABS tablet      . FEROSUL 325 (65 Fe) MG tablet      . furosemide (LASIX)  80 MG tablet      . memantine (NAMENDA) 10 MG tablet      . Multiple Vitamin (MULTIVITAMIN) capsule Take by mouth.     . nitroGLYCERIN (NITROSTAT) 0.4 MG SL tablet Place under the tongue.     . potassium chloride SA (KLOR-CON) 20 MEQ tablet Take by mouth.     . sertraline (ZOLOFT) 50 MG tablet      . spironolactone (ALDACTONE) 25 MG tablet Take by mouth.      Scheduled: . atorvastatin  80 mg Oral Daily  . diltiazem  240 mg Oral Daily  . donepezil  10 mg Oral QHS  . ferrous sulfate  325 mg Oral BID WC  . furosemide  20 mg Oral Daily  . megestrol  400 mg Oral Daily  . memantine  10 mg Oral Daily  . multivitamin with minerals  1 tablet Oral Daily  . pantoprazole (PROTONIX) IV  40 mg Intravenous Q12H  . potassium chloride  40 mEq Oral Once  . potassium chloride SA  20 mEq Oral Daily   Continuous:  VPX:TGGYIRSWNIOEV **OR** acetaminophen, ipratropium-albuterol, magnesium hydroxide, nitroGLYCERIN, ondansetron **OR** ondansetron (ZOFRAN) IV, QUEtiapine, traZODone Anti-infectives (From admission, onward)   Start     Dose/Rate Route Frequency Ordered Stop   07/26/19 0600  cephALEXin (KEFLEX) capsule 500 mg     500 mg Oral Every 6 hours 07/25/19 1718 07/29/19 0559   07/22/19 0900  cefTRIAXone (ROCEPHIN) 1 g in sodium chloride 0.9 % 100 mL IVPB  Status:  Discontinued     1 g 200 mL/hr over 30 Minutes  Intravenous Every 24 hours 07/22/19 0248 07/25/19 1714   07/22/19 0145  vancomycin (VANCOCIN) IVPB 1000 mg/200 mL premix     1,000 mg 200 mL/hr over 60 Minutes Intravenous  Once 07/22/19 0144 07/22/19 0349   07/22/19 0045  ceFEPIme (MAXIPIME) 1 g in sodium chloride 0.9 % 100 mL IVPB     1 g 200 mL/hr over 30 Minutes Intravenous  Once 07/22/19 0039 07/22/19 0202     Scheduled Meds: . atorvastatin  80 mg Oral Daily  . diltiazem  240 mg Oral Daily  . donepezil  10 mg Oral QHS  . ferrous sulfate  325 mg Oral BID WC  . furosemide  20 mg Oral Daily  . megestrol  400 mg Oral Daily  . memantine  10 mg Oral Daily  . multivitamin with minerals  1 tablet Oral Daily  . pantoprazole (PROTONIX) IV  40 mg Intravenous Q12H  . potassium chloride  40 mEq Oral Once  . potassium chloride SA  20 mEq Oral Daily   Continuous Infusions: PRN Meds:.acetaminophen **OR** acetaminophen, ipratropium-albuterol, magnesium hydroxide, nitroGLYCERIN, ondansetron **OR** ondansetron (ZOFRAN) IV, QUEtiapine, traZODone   Assessment: Active Problems:   Coagulation defect (HCC)   Sepsis due to gram-negative UTI (HCC)   Severe sepsis (HCC)   Dementia due to Parkinson's disease without behavioral disturbance (HCC)   Benign essential HTN   CAD (coronary artery disease)   Anemia   Sepsis due to urinary tract infection (HCC)   Plan: Acute anemia, unclear etiology, no evidence of active GI bleed during this admission other than elevated BUN to creatinine ratio during this admission.  50/0.66 on 6/3 which is suggestive of upper GI bleed CT abdomen pelvis on admission revealed moderate sized paraesophageal hernia With no other source of anemia identified per hematology, recommend endoscopic evaluation Recommend EGD,  Last dose of Eliquis on 9/5, need to wait at least for 48  hours before proceeding with endoscopic evaluation Agree with Protonix 40 mg IV twice daily Monitor CBC closely and transfuse to maintain hemoglobin  greater than 8 Recommend to stop oral iron as inpatient  GI will follow along with you     LOS: 7 days   Myka Hitz 07/29/2019, 2:32 PM

## 2019-07-29 NOTE — Plan of Care (Signed)
  Problem: Education: Goal: Knowledge of General Education information will improve Description: Including pain rating scale, medication(s)/side effects and non-pharmacologic comfort measures Outcome: Progressing   Problem: Health Behavior/Discharge Planning: Goal: Ability to manage health-related needs will improve Outcome: Progressing Note: Cardiology is recommending d/c at this point, and PT is recommending that be to a SNF. Most likely to happen within 1 - 2 days. Will continue to monitor overall condition for the remainder of the shift. Jari Favre [facility]

## 2019-07-29 NOTE — Progress Notes (Signed)
PROGRESS NOTE    Kimberly Wang  JJK:[id number] DOB: [birth year] DOA: 07/22/2019 PCP: Kimberly Housekeeper, MD   Brief Narrative:  HPI per Dr. Valente David on 07/22/2019 Kimberly Wang  is a 86 y.o. female with a known history of coronary artery disease, hypertension and dementia, who presented to the emergency room with acute onset of altered mental status with generalized weakness and confusion and significant diminish p.o. intake especially today.  She vomited once.  She was having low-grade fever without chills.  She denied any abdominal pain.  No significant dysuria, hematuria or urgency or frequency or flank pain.  No cough or wheezing or dyspnea.  Upon presentation to the emergency room, temperature was 100.2 and heart rate 101 with otherwise normal vital signs.  CMP revealed hyponatremia and hypokalemia as well as hypochloremia with blood glucose of 223, BUN of 18 and creatinine 1.14, albumin of 3.4.  Lactic acid was 3.8 and later 3.7.  High-sensitivity troponin I was 60.  CBC showed leukocytosis of 25.1 with neutrophilia.  Urinalysis showed 6-10 WBCs with positive nitrite and trace leukocytes.  Blood cultures were drawn and urine culture was sent.  Abdominal and pelvic CT scan revealed paraesophageal hernia with no acute findings.  Chest x-ray showed bilateral small pleural effusions with mild cardiomegaly.  The patient was given 1 g of p.o. Tylenol, a gram of IV cefepime and a gram of IV vancomycin, 1 L of lactated Ringer, 10 medical IV potassium chloride and 40 mEq p.o. as well as 1 L IV normal saline.  She will be admitted to the progressive unit bed for further evaluation and management.  **Interim History Remains pleasantly demented but will need PT OT to further evaluate and treat and now they are recommending SNF. Antibiotics have been changed to p.o.  She was wheezing a little bit her IV fluids have been stopped and she is given a dose of IV Lasix the day before yesterday and again yesterday  and will be given another one today. Cardiology was consulted for concern of CHF and Volume overload. She remains in Atrial Fibrillation but has had some faster heart rates.  Because of concern for GI bleed as her hemoglobin has dropped significantly along with an elevated BUN gastroenterology was consulted and they feel that the patient needs further hematological work-up to determine etiology of her anemia before proceeding with any endoscopic procedure.  Patient is unsure about having endoscopic procedure.  Fecal occult test has been pending.  Her Eliquis has been held for now.  Will need to continue monitor for signs and symptoms of bleeding.  Cardiology she still is not in overt heart failure recommending continuing to carefully diurese and following renal function and symptoms given that she is still 6.5 L positive.  Cardiology recommends discharging from their standpoint on current medications with Lipitor, diltiazem and K-Dur 20 milliequivalents and following up with Jonelle Sidle at [facility]  Hematology was consulted given her acute anemia and Dr. Cathie Hoops assisting and he recommends checking an MMA which is currently pending.  He is also recommended checking a haptoglobin which is also pending but he feels that hemolysis is less likely.  Because her hemoglobin continues to decrease further he recommends transfusion if it is below 7 and increasing will iron supplementation to twice daily.  He feels that we will need to rule out GI bleeding and cardiology recommending resuming Eliquis 5 mg twice daily as they do not feel that she is bleeding, however with her continuing drop in  hemoglobin I do not feel that this is secondary to hydration and will hold until we figure out the etiology of her blood count being lower   Assessment & Plan:   Active Problems:   Coagulation defect ([place])   Sepsis due to gram-negative UTI (HCC)   Severe sepsis (HCC)   Dementia due to Parkinson's disease without behavioral disturbance  (HCC)   Benign essential HTN   CAD (coronary artery disease)   Anemia   Sepsis due to urinary tract infection (HCC)  Severe sepsis/ positive E. coli UTI -HR> 90, WBC> 12, lactic acid> 2 -Complete 5-day course of antibiotics -Stopped  Normal saline 37ml/hr given her Wheezing and Dyspnea -Give 1x of IV Lasix yesterday and she has breathing treatments and given another dose today  -Repeat chest x-ray 07/27/2019 showed "Cardiomegaly and mild bilateral interstitial prominence and small pleural effusions" -6/2 change to oral antibiotics to po Keflex -PT/OT to further evaluate and Treat; OT done recommending home health OT still need PT evaluation if family cannot look after her 24/7 then they recommend SNF for increased safety and functional strengthening before returning home -WBC was improving and went from 14.8 -> 11.9 -> 16.6 continues to trend upwards and is now 18.5 day before yesterday and now trending down and intermittently fluctuating is 14.2 yesterday and today is 15.9; She does have a Chronic Leukocytosis at baseline; otology feels that leukocytosis most likely secondary to her UTI and Dr. Tasia Catchings discussed with the pathologist Dr. Liliane Bade who looked at her smear and there is no blasts or fragmented RBCs.  There is reactive Leukoerythroblastic changes blood loss versus hemolysis -Patient procalcitonin level has been intermittently elevated and ranging from 0.68 and is trended down to 0.49 is now 0.54 -lactic acid level was 2.1 and went up to 4.1 is now down to 2.8  -repeat lactic acid level was 1.1 -We will discuss with the [facility] team and if she will go home she can likely go home in the next 24 to 48 hours versus SNF; PT/OT recommending SNF  AcuteMetabolic Encephalopathy superimposed on chronic dementia -Multifactorial to include severe sepsis, hyponatremia.  Son was able to give accurate baseline a few days ago to my colleague  -C/w Neurochecks q 4 hrs -Aricept 10 mg daily -C/w Memantine 10 mg  daily and continue with quetiapine 25 mg p.o. nightly as needed for agitation and sleep -Continue with delirium precautions -Minimize sedating medication  A. fib with RVR -Rates have been intermittently high and cardiology recommending continuing diltiazem and following -they recommending continue anticoagulation with Eliquis and continue telemetry monitoring -Eliquis had been held due to concern for GI bleed and hemoglobin continues to drop per cardiology resumed at this morning and I have now discontinued it again as GI is planning to scope the patient  days now and recommending EGD once her Eliquis has been held for 2 days  Acute on Chronic Diastolic CHF -Mild in the setting of volume overload -BNP was elevated 125  -Chest x-ray did show a CHF pattern picture -Given IV Lasix this hospitalization has now stopped -cardiology does not feel that she is in acute heart failure -She is + 7.033 L since admission and weight is up 8 pounds  -She was given IV Lasix 20 milligrams 3 days neuro and has been held yesterday -We will defer to cardiology for further Lasix usage -Continue to monitor for signs and symptoms of volume overload  Hyponatremia, this is improved -Multifactorial volume depletion, SSRI, anorexia. -Correct underlying factors -  IVF now stopped and will be given a dose of IV Lasix 20 mg for the last 3 days but will hold yesterday; Defer further Lasix to Cardiology -Zoloft 50 mg daily (hold) -Megace 400 mg daily -Sodium is now 135  Elevated troponin high-sensitivity/demand ischemia She has a history of CAD status post PCI done in 2006 -Troponin was 60 and repeat was 64 -Not consistent with ACS.  Most consistent with demand ischemia. -Not complaining of any Chest Pain -Continue with apixaban 5 mg p.o. twice daily once we know that she is not bleeding -Continue atorvastatin 80 mg p.o. daily, and nitroglycerin 0.4 mg sublingual every 5 minutes as needed chest pain -Currently does  not have any chest pain cardiac markers are unremarkable  Essential HTN -C/w Cardizem 240 mg daily  Dyslipidemia -C/w Lipitor 80 mg daily -Lipid panel done showed a total cholesterol/HDL ratio of 4.2, cholesterol of 172, HDL level of 17, LDL 38, triglycerides of 86 and VLDL of 17  Coagulation defect -Currently was holding Apixaban 5 mg BID as there is concern for GIB and will need to figure out if she is and then if she is not we will need to resume her apixaban when able cardiology had resumed her this morning and she got her dose.  Will discontinue it again given her continued drop in hemoglobin and hold for possible EGD  Elevated BUN -Patient is BUN peaked at 50 and is now improved and is 19 -check FOBT to rule out Upper GI bleeding as her hemoglobin has been slowly dropping further and still has not been done -Currently not on steroids -continue to monitor carefully and repeat CMP in a.m. -Gastroenterology is recommending EGD  Normocytic Anemia -Patient hemoglobin/hematocrit has been slowly dropping and went from 10.6/31.8 down to 9.2/28.3 and has further declined to 7.4/23.4 yesterday and today it is slightly better at 7.7/24.2 -Checked anemia panel and showed iron level of 30, U IBC of 233, TIBC of 263, saturation ratio is 11%, ferritin level 55, folate level 14.8, vitamin B12 level 440 -Check FOBT to rule out GI bleeding and this is still pending and still not been done no evidence of any active GI bleeding currently though -Anticoagulated with apixaban but will currently hold for now and resume as soon as we can at the recommendations of cardiology -continue monitor for signs and symptoms bleeding; currently no overt bleeding noted -Consulted Gastroenterology for anemia work-up and they are deferring the endoscopic procedures for now and want a hematological work-up -Hematology Dr. Cathie Hoops has been consulted and he feels that the iron panel is not typical for iron deficiency.  He has  recommended obtaining a reticulocyte panel and obtain LDH and haptoglobin.  LDH was normal.  Dr. Cathie Hoops and I discussed and he feels Increased reticulocytosis can be seen in acute GI bleeding or hemolysis with patient's T bili is normal less likely hemolysis but we are still checking LDH and haptoglobin (Pending) -Appreciate Heme-Onc as well as GI evaluation; GI planning on doing a endoscopy now however she had been off of anticoagulation for 2 days almost and then cardiology resumed her Eliquis yesterday morning and her last dose of Eliquis was on 07/28/2019 and she received a dose and I have now discontinued it again -Repeat CBC with differential in a.m. and also continue daily  Hypokalemia -Goal of potassium level> 4 -Potassium today is 4.5 -continue to monitor and replete as necessary -Repeat CMP in a.m.  Hypomagnesemia -Mag Level was 2.2 today -Continue to monitor  replete as necessary -Repeat CBC in a.m.  Hypophosphatemia -Goal> 2.5 -Patient's phosphorus is now 3.3 -Continue to monitor and treat as necessary  DVT prophylaxis: SCDs; Anticoagulated with Apxiaban but have held it for now given her continued drop in Hgb and will need to be off of it for 2 days for possible EGD Code Status: FULL CODE Family Communication: No family present at bedside  Disposition Plan: Further evaluation by PT OT for safe discharge disposition and they are recommending SNF.  Her antibiotics have been now changed to p.o. cardiology is consulted given her concern for volume overload and CHF; because there is concern of her having a drop in hemoglobin GI and hematology were both consulted.  GI is planning on doing EGD eventually  Status is: Inpatient  Remains inpatient appropriate because:Unsafe d/c plan and Inpatient level of care appropriate due to severity of illness   Dispo: The patient is from: Home              Anticipated d/c is to: TBD; SNF vs. Home Health PT/OT              Anticipated d/c date is: 1  day              Patient currently is not medically stable to d/c.  Consultants:   Cardiology  Gastroenterology  Hematology    Procedures: None  Antimicrobials:  Anti-infectives (From admission, onward)   Start     Dose/Rate Route Frequency Ordered Stop   07/26/19 0600  cephALEXin (KEFLEX) capsule 500 mg     500 mg Oral Every 6 hours 07/25/19 1718 07/29/19 0559   07/22/19 0900  cefTRIAXone (ROCEPHIN) 1 g in sodium chloride 0.9 % 100 mL IVPB  Status:  Discontinued     1 g 200 mL/hr over 30 Minutes Intravenous Every 24 hours 07/22/19 0248 07/25/19 1714   07/22/19 0145  vancomycin (VANCOCIN) IVPB 1000 mg/200 mL premix     1,000 mg 200 mL/hr over 60 Minutes Intravenous  Once 07/22/19 0144 07/22/19 0349   07/22/19 0045  ceFEPIme (MAXIPIME) 1 g in sodium chloride 0.9 % 100 mL IVPB     1 g 200 mL/hr over 30 Minutes Intravenous  Once 07/22/19 0039 07/22/19 0202     Subjective: Seen and examined at bedside and she again was sitting in her chair with no complaints.  States that she looks great.  No nausea or vomiting.  Denies any lightheadedness or dizziness.  No other concerns or complaints at this time.  Objective: Vitals:   07/29/19 0445 07/29/19 0719 07/29/19 1134 07/29/19 1612  BP: (!) 155/76 130/76 (!) 123/56 136/83  Pulse: 98 93 68 78  Resp: 18 17 17 18   Temp: 97.9 F (36.6 C) 98.2 F (36.8 C) 98.3 F (36.8 C) 97.9 F (36.6 C)  TempSrc: Oral  Oral   SpO2: 100% 100% 98% 100%  Weight: 76.5 kg     Height:        Intake/Output Summary (Last 24 hours) at 07/29/2019 1619 Last data filed at 07/29/2019 1350 Gross per 24 hour  Intake 720 ml  Output --  Net 720 ml   Filed Weights   07/27/19 0459 07/28/19 0452 07/29/19 0445  Weight: 75.3 kg 76.4 kg 76.5 kg   Examination: Physical Exam:  Constitutional: WN/WD demented Caucasian female currently in no acute distress sitting in the chair at bedside looking out the window again. Eyes: Lids and conjunctivae normal, sclerae  anicteric  ENMT: External Ears, Nose  appear normal. Grossly normal hearing.  Neck: Appears normal, supple, no cervical masses, normal ROM, no appreciable thyromegaly,: No JVD Respiratory: Diminished to auscultation bilaterally, no wheezing, rales, rhonchi or crackles. Normal respiratory effort and patient is not tachypenic. No accessory muscle use.  Unlabored breathing Cardiovascular: Irregularly irregular but not tachycardic today.  Has a 2 out of 6 systolic murmur.. S1 and S2 auscultated.  Mild extremity edema.  Abdomen: Soft, non-tender, distended secondary to body habitus. Bowel sounds positive.  GU: Deferred. Musculoskeletal: No clubbing / cyanosis of digits/nails. No joint deformity upper and lower extremities.  Skin: No rashes, lesions, ulcers on limited skin evaluation. No induration; Warm and dry.  Neurologic: CN 2-12 grossly intact with no focal deficits. Romberg sign and cerebellar reflexes not assessed.  Psychiatric: Impaired judgment and insight. Alert and oriented x 2. Normal mood and appropriate affect.   Data Reviewed: I have personally reviewed following labs and imaging studies  CBC: Recent Labs  Lab 07/26/19 0443 07/27/19 0447 07/28/19 0454 07/29/19 0731 07/29/19 0856  WBC 15.6* 16.6* 18.5* 14.2* 15.9*  NEUTROABS 12.3* 13.0* 14.4* 11.1* 12.4*  HGB 8.5* 7.8* 7.4* 7.3* 7.7*  HCT 25.8* 24.2* 23.4* 23.2* 24.2*  MCV 94.2 96.4 98.3 97.9 95.7  PLT 202 232 269 292 325   Basic Metabolic Panel: Recent Labs  Lab 07/25/19 0527 07/25/19 0527 07/26/19 0443 07/27/19 0447 07/28/19 0454 07/29/19 0731 07/29/19 0856  NA 142  --  138 137 137  --  135  K 4.5  --  4.8 4.3 4.6  --  4.5  CL 113*  --  107 105 108  --  105  CO2 22  --  25 24 24   --  22  GLUCOSE 121*  --  133* 134* 118*  --  105*  BUN 40*  --  50* 34* 23  --  19  CREATININE 0.75  --  0.66 0.80 0.82  --  0.73  CALCIUM 8.5*  --  8.5* 8.6* 8.6*  --  8.6*  MG 2.4   < > 2.0 2.1 2.1 2.3 2.2  PHOS 3.3   < > 2.7 2.9  3.3 3.3 3.3   < > = values in this interval not displayed.   GFR: Estimated Creatinine Clearance: 50.5 mL/min (by C-G formula based on SCr of 0.73 mg/dL). Liver Function Tests: Recent Labs  Lab 07/25/19 0527 07/26/19 0443 07/27/19 0447 07/28/19 0454 07/29/19 0856  AST 31 26 27  36 31  ALT 33 30 30 42 41  ALKPHOS 69 60 63 69 69  BILITOT 0.4 0.4 0.7 0.5 0.8  PROT 5.0* 4.9* 5.0* 5.2* 5.5*  ALBUMIN 2.3* 2.3* 2.3* 2.4* 2.7*   No results for input(s): LIPASE, AMYLASE in the last 168 hours. No results for input(s): AMMONIA in the last 168 hours. Coagulation Profile: Recent Labs  Lab 07/27/19 1531  INR 1.5*   Cardiac Enzymes: No results for input(s): CKTOTAL, CKMB, CKMBINDEX, TROPONINI in the last 168 hours. BNP (last 3 results) No results for input(s): PROBNP in the last 8760 hours. HbA1C: No results for input(s): HGBA1C in the last 72 hours. CBG: No results for input(s): GLUCAP in the last 168 hours. Lipid Profile: No results for input(s): CHOL, HDL, LDLCALC, TRIG, CHOLHDL, LDLDIRECT in the last 72 hours. Thyroid Function Tests: No results for input(s): TSH, T4TOTAL, FREET4, T3FREE, THYROIDAB in the last 72 hours. Anemia Panel: Recent Labs    07/27/19 0447 07/27/19 1707  VITAMINB12 440  --   FOLATE 14.8  --  FERRITIN 55  --   TIBC 263  --   IRON 30  --   RETICCTPCT 2.7 3.8*   Sepsis Labs: Recent Labs  Lab 07/22/19 1855 07/23/19 0433 07/23/19 1342 07/23/19 1638 07/24/19 0528 07/25/19 0527 07/26/19 0443  PROCALCITON  --  0.68  --   --  0.49 0.54  --   LATICACIDVEN 2.1*  --  4.1* 2.8*  --   --  1.0    Recent Results (from the past 240 hour(s))  Culture, blood (Routine x 2)     Status: None   Collection Time: 07/21/19 10:07 PM   Specimen: BLOOD  Result Value Ref Range Status   Specimen Description BLOOD LEFT ANTECUBITAL  Final   Special Requests   Final    BOTTLES DRAWN AEROBIC AND ANAEROBIC Blood Culture results may not be optimal due to an excessive  volume of blood received in culture bottles   Culture   Final    NO GROWTH 5 DAYS Performed at [facility], [address] [place], [place] [address]    Report Status 07/26/2019 FINAL  Final  Culture, blood (Routine x 2)     Status: None   Collection Time: 07/21/19 10:51 PM   Specimen: BLOOD  Result Value Ref Range Status   Specimen Description BLOOD BLOOD RIGHT ARM  Final   Special Requests   Final    BOTTLES DRAWN AEROBIC AND ANAEROBIC Blood Culture adequate volume   Culture   Final    NO GROWTH 5 DAYS Performed at [facility], [address] [place], [place] [address]    Report Status 07/26/2019 FINAL  Final  SARS Coronavirus 2 by RT PCR (hospital order, performed in [facility] hospital lab) Nasopharyngeal Nasopharyngeal Swab     Status: None   Collection Time: 07/22/19 12:34 AM   Specimen: Nasopharyngeal Swab  Result Value Ref Range Status   SARS Coronavirus 2 NEGATIVE NEGATIVE Final    Comment: (NOTE) SARS-CoV-2 target nucleic acids are NOT DETECTED. The SARS-CoV-2 RNA is generally detectable in upper and lower respiratory specimens during the acute phase of infection. The lowest concentration of SARS-CoV-2 viral copies this assay can detect is 250 copies / mL. A negative result does not preclude SARS-CoV-2 infection and should not be used as the sole basis for treatment or other patient management decisions.  A negative result may occur with improper specimen collection / handling, submission of specimen other than nasopharyngeal swab, presence of viral mutation(s) within the areas targeted by this assay, and inadequate number of viral copies (<250 copies / mL). A negative result must be combined with clinical observations, patient history, and epidemiological information. Fact Sheet for Patients:   BoilerBrush.com.cyhttps://www.fda.gov/media/136312/download Fact Sheet for Healthcare Providers: https://pope.com/https://www.fda.gov/media/136313/download This test is not yet  approved or cleared  by the [place] FDA and has been authorized for detection and/or diagnosis of SARS-CoV-2 by FDA under an Emergency Use Authorization (EUA).  This EUA will remain in effect (meaning this test can be used) for the duration of the COVID-19 declaration under Section 564(b)(1) of the Act, 21 U.S.C. section 360bbb-3(b)(1), unless the authorization is terminated or revoked sooner. Performed at [facility], [address] [place], [place] [address]   Urine Culture     Status: Abnormal   Collection Time: 07/22/19 12:54 AM   Specimen: Urine, Random  Result Value Ref Range Status   Specimen Description   Final    URINE, RANDOM Performed at [facility], [address] [place], [place] [address]    Special Requests   Final    NONE Performed at [facility], [address] [place], [place] [address]    Culture >=100,000 COLONIES/mL ESCHERICHIA COLI (A)  Final   Report Status 07/25/2019 FINAL  Final   Organism ID, Bacteria ESCHERICHIA COLI (A)  Final      Susceptibility   Escherichia coli - MIC*    AMPICILLIN >=32 RESISTANT Resistant     CEFAZOLIN <=4 SENSITIVE Sensitive     CEFTRIAXONE <=1 SENSITIVE Sensitive     CIPROFLOXACIN 0.5 SENSITIVE Sensitive     GENTAMICIN <=1 SENSITIVE Sensitive     IMIPENEM <=0.25 SENSITIVE Sensitive     NITROFURANTOIN <=16 SENSITIVE Sensitive     TRIMETH/SULFA >=320 RESISTANT Resistant     AMPICILLIN/SULBACTAM 4 SENSITIVE Sensitive     PIP/TAZO <=4 SENSITIVE Sensitive     * >=100,000 COLONIES/mL ESCHERICHIA COLI     RN Pressure Injury Documentation:     Estimated body mass index is 28.94 kg/m as calculated from the following:   Height as of this encounter: 5\' 4"  (1.626 m).   Weight as of this encounter: 76.5 kg.  Malnutrition Type:      Malnutrition Characteristics:      Nutrition Interventions:     Radiology Studies: No results found.  Scheduled Meds: . atorvastatin  80 mg  Oral Daily  . diltiazem  240 mg Oral Daily  . donepezil  10 mg Oral QHS  . furosemide  20 mg Oral Daily  . megestrol  400 mg Oral Daily  . memantine  10 mg Oral Daily  . multivitamin with minerals  1 tablet Oral Daily  . pantoprazole (PROTONIX) IV  40 mg Intravenous Q12H  . potassium chloride  40 mEq Oral Once  . potassium chloride SA  20 mEq Oral Daily   Continuous Infusions:   LOS: 7 days   , DO [facility] PAGER is on AMION  If 7PM-7AM, please contact night-coverage [email or web address]

## 2019-07-30 LAB — COMPREHENSIVE METABOLIC PANEL
ALT: 41 U/L (ref 0–44)
AST: 33 U/L (ref 15–41)
Albumin: 2.7 g/dL — ABNORMAL LOW (ref 3.5–5.0)
Alkaline Phosphatase: 72 U/L (ref 38–126)
Anion gap: 9 (ref 5–15)
BUN: 18 mg/dL (ref 8–23)
CO2: 23 mmol/L (ref 22–32)
Calcium: 8.6 mg/dL — ABNORMAL LOW (ref 8.9–10.3)
Chloride: 104 mmol/L (ref 98–111)
Creatinine, Ser: 0.8 mg/dL (ref 0.44–1.00)
GFR calc Af Amer: >60 mL/min (ref >60)
GFR calc non Af Amer: >60 mL/min (ref >60)
Glucose, Bld: 119 mg/dL — ABNORMAL HIGH (ref 70–99)
Potassium: 4.6 mmol/L (ref 3.5–5.1)
Sodium: 136 mmol/L (ref 135–145)
Total Bilirubin: 0.6 mg/dL (ref 0.3–1.2)
Total Protein: 5.6 g/dL — ABNORMAL LOW (ref 6.5–8.1)

## 2019-07-30 LAB — CBC WITH DIFFERENTIAL/PLATELET
Abs Immature Granulocytes: 0.61 10*3/uL — ABNORMAL HIGH (ref 0.00–0.07)
Basophils Absolute: 0.1 10*3/uL (ref 0.0–0.1)
Basophils Relative: 0 %
Eosinophils Absolute: 0.1 10*3/uL (ref 0.0–0.5)
Eosinophils Relative: 1 %
HCT: 25.1 % — ABNORMAL LOW (ref 36.0–46.0)
Hemoglobin: 8 g/dL — ABNORMAL LOW (ref 12.0–15.0)
Immature Granulocytes: 4 %
Lymphocytes Relative: 14 %
Lymphs Abs: 2.4 10*3/uL (ref 0.7–4.0)
MCH: 30.9 pg (ref 26.0–34.0)
MCHC: 31.9 g/dL (ref 30.0–36.0)
MCV: 96.9 fL (ref 80.0–100.0)
Monocytes Absolute: 0.8 10*3/uL (ref 0.1–1.0)
Monocytes Relative: 5 %
Neutro Abs: 13.2 10*3/uL — ABNORMAL HIGH (ref 1.7–7.7)
Neutrophils Relative %: 76 %
Platelets: 406 10*3/uL — ABNORMAL HIGH (ref 150–400)
RBC: 2.59 MIL/uL — ABNORMAL LOW (ref 3.87–5.11)
RDW: 16.6 % — ABNORMAL HIGH (ref 11.5–15.5)
WBC: 17.1 10*3/uL — ABNORMAL HIGH (ref 4.0–10.5)
nRBC: 0.4 % — ABNORMAL HIGH (ref 0.0–0.2)

## 2019-07-30 LAB — MAGNESIUM: Magnesium: 2.3 mg/dL (ref 1.7–2.4)

## 2019-07-30 LAB — PHOSPHORUS: Phosphorus: 3.5 mg/dL (ref 2.5–4.6)

## 2019-07-30 MED ORDER — MELATONIN 5 MG PO TABS: 5.0000 mg | ORAL_TABLET | Freq: Once | ORAL | Status: DC

## 2019-07-30 NOTE — Care Management Important Message (Signed)
Important Message  Patient Details  Name: Kimberly Wang MRN: [id number] Date of Birth: [birth year]   Medicare Important Message Given:  Yes     [name] 07/30/2019, 11:59 AM

## 2019-07-30 NOTE — Progress Notes (Signed)
Hematology/Oncology Progress Note [facility] [phone] [phone]) [phone]  Patient Care Team: Valera Castle, MD as PCP - General (Family Medicine)   Name of the patient: Kimberly Wang  [id number]  01/18/1931  Date of visit: 07/30/19   INTERVAL HISTORY-  Patient was sitting in the chair.  She denies any complaints.She is eating lunch. Reports feeling well.     Review of systems- Review of Systems  Unable to perform ROS: Dementia    Allergies  Allergen Reactions  . Latex Hives and Rash  . Lidocaine Hcl Other (See Comments)    Other Reaction: Other reaction. Patient can't take Novacaine  . Codeine Other (See Comments)  . Morphine Other (See Comments)  . Novocain [Procaine]     Heart races  . Other Other (See Comments)    Uncoded Allergy. Allergen: RED PEPPER    Patient Active Problem List   Diagnosis Date Noted  . Sepsis due to urinary tract infection ([place])   . Anemia   . Sepsis due to gram-negative UTI ([place]) 07/22/2019  . Severe sepsis ([place]) 07/22/2019  . Dementia due to Parkinson's disease without behavioral disturbance ([place]) 07/22/2019  . Benign essential HTN 07/22/2019  . CAD (coronary artery disease) 07/22/2019  . Pain due to onychomycosis of toenails of both feet 05/17/2019  . Coagulation defect ([place]) 05/17/2019     Past Medical History:  Diagnosis Date  . Coronary artery disease   . Dementia ([place])   . Hypertension   . Myocardial infarct [facility])      Past Surgical History:  Procedure Laterality Date  . CHOLECYSTECTOMY    . CORONARY STENT PLACEMENT      Social History   Socioeconomic History  . Marital status: Married    Spouse name: Not on file  . Number of children: Not on file  . Years of education: Not on file  . Highest education level: Not on file  Occupational History  . Not on file  Tobacco Use  . Smoking status: Never Smoker  . Smokeless tobacco: Never Used  Substance and Sexual Activity   . Alcohol use: Never  . Drug use: Never  . Sexual activity: Not on file  Other Topics Concern  . Not on file  Social History Narrative  . Not on file   Social Determinants of Health   Financial Resource Strain:   . Difficulty of Paying Living Expenses:   Food Insecurity:   . Worried About Charity fundraiser in the Last Year:   . Arboriculturist in the Last Year:   Transportation Needs:   . Film/video editor (Medical):   Marland Kitchen Lack of Transportation (Non-Medical):   Physical Activity:   . Days of Exercise per Week:   . Minutes of Exercise per Session:   Stress:   . Feeling of Stress :   Social Connections:   . Frequency of Communication with Friends and Family:   . Frequency of Social Gatherings with Friends and Family:   . Attends Religious Services:   . Active Member of Clubs or Organizations:   . Attends Archivist Meetings:   Marland Kitchen Marital Status:   Intimate Partner Violence:   . Fear of Current or Ex-Partner:   . Emotionally Abused:   Marland Kitchen Physically Abused:   . Sexually Abused:      History reviewed. No pertinent family history.   Current Facility-Administered Medications:  .  acetaminophen (TYLENOL) tablet 650 mg, 650 mg, Oral, Q6H  PRN, 650 mg at 07/25/19 2125 **OR** acetaminophen (TYLENOL) suppository 650 mg, 650 mg, Rectal, Q6H PRN, Mansy, Jan A, MD .  atorvastatin (LIPITOR) tablet 80 mg, 80 mg, Oral, Daily, Mansy, Jan A, MD, 80 mg at 07/30/19 1018 .  diltiazem (CARDIZEM CD) 24 hr capsule 240 mg, 240 mg, Oral, Daily, Mansy, Jan A, MD, 240 mg at 07/30/19 1012 .  donepezil (ARICEPT) tablet 10 mg, 10 mg, Oral, QHS, Mansy, Jan A, MD, 10 mg at 07/29/19 2016 .  furosemide (LASIX) tablet 20 mg, 20 mg, Oral, Daily, Dalia Heading, MD, 20 mg at 07/30/19 1012 .  ipratropium-albuterol (DUONEB) 0.5-2.5 (3) MG/3ML nebulizer solution 3 mL, 3 mL, Nebulization, Q6H PRN, Jimmye Norman, NP, 3 mL at 07/29/19 1834 .  magnesium hydroxide (MILK OF MAGNESIA)  suspension 30 mL, 30 mL, Oral, Daily PRN, Mansy, Jan A, MD .  megestrol (MEGACE) 400 MG/10ML suspension 400 mg, 400 mg, Oral, Daily, Drema Dallas, MD, 400 mg at 07/30/19 1013 .  memantine (NAMENDA) tablet 10 mg, 10 mg, Oral, Daily, Mansy, Jan A, MD, 10 mg at 07/30/19 1012 .  multivitamin with minerals tablet 1 tablet, 1 tablet, Oral, Daily, Hall, Scott A, RPH, 1 tablet at 07/30/19 1012 .  nitroGLYCERIN (NITROSTAT) SL tablet 0.4 mg, 0.4 mg, Sublingual, Q5 min PRN, Mansy, Jan A, MD .  ondansetron (ZOFRAN) tablet 4 mg, 4 mg, Oral, Q6H PRN **OR** ondansetron (ZOFRAN) injection 4 mg, 4 mg, Intravenous, Q6H PRN, Mansy, Jan A, MD .  pantoprazole (PROTONIX) injection 40 mg, 40 mg, Intravenous, Q12H, Sheikh, Omair Latif, DO, 40 mg at 07/30/19 1011 .  potassium chloride SA (KLOR-CON) CR tablet 20 mEq, 20 mEq, Oral, Daily, Mansy, Jan A, MD, 20 mEq at 07/30/19 1011 .  QUEtiapine (SEROQUEL) tablet 25 mg, 25 mg, Oral, QHS PRN, Jimmye Norman, NP, 25 mg at 07/28/19 2015 .  traZODone (DESYREL) tablet 25 mg, 25 mg, Oral, QHS PRN, Mansy, Jan A, MD, 25 mg at 07/28/19 2015   Physical exam:  Vitals:   07/30/19 1100 07/30/19 1130 07/30/19 1200 07/30/19 1300  BP:  95/84    Pulse:  89    Resp: 19 18 (!) 25 20  Temp:      TempSrc:      SpO2:  100%    Weight:      Height:       Physical Exam  Constitutional: No distress.  HENT:  Head: Normocephalic and atraumatic.  Mouth/Throat: No oropharyngeal exudate.  Eyes: Pupils are equal, round, and reactive to light. EOM are normal. No scleral icterus.  Cardiovascular: Normal rate and regular rhythm.  No murmur heard. Pulmonary/Chest: Effort normal. No respiratory distress. She has no rales. She exhibits no tenderness.  Abdominal: Soft.  Musculoskeletal:        General: Normal range of motion.     Cervical back: Normal range of motion and neck supple.  Neurological: She is alert.  Skin: Skin is warm and dry. She is not diaphoretic. No erythema.   Psychiatric: Affect normal.       CMP Latest Ref Rng & Units 07/30/2019  Glucose 70 - 99 mg/dL 409(W)  BUN 8 - 23 mg/dL 18  Creatinine 1.19 - 1.47 mg/dL 8.29  Sodium 562 - 130 mmol/L 136  Potassium 3.5 - 5.1 mmol/L 4.6  Chloride 98 - 111 mmol/L 104  CO2 22 - 32 mmol/L 23  Calcium 8.9 - 10.3 mg/dL 8.6(V)  Total Protein 6.5 - 8.1 g/dL 7.8(I)  Total  Bilirubin 0.3 - 1.2 mg/dL 0.6  Alkaline Phos 38 - 126 U/L 72  AST 15 - 41 U/L 33  ALT 0 - 44 U/L 41   CBC Latest Ref Rng & Units 07/30/2019  WBC 4.0 - 10.5 K/uL 17.1(H)  Hemoglobin 12.0 - 15.0 g/dL 8.0(L)  Hematocrit 36.0 - 46.0 % 25.1(L)  Platelets 150 - 400 K/uL 406(H)    RADIOGRAPHIC STUDIES: I have personally reviewed the radiological images as listed and agreed with the findings in the report. CT ABDOMEN PELVIS WO CONTRAST  Result Date: 07/22/2019 CLINICAL DATA:  Sepsis EXAM: CT ABDOMEN AND PELVIS WITHOUT CONTRAST TECHNIQUE: Multidetector CT imaging of the abdomen and pelvis was performed following the standard protocol without IV contrast. COMPARISON:  None. FINDINGS: Lower chest: There is mild cardiomegaly. Mitral valve calcifications. A moderate paraesophageal hernia containing contrast is seen. Hepatobiliary: Although limited due to the lack of intravenous contrast, normal in appearance without gross focal abnormality. The patient is status post cholecystectomy. No biliary ductal dilation. Pancreas:  Unremarkable.  No surrounding inflammatory changes. Spleen: Normal in size. Although limited due to the lack of intravenous contrast, normal in appearance. Adrenals/Urinary Tract: Both adrenal glands appear normal. The kidneys and collecting system appear normal without evidence of urinary tract calculus or hydronephrosis. Bladder is unremarkable. Stomach/Bowel: The stomach, small bowel, are normal in appearance. There is scattered colonic diverticula without diverticulitis. Vascular/Lymphatic: There are no enlarged abdominal or pelvic  lymph nodes. Scattered aortic atherosclerotic calcifications are seen without aneurysmal dilatation. Reproductive: The uterus and adnexa are unremarkable. Other: A small fat and transverse colon containing umbilical hernia seen within the mid abdomen. Musculoskeletal: No acute or significant osseous findings. IMPRESSION: Moderate paraesophageal hernia. Diverticulosis without diverticulitis. No other acute intra-abdominal or pelvic pathology to explain the patient's symptoms. Aortic Atherosclerosis (ICD10-I70.0). Electronically Signed   By: Jonna Clark M.D.   On: 07/22/2019 01:40   DG Chest 1 View  Result Date: 07/27/2019 CLINICAL DATA:  Shortness of breath EXAM: CHEST  1 VIEW COMPARISON:  July 26, 2019 FINDINGS: There is unchanged mild cardiomegaly. There is aortic knob and descending intrathoracic aortic calcifications. There is slight interval improvement in the prominence of the central pulmonary vasculature. No acute osseous abnormality. IMPRESSION: Slight interval improvement in the pulmonary vascular congestion. Electronically Signed   By: Jonna Clark M.D.   On: 07/27/2019 06:04   DG Chest 1 View  Result Date: 07/26/2019 CLINICAL DATA:  Shortness of breath. EXAM: CHEST  1 VIEW COMPARISON:  07/23/2019. FINDINGS: Mediastinum hilar structures normal. Cardiomegaly. Mild bilateral interstitial prominence and small pleural effusions. CHF could present in this fashion. Sliding hiatal hernia cannot be excluded. IMPRESSION: Cardiomegaly. Mild bilateral interstitial prominence and small pleural effusions. CHF could present in this fashion. Electronically Signed   By: Maisie Fus  Register   On: 07/26/2019 07:52   DG Chest 1 View  Result Date: 07/23/2019 CLINICAL DATA:  Dyspnea EXAM: CHEST  1 VIEW COMPARISON:  Chest radiograph from one day prior. FINDINGS: Stable cardiomediastinal silhouette with mild cardiomegaly. No pneumothorax. Stable small left pleural effusion. No right pleural effusion. No overt pulmonary  edema. Streaky bibasilar lung opacities are similar IMPRESSION: Stable small left pleural effusion. Stable streaky bibasilar lung opacities, favor atelectasis or scarring. Stable mild cardiomegaly without overt pulmonary edema. Electronically Signed   By: Delbert Phenix M.D.   On: 07/23/2019 04:50   DG Chest 1 View  Result Date: 07/21/2019 CLINICAL DATA:  Sepsis. EXAM: CHEST  1 VIEW COMPARISON:  None. FINDINGS: There are  small bilateral pleural effusions. There is a left perihilar density of unknown clinical significance. The heart size is mildly enlarged. There are dense aortic calcifications. There are few streaky airspace opacities at the lung bases favored to represent areas of atelectasis. There is no pneumothorax. IMPRESSION: 1. Small bilateral pleural effusions. 2. Mild cardiomegaly. 3. Slightly prominent left perihilar region may be secondary to a dilated pulmonary artery or lymphadenopathy. A follow-up two-view chest x-ray is recommended in 4-6 weeks. Electronically Signed   By: Katherine Mantle M.D.   On: 07/21/2019 22:34   DG Chest Port 1 View  Result Date: 07/22/2019 CLINICAL DATA:  Shortness of breath. EXAM: PORTABLE CHEST 1 VIEW COMPARISON:  Jul 21, 2019 FINDINGS: Cardiomegaly. Skin fold over the lateral right chest. The hila and mediastinum are normal. No pneumothorax. No nodules or masses. No focal infiltrates. IMPRESSION: No active disease. Electronically Signed   By: Gerome Sam III M.D   On: 07/22/2019 15:12   ECHOCARDIOGRAM COMPLETE  Result Date: 07/27/2019    ECHOCARDIOGRAM REPORT   Patient Name:   MARYLYNNE KEELIN Date of Exam: 07/26/2019 Medical Rec #:  [id number]   Height:       64.0 in Accession #:    [id number]  Weight:       164.2 lb Date of Birth:  Sep 06, 1930    BSA:          1.799 m Patient Age:    86 years    BP:           117/65 mmHg Patient Gender: F           HR:           124 bpm. Exam Location:  [facility] Procedure: 2D Echo, Cardiac Doppler and Color Doppler Indications:      Dyspnea 786.09 / R06.00  History:         Patient has no prior history of Echocardiogram examinations.                  CAD; Risk Factors:Hypertension. MI.  Sonographer:     Neysa Bonito Roar Referring Phys:  [id number] Kateri Mc LATIF [facility] Diagnosing Phys: Lorine Bears MD IMPRESSIONS  1. Left ventricular ejection fraction, by estimation, is 40 to 45%. The left ventricle has mildly decreased function. The left ventricle demonstrates regional wall motion abnormalities with possible anterior/anteroseptal hypokinesis. There is mild left ventricular hypertrophy. Left ventricular diastolic parameters are indeterminate.  2. Right ventricular systolic function is normal. The right ventricular size is normal. There is mildly elevated pulmonary artery systolic pressure.  3. Left atrial size was severely dilated.  4. Right atrial size was moderately dilated.  5. The mitral valve is abnormal. Moderate mitral valve regurgitation. No evidence of mitral stenosis.  6. The aortic valve is abnormal. Aortic valve regurgitation is moderate. Mild aortic valve sclerosis is present, with no evidence of aortic valve stenosis. FINDINGS  Left Ventricle: Left ventricular ejection fraction, by estimation, is 40 to 45%. The left ventricle has mildly decreased function. The left ventricle demonstrates regional wall motion abnormalities. The left ventricular internal cavity size was normal in size. There is mild left ventricular hypertrophy. Left ventricular diastolic parameters are indeterminate. Right Ventricle: The right ventricular size is normal. No increase in right ventricular wall thickness. Right ventricular systolic function is normal. There is mildly elevated pulmonary artery systolic pressure. The tricuspid regurgitant velocity is 2.69  m/s, and with an assumed right atrial pressure of 10 mmHg, the estimated right ventricular systolic pressure is 38.9  mmHg. Left Atrium: Left atrial size was severely dilated. Right Atrium: Right atrial size  was moderately dilated. Pericardium: There is no evidence of pericardial effusion. Mitral Valve: The mitral valve is abnormal. There is moderate thickening of the mitral valve leaflet(s). There is mild calcification of the mitral valve leaflet(s). Normal mobility of the mitral valve leaflets. Moderate mitral annular calcification. Moderate mitral valve regurgitation. No evidence of mitral valve stenosis. Tricuspid Valve: The tricuspid valve is normal in structure. Tricuspid valve regurgitation is not demonstrated. No evidence of tricuspid stenosis. Aortic Valve: The aortic valve is abnormal. Aortic valve regurgitation is moderate. Mild aortic valve sclerosis is present, with no evidence of aortic valve stenosis. Aortic valve mean gradient measures 7.5 mmHg. Aortic valve peak gradient measures 14.1 mmHg. Aortic valve area, by VTI measures 1.73 cm. Pulmonic Valve: The pulmonic valve was normal in structure. Pulmonic valve regurgitation is not visualized. No evidence of pulmonic stenosis. Aorta: The aortic root is normal in size and structure. Venous: The inferior vena cava was not well visualized. IAS/Shunts: No atrial level shunt detected by color flow Doppler.  LEFT VENTRICLE PLAX 2D LVIDd:         4.47 cm  Diastology LVIDs:         3.12 cm  LV e' lateral:   6.64 cm/s LV PW:         1.14 cm  LV E/e' lateral: 23.8 LV IVS:        1.06 cm  LV e' medial:    8.81 cm/s LVOT diam:     1.70 cm  LV E/e' medial:  17.9 LV SV:         52 LV SV Index:   29 LVOT Area:     2.27 cm  RIGHT VENTRICLE RV Mid diam:    2.29 cm RV S prime:     12.70 cm/s TAPSE (M-mode): 1.6 cm LEFT ATRIUM             Index       RIGHT ATRIUM           Index LA diam:        4.20 cm 2.33 cm/m  RA Area:     19.00 cm LA Vol (A2C):   92.9 ml 51.64 ml/m RA Volume:   44.60 ml  24.79 ml/m LA Vol (A4C):   82.8 ml 46.02 ml/m LA Biplane Vol: 91.4 ml 50.80 ml/m  AORTIC VALVE                    PULMONIC VALVE AV Area (Vmax):    1.54 cm     PV Vmax:         1.10 m/s AV Area (Vmean):   1.56 cm     PV Peak grad:   4.8 mmHg AV Area (VTI):     1.73 cm     RVOT Peak grad: 2 mmHg AV Vmax:           187.50 cm/s AV Vmean:          123.000 cm/s AV VTI:            0.301 m AV Peak Grad:      14.1 mmHg AV Mean Grad:      7.5 mmHg LVOT Vmax:         127.00 cm/s LVOT Vmean:        84.500 cm/s LVOT VTI:          0.229 m LVOT/AV VTI ratio:  0.76  AORTA Ao Root diam: 2.20 cm MITRAL VALVE                TRICUSPID VALVE MV Area (PHT): 3.16 cm     TR Peak grad:   28.9 mmHg MV Decel Time: 240 msec     TR Vmax:        269.00 cm/s MV E velocity: 158.00 cm/s                             SHUNTS                             Systemic VTI:  0.23 m                             Systemic Diam: 1.70 cm Lorine Bears MD Electronically signed by Lorine Bears MD Signature Date/Time: 07/27/2019/8:13:34 AM    Final     Assessment and plan-   #Acute onset of anemia, Iron panel is not typical for iron deficiency.  Ferritin is 55. Normal vitamin B12 and folate level.-Check MMA-pending Reticulocyte panel shows normal reticulocyte hemoglobin, not typical for iron deficiency.  Patient has increased reticulocytosis which is usually seen in hemolysis or blood loss. LDH is normal.  Haptoglobin is pending.  Bilirubin is normal.  Less likely hemolysis. CT 07/22/2019 showed no retroperitoneal hematoma Hemoglobin trends up after Eliquis is stopped and increase of oral iron supplementation.   There is plan of endoscopy after she has been off anticoagulation for 2 days.   Leukocytosis, likely reactive secondary to UTI. Peripheral blood smear did not show any blast or fragmented RBCs.  Leukoerythroblastic changes. LDH is normal. If leukocytosis is persistently high after UTI resolves, will proceed with additional follow up . She can follow up with me outpatient.   Thank you for allowing me to participate in the care of this patient.   Rickard Patience, MD, PhD Hematology Oncology [facility] at  [facility] Pager- [phone] 07/30/2019

## 2019-07-30 NOTE — Progress Notes (Signed)
Patient Name: Kimberly Wang Date of Encounter: 07/30/2019  Hospital Problem List     Active Problems:   Coagulation defect (HCC)   Sepsis due to gram-negative UTI (HCC)   Severe sepsis (HCC)   Dementia due to Parkinson's disease without behavioral disturbance (HCC)   Benign essential HTN   CAD (coronary artery disease)   Anemia   Sepsis due to urinary tract infection [facility])    Patient Profile     86 year old female with history of HFpEF followed at [facility], history of coronary artery disease status post PCI approximately 15 years ago, history atrial fibrillation rate controlled and anticoagulated with Eliquis. Rate control with carvedilol and diltiazem admitted with UTI sepsis. She received a lot of fluid initially and developed volume overload. Has improved with careful diuresis. HGB has dropped. COncern over bleeding. Appreciated GI and hematology assistance.   Subjective   No complaints  Inpatient Medications    . atorvastatin  80 mg Oral Daily  . diltiazem  240 mg Oral Daily  . donepezil  10 mg Oral QHS  . furosemide  20 mg Oral Daily  . megestrol  400 mg Oral Daily  . memantine  10 mg Oral Daily  . multivitamin with minerals  1 tablet Oral Daily  . pantoprazole (PROTONIX) IV  40 mg Intravenous Q12H  . potassium chloride SA  20 mEq Oral Daily    Vital Signs    Vitals:   07/30/19 1100 07/30/19 1130 07/30/19 1200 07/30/19 1300  BP:  95/84    Pulse:  89    Resp: 19 18 (!) 25 20  Temp:      TempSrc:      SpO2:  100%    Weight:      Height:        Intake/Output Summary (Last 24 hours) at 07/30/2019 1343 Last data filed at 07/29/2019 1350 Gross per 24 hour  Intake 240 ml  Output --  Net 240 ml   Filed Weights   07/28/19 0452 07/29/19 0445 07/30/19 0422  Weight: 76.4 kg 76.5 kg 76.7 kg    Physical Exam    GEN: Well nourished, well developed, in no acute distress.  HEENT: normal.  Neck: Supple, no JVD, carotid bruits, or  masses. Cardiac: irr, irr Respiratory:  Respirations regular and unlabored, clear to auscultation bilaterally. GI: Soft, nontender, nondistended, BS + x 4. MS: no deformity or atrophy. Skin: warm and dry, no rash. Neuro:  Strength and sensation are intact. Psych: Normal affect.  Labs    CBC Recent Labs    07/29/19 0856 07/30/19 0700  WBC 15.9* 17.1*  NEUTROABS 12.4* 13.2*  HGB 7.7* 8.0*  HCT 24.2* 25.1*  MCV 95.7 96.9  PLT 325 406*   Basic Metabolic Panel Recent Labs    15/40/08 0856 07/30/19 0700  NA 135 136  K 4.5 4.6  CL 105 104  CO2 22 23  GLUCOSE 105* 119*  BUN 19 18  CREATININE 0.73 0.80  CALCIUM 8.6* 8.6*  MG 2.2 2.3  PHOS 3.3 3.5   Liver Function Tests Recent Labs    07/29/19 0856 07/30/19 0700  AST 31 33  ALT 41 41  ALKPHOS 69 72  BILITOT 0.8 0.6  PROT 5.5* 5.6*  ALBUMIN 2.7* 2.7*   No results for input(s): LIPASE, AMYLASE in the last 72 hours. Cardiac Enzymes No results for input(s): CKTOTAL, CKMB, CKMBINDEX, TROPONINI in the last 72 hours. BNP No results for input(s): BNP in the last  72 hours. D-Dimer No results for input(s): DDIMER in the last 72 hours. Hemoglobin A1C No results for input(s): HGBA1C in the last 72 hours. Fasting Lipid Panel No results for input(s): CHOL, HDL, LDLCALC, TRIG, CHOLHDL, LDLDIRECT in the last 72 hours. Thyroid Function Tests No results for input(s): TSH, T4TOTAL, T3FREE, THYROIDAB in the last 72 hours.  Invalid input(s): FREET3  Telemetry    afib with controlled vr this am  ECG    afib with rvr  Radiology    CT ABDOMEN PELVIS WO CONTRAST  Result Date: 07/22/2019 CLINICAL DATA:  Sepsis EXAM: CT ABDOMEN AND PELVIS WITHOUT CONTRAST TECHNIQUE: Multidetector CT imaging of the abdomen and pelvis was performed following the standard protocol without IV contrast. COMPARISON:  None. FINDINGS: Lower chest: There is mild cardiomegaly. Mitral valve calcifications. A moderate paraesophageal hernia containing  contrast is seen. Hepatobiliary: Although limited due to the lack of intravenous contrast, normal in appearance without gross focal abnormality. The patient is status post cholecystectomy. No biliary ductal dilation. Pancreas:  Unremarkable.  No surrounding inflammatory changes. Spleen: Normal in size. Although limited due to the lack of intravenous contrast, normal in appearance. Adrenals/Urinary Tract: Both adrenal glands appear normal. The kidneys and collecting system appear normal without evidence of urinary tract calculus or hydronephrosis. Bladder is unremarkable. Stomach/Bowel: The stomach, small bowel, are normal in appearance. There is scattered colonic diverticula without diverticulitis. Vascular/Lymphatic: There are no enlarged abdominal or pelvic lymph nodes. Scattered aortic atherosclerotic calcifications are seen without aneurysmal dilatation. Reproductive: The uterus and adnexa are unremarkable. Other: A small fat and transverse colon containing umbilical hernia seen within the mid abdomen. Musculoskeletal: No acute or significant osseous findings. IMPRESSION: Moderate paraesophageal hernia. Diverticulosis without diverticulitis. No other acute intra-abdominal or pelvic pathology to explain the patient's symptoms. Aortic Atherosclerosis (ICD10-I70.0). Electronically Signed   By: Jonna Clark M.D.   On: 07/22/2019 01:40   DG Chest 1 View  Result Date: 07/27/2019 CLINICAL DATA:  Shortness of breath EXAM: CHEST  1 VIEW COMPARISON:  July 26, 2019 FINDINGS: There is unchanged mild cardiomegaly. There is aortic knob and descending intrathoracic aortic calcifications. There is slight interval improvement in the prominence of the central pulmonary vasculature. No acute osseous abnormality. IMPRESSION: Slight interval improvement in the pulmonary vascular congestion. Electronically Signed   By: Jonna Clark M.D.   On: 07/27/2019 06:04   DG Chest 1 View  Result Date: 07/26/2019 CLINICAL DATA:  Shortness  of breath. EXAM: CHEST  1 VIEW COMPARISON:  07/23/2019. FINDINGS: Mediastinum hilar structures normal. Cardiomegaly. Mild bilateral interstitial prominence and small pleural effusions. CHF could present in this fashion. Sliding hiatal hernia cannot be excluded. IMPRESSION: Cardiomegaly. Mild bilateral interstitial prominence and small pleural effusions. CHF could present in this fashion. Electronically Signed   By: Maisie Fus  Register   On: 07/26/2019 07:52   DG Chest 1 View  Result Date: 07/23/2019 CLINICAL DATA:  Dyspnea EXAM: CHEST  1 VIEW COMPARISON:  Chest radiograph from one day prior. FINDINGS: Stable cardiomediastinal silhouette with mild cardiomegaly. No pneumothorax. Stable small left pleural effusion. No right pleural effusion. No overt pulmonary edema. Streaky bibasilar lung opacities are similar IMPRESSION: Stable small left pleural effusion. Stable streaky bibasilar lung opacities, favor atelectasis or scarring. Stable mild cardiomegaly without overt pulmonary edema. Electronically Signed   By: Delbert Phenix M.D.   On: 07/23/2019 04:50   DG Chest 1 View  Result Date: 07/21/2019 CLINICAL DATA:  Sepsis. EXAM: CHEST  1 VIEW COMPARISON:  None. FINDINGS: There are  small bilateral pleural effusions. There is a left perihilar density of unknown clinical significance. The heart size is mildly enlarged. There are dense aortic calcifications. There are few streaky airspace opacities at the lung bases favored to represent areas of atelectasis. There is no pneumothorax. IMPRESSION: 1. Small bilateral pleural effusions. 2. Mild cardiomegaly. 3. Slightly prominent left perihilar region may be secondary to a dilated pulmonary artery or lymphadenopathy. A follow-up two-view chest x-ray is recommended in 4-6 weeks. Electronically Signed   By: Katherine Mantle M.D.   On: 07/21/2019 22:34   DG Chest Port 1 View  Result Date: 07/22/2019 CLINICAL DATA:  Shortness of breath. EXAM: PORTABLE CHEST 1 VIEW COMPARISON:   Jul 21, 2019 FINDINGS: Cardiomegaly. Skin fold over the lateral right chest. The hila and mediastinum are normal. No pneumothorax. No nodules or masses. No focal infiltrates. IMPRESSION: No active disease. Electronically Signed   By: Gerome Sam III M.D   On: 07/22/2019 15:12   ECHOCARDIOGRAM COMPLETE  Result Date: 07/27/2019    ECHOCARDIOGRAM REPORT   Patient Name:   VANESS JELINSKI Date of Exam: 07/26/2019 Medical Rec #:  [id number]   Height:       64.0 in Accession #:    [id number]  Weight:       164.2 lb Date of Birth:  29-Jan-1931    BSA:          1.799 m Patient Age:    88 years    BP:           117/65 mmHg Patient Gender: F           HR:           124 bpm. Exam Location:  [facility] Procedure: 2D Echo, Cardiac Doppler and Color Doppler Indications:     Dyspnea 786.09 / R06.00  History:         Patient has no prior history of Echocardiogram examinations.                  CAD; Risk Factors:Hypertension. MI.  Sonographer:     Neysa Bonito Roar Referring Phys:  [id number] Kateri Mc LATIF [facility] Diagnosing Phys: Lorine Bears MD IMPRESSIONS  1. Left ventricular ejection fraction, by estimation, is 40 to 45%. The left ventricle has mildly decreased function. The left ventricle demonstrates regional wall motion abnormalities with possible anterior/anteroseptal hypokinesis. There is mild left ventricular hypertrophy. Left ventricular diastolic parameters are indeterminate.  2. Right ventricular systolic function is normal. The right ventricular size is normal. There is mildly elevated pulmonary artery systolic pressure.  3. Left atrial size was severely dilated.  4. Right atrial size was moderately dilated.  5. The mitral valve is abnormal. Moderate mitral valve regurgitation. No evidence of mitral stenosis.  6. The aortic valve is abnormal. Aortic valve regurgitation is moderate. Mild aortic valve sclerosis is present, with no evidence of aortic valve stenosis. FINDINGS  Left Ventricle: Left ventricular ejection fraction, by  estimation, is 40 to 45%. The left ventricle has mildly decreased function. The left ventricle demonstrates regional wall motion abnormalities. The left ventricular internal cavity size was normal in size. There is mild left ventricular hypertrophy. Left ventricular diastolic parameters are indeterminate. Right Ventricle: The right ventricular size is normal. No increase in right ventricular wall thickness. Right ventricular systolic function is normal. There is mildly elevated pulmonary artery systolic pressure. The tricuspid regurgitant velocity is 2.69  m/s, and with an assumed right atrial pressure of 10 mmHg, the estimated right ventricular systolic pressure is  38.9 mmHg. Left Atrium: Left atrial size was severely dilated. Right Atrium: Right atrial size was moderately dilated. Pericardium: There is no evidence of pericardial effusion. Mitral Valve: The mitral valve is abnormal. There is moderate thickening of the mitral valve leaflet(s). There is mild calcification of the mitral valve leaflet(s). Normal mobility of the mitral valve leaflets. Moderate mitral annular calcification. Moderate mitral valve regurgitation. No evidence of mitral valve stenosis. Tricuspid Valve: The tricuspid valve is normal in structure. Tricuspid valve regurgitation is not demonstrated. No evidence of tricuspid stenosis. Aortic Valve: The aortic valve is abnormal. Aortic valve regurgitation is moderate. Mild aortic valve sclerosis is present, with no evidence of aortic valve stenosis. Aortic valve mean gradient measures 7.5 mmHg. Aortic valve peak gradient measures 14.1 mmHg. Aortic valve area, by VTI measures 1.73 cm. Pulmonic Valve: The pulmonic valve was normal in structure. Pulmonic valve regurgitation is not visualized. No evidence of pulmonic stenosis. Aorta: The aortic root is normal in size and structure. Venous: The inferior vena cava was not well visualized. IAS/Shunts: No atrial level shunt detected by color flow Doppler.   LEFT VENTRICLE PLAX 2D LVIDd:         4.47 cm  Diastology LVIDs:         3.12 cm  LV e' lateral:   6.64 cm/s LV PW:         1.14 cm  LV E/e' lateral: 23.8 LV IVS:        1.06 cm  LV e' medial:    8.81 cm/s LVOT diam:     1.70 cm  LV E/e' medial:  17.9 LV SV:         52 LV SV Index:   29 LVOT Area:     2.27 cm  RIGHT VENTRICLE RV Mid diam:    2.29 cm RV S prime:     12.70 cm/s TAPSE (M-mode): 1.6 cm LEFT ATRIUM             Index       RIGHT ATRIUM           Index LA diam:        4.20 cm 2.33 cm/m  RA Area:     19.00 cm LA Vol (A2C):   92.9 ml 51.64 ml/m RA Volume:   44.60 ml  24.79 ml/m LA Vol (A4C):   82.8 ml 46.02 ml/m LA Biplane Vol: 91.4 ml 50.80 ml/m  AORTIC VALVE                    PULMONIC VALVE AV Area (Vmax):    1.54 cm     PV Vmax:        1.10 m/s AV Area (Vmean):   1.56 cm     PV Peak grad:   4.8 mmHg AV Area (VTI):     1.73 cm     RVOT Peak grad: 2 mmHg AV Vmax:           187.50 cm/s AV Vmean:          123.000 cm/s AV VTI:            0.301 m AV Peak Grad:      14.1 mmHg AV Mean Grad:      7.5 mmHg LVOT Vmax:         127.00 cm/s LVOT Vmean:        84.500 cm/s LVOT VTI:          0.229 m LVOT/AV VTI ratio:  0.76  AORTA Ao Root diam: 2.20 cm MITRAL VALVE                TRICUSPID VALVE MV Area (PHT): 3.16 cm     TR Peak grad:   28.9 mmHg MV Decel Time: 240 msec     TR Vmax:        269.00 cm/s MV E velocity: 158.00 cm/s                             SHUNTS                             Systemic VTI:  0.23 m                             Systemic Diam: 1.70 cm Lorine BearsMuhammad Arida MD Electronically signed by Lorine BearsMuhammad Arida MD Signature Date/Time: 07/27/2019/8:13:34 AM    Final     Assessment & Plan    86 y.o.femalewith history ofheart failure with preserved ejection fraction, chronic atrial fibrillation, coronary artery disease, dementia who is [facility] who was admitted with weakness and acute onset of confusion worse over her baseline.She had a cardiac MRI  in 2011 showing normal RV and LV function. There was a left atrial thrombus with no evidence of myocardial infarction. She had a non-STEMI in 2006 with cardiac cath revealing distal LAD disease not amenable to PCI. She had a PCI of the RCA with a Taxus stent. This was done in 2006 earlier. She has been on rate control with diltiazem for her A. fib. She has been on Eliquis for anticoagulation. She was diagnosed with both mixed Alzheimer's and vascular dementia in 2015. Her most recent outpatient regimen included apixaban 5 mg twice daily, atorvastatin 80 mg daily, carvedilol 6.25 mg twice daily, diltiazem 240 mg daily, furosemide 80 mg twice daily, spironolactone 25 mg daily along with potassium chloride to 20 mEq daily. She was last seen by her [facility] cardiology team approximately 1 year ago.  1. HFpEF-EF [facility]. Chest x-ray did not show pulmonary edema on admission. Fluid status appears to be over hydration. She does not appear acutely in congestive heart failure at present. Heart rate is controlled and she is oxygenating well. Would continue to carefully diurese following renal function and symptoms. BNP was mildly elevated.Echo done during this admission was read as showing an EF of 40 to 45% with moderate MR and AI.    2. Atrial fibrillation-rate appears well controlled at present. We will continue with diltiazem . Continue to hold eliquis due to anemia and possible active bleeding.   3. Sepsis-continue with current rx.   4. Dementia-has mixed vascular and Alzheimer's dementia. Per chart, appears to be getting near her baseline.  5. Coronary artery disease-status post PCI. This was done in 2006. No clinical evidence of ischemia. Patient denies chest pain at present. Not a candidate for invasive evaluation at present. Cardiac markers are unremarkable.  6. Hemoglobin is dropped which may be  secondary to hydration. I would resume eliquis 5 mg bid  Patient appears nearing baseline. Will evaluate the morning but consideration for discharge if improvement continues. Outpatient follow-up with Jeralene PetersLisa Povsic, [facility].    Signed, Darlin PriestlyKenneth A. Gerald Kuehl MD 07/30/2019, 1:43  PM  Pager: [phone]

## 2019-07-30 NOTE — Progress Notes (Signed)
PROGRESS NOTE    Kimberly Wang  JJK:[id number] DOB: [birth year] DOA: 07/22/2019 PCP: [name], MD   Brief Narrative:  HPI per Dr. Valente David on 07/22/2019 Kimberly Wang  is a 86 y.o. female with a known history of coronary artery disease, hypertension and dementia, who presented to the emergency room with acute onset of altered mental status with generalized weakness and confusion and significant diminish p.o. intake especially today.  She vomited once.  She was having low-grade fever without chills.  She denied any abdominal pain.  No significant dysuria, hematuria or urgency or frequency or flank pain.  No cough or wheezing or dyspnea.  Upon presentation to the emergency room, temperature was 100.2 and heart rate 101 with otherwise normal vital signs.  CMP revealed hyponatremia and hypokalemia as well as hypochloremia with blood glucose of 223, BUN of 18 and creatinine 1.14, albumin of 3.4.  Lactic acid was 3.8 and later 3.7.  High-sensitivity troponin I was 60.  CBC showed leukocytosis of 25.1 with neutrophilia.  Urinalysis showed 6-10 WBCs with positive nitrite and trace leukocytes.  Blood cultures were drawn and urine culture was sent.  Abdominal and pelvic CT scan revealed paraesophageal hernia with no acute findings.  Chest x-ray showed bilateral small pleural effusions with mild cardiomegaly.  The patient was given 1 g of p.o. Tylenol, a gram of IV cefepime and a gram of IV vancomycin, 1 L of lactated Ringer, 10 medical IV potassium chloride and 40 mEq p.o. as well as 1 L IV normal saline.  She will be admitted to the progressive unit bed for further evaluation and management.  **Interim History Remains pleasantly demented but will need PT OT to further evaluate and treat and now they are recommending SNF. Antibiotics have been changed to p.o.  She was wheezing a little bit her IV fluids have been stopped and she is given a dose of IV Lasix the day before yesterday and again yesterday  and will be given another one today. Cardiology was consulted for concern of CHF and Volume overload. She remains in Atrial Fibrillation but has had some faster heart rates.  Because of concern for GI bleed as her hemoglobin has dropped significantly along with an elevated BUN gastroenterology was consulted and they feel that the patient needs further hematological work-up to determine etiology of her anemia before proceeding with any endoscopic procedure.  Patient is unsure about having endoscopic procedure.  Fecal occult test has been pending.  Her Eliquis has been held for now.  Will need to continue monitor for signs and symptoms of bleeding.  Cardiology she still is not in overt heart failure recommending continuing to carefully diurese and following renal function and symptoms given that she is still 6.5 L positive.  Cardiology recommends discharging from their standpoint on current medications with Lipitor, diltiazem and K-Dur 20 milliequivalents and following up with Jonelle Sidle at [facility]  Hematology was consulted given her acute anemia and Dr. Cathie Hoops assisting and he recommends checking an MMA which is currently pending.  He is also recommended checking a haptoglobin which is also pending but he feels that hemolysis is less likely.  Because her hemoglobin continues to decrease further he recommends transfusion if it is below 7 and increasing will iron supplementation to twice daily.  He feels that we will need to rule out GI bleeding and cardiology recommending resuming Eliquis 5 mg twice daily as they do not feel that she is bleeding, however with her continuing drop in  hemoglobin I do not feel that this is secondary to hydration and will hold until we figure out the etiology of her blood count being lower   Assessment & Plan:   Active Problems:   Coagulation defect (HCC)   Sepsis due to gram-negative UTI (HCC)   Severe sepsis (HCC)   Dementia due to Parkinson's disease without behavioral disturbance  (HCC)   Benign essential HTN   CAD (coronary artery disease)   Anemia   Sepsis due to urinary tract infection (HCC)  Severe sepsis/ positive E. coli UTI -HR> 90, WBC> 12, lactic acid> 2 -Complete 5-day course of antibiotics -Stopped  Normal saline 3050ml/hr given her Wheezing and Dyspnea -Give 1x of IV Lasix yesterday and she has breathing treatments and given another dose today  -Repeat chest x-ray 07/27/2019 showed "Cardiomegaly and mild bilateral interstitial prominence and small pleural effusions" -6/2 change to oral antibiotics to po Keflex -PT/OT to further evaluate and Treat; OT done recommending home health OT still need PT evaluation if family cannot look after her 24/7 then they recommend SNF for increased safety and functional strengthening before returning home -WBC remained elevated since admission has been fluctuating and has now trending upwards again down from 14.2 -> 15.9 -> 17.1; she does have a Chronic Leukocytosis at baseline; otology feels that leukocytosis most likely secondary to her UTI and Dr. Cathie HoopsYu discussed with the pathologist Dr. Burgess Estellenley who looked at her smear and there is no blasts or fragmented RBCs.  There is reactive Leukoerythroblastic changes blood loss versus hemolysis; Dr. Cathie HoopsYu status is secondary to a UTI but if it persists even after her UTI has been treated we will proceed with additional follow-up in the outpatient setting -Patient procalcitonin level has been intermittently elevated and ranging from 0.68 and is trended down to 0.49 is now 0.54 -lactic acid level was 2.1 and went up to 4.1 is now down to 2.8  -repeat lactic acid level was 1.1 -[facility] and if she will go home she can likely go home in the next 24 to 48 hours versus SNF; PT/OT recommending SNF  AcuteMetabolic Encephalopathy superimposed on chronic dementia -Multifactorial to include severe sepsis, hyponatremia.  Son was able to give accurate baseline a few days ago to my  colleague  -C/w Neurochecks q 4 hrs -Aricept 10 mg daily -C/w Memantine 10 mg daily and continue with quetiapine 25 mg p.o. nightly as needed for agitation and sleep -Continue with delirium precautions -Minimize sedating medication  A. fib with RVR -Rates have been intermittently high and cardiology recommending continuing diltiazem and following -they recommending continue anticoagulation with Eliquis and continue telemetry monitoring -Eliquis had been held due to concern for GI bleed and hemoglobin continues to drop per cardiology resumed at this morning and I have now discontinued it again as GI is planning to scope the patient  days now and recommending EGD once her Eliquis has been held for 2 days; tomorrow she will get an endoscopy -Patient continues to be in A. fib with RVR intermittently and had a rates in the 130s today  Acute on Chronic Diastolic CHF -Mild in the setting of volume overload -BNP was elevated 125  -Chest x-ray did show a CHF pattern picture -Given IV Lasix this hospitalization has now stopped -cardiology does not feel that she is in acute heart failure -She is + 7.2373 L since admission and weight is up 8 pounds  -She was given IV Lasix 20 milligrams 3 days  in a row and has been held for the last few days -We will defer to cardiology for further Lasix usage -Continue to monitor for signs and symptoms of volume overload  Hyponatremia, this is improved -Multifactorial volume depletion, SSRI, anorexia. -Correct underlying factors -IVF now stopped and will be given a dose of IV Lasix 20 mg for the last 3 days but will hold yesterday; Defer further Lasix to Cardiology -Zoloft 50 mg daily (hold) -Megace 400 mg daily -Sodium is now 136  Elevated troponin high-sensitivity/demand ischemia She has a history of CAD status post PCI done in 2006 -Troponin was 60 and repeat was 64 -Not consistent with ACS.  Most consistent with demand ischemia. -Not complaining of any  Chest Pain -Continue with apixaban 5 mg p.o. twice daily once we know that she is not bleeding -Continue atorvastatin 80 mg p.o. daily, and nitroglycerin 0.4 mg sublingual every 5 minutes as needed chest pain -Currently does not have any chest pain cardiac markers are unremarkable  Essential HTN -C/w Cardizem 240 mg daily  Dyslipidemia -C/w Lipitor 80 mg daily -Lipid panel done showed a total cholesterol/HDL ratio of 4.2, cholesterol of 172, HDL level of 17, LDL 38, triglycerides of 86 and VLDL of 17  Coagulation defect -Currently was holding Apixaban 5 mg BID as there is concern for GIB and will need to figure out if she is and then if she is not we will need to resume her apixaban when able cardiology had resumed the morning of 07/28/2019 and she did receive a dose.  I have discontinued it again given that she will be going for an endoscopy in the morning  Elevated BUN -Patient is BUN peaked at 50 and is now improved and is 19 -check FOBT to rule out Upper GI bleeding as her hemoglobin has been slowly dropping further and still has not been done -Currently not on steroids -continue to monitor carefully and repeat CMP in a.m. -Gastroenterology is recommending EGD  Normocytic Anemia -Patient hemoglobin/hematocrit has been slowly dropping and went from 10.6/31.8 down to 9.2/28.3 and has further declined to 7.4/23.4 yesterday and today it is slightly better at 8.0/25.1 -Checked anemia panel and showed iron level of 30, U IBC of 233, TIBC of 263, saturation ratio is 11%, ferritin level 55, folate level 14.8, vitamin B12 level 440 -Check FOBT to rule out GI bleeding and this is still pending and still not been done no evidence of any active GI bleeding currently though -Anticoagulated with apixaban but will currently hold for now and resume as soon as we can at the recommendations of cardiology -continue monitor for signs and symptoms bleeding; currently no overt bleeding noted -Consulted  Gastroenterology for anemia work-up and they are deferring the endoscopic procedures for now and want a hematological work-up -Hematology Dr. Cathie Hoops has been consulted and he feels that the iron panel is not typical for iron deficiency.  He has recommended obtaining a reticulocyte panel and obtain LDH and haptoglobin.  LDH was normal.  Dr. Cathie Hoops and I discussed and he feels Increased reticulocytosis can be seen in acute GI bleeding or hemolysis with patient's T bili is normal less likely hemolysis but we are still checking LDH and haptoglobin (Pending) -Appreciate Heme-Onc as well as GI evaluation; GI planning on doing a endoscopy now however she had been off of anticoagulation for 2 days almost and then cardiology resumed her Eliquis yesterday morning and her last dose of Eliquis was on 07/28/2019 and she received a dose and  I have now discontinued it again -Her hemoglobin is now trending upwards after her Eliquis has been held and she did get some oral iron but I have stopped this at the recommendation of GI. -Repeat CBC with differential in a.m. and also continue daily  Hypokalemia -Goal of potassium level> 4 -Potassium today is 4.6 -continue to monitor and replete as necessary -Repeat CMP in a.m.  Hypomagnesemia -Mag Level was 2.3 today -Continue to monitor replete as necessary -Repeat CBC in a.m.  Hypophosphatemia -Goal> 2.5 -Patient's phosphorus is now 3.5 -Continue to monitor and treat as necessary  DVT prophylaxis: SCDs; Anticoagulated with Apxiaban but have held it for now given her continued drop in Hgb and will need to be off of it for 2 days for possible EGD Code Status: FULL CODE Family Communication: No family present at bedside  Disposition Plan: Further evaluation by PT OT for safe discharge disposition and they are recommending SNF.  Her antibiotics have been now changed to p.o. cardiology is consulted given her concern for volume overload and CHF; because there is concern of her  having a drop in hemoglobin GI and hematology were both consulted.  GI is planning on doing EGD in the a.m.  Status is: Inpatient  Remains inpatient appropriate because:Ongoing diagnostic testing needed not appropriate for outpatient work up, Unsafe d/c plan and Inpatient level of care appropriate due to severity of illness   Dispo: The patient is from: Home              Anticipated d/c is to: TBD; SNF vs. Home Health PT/OT              Anticipated d/c date is: 1 day              Patient currently is not medically stable to d/c.  Consultants:   Cardiology  Gastroenterology  Hematology    Procedures: None  Antimicrobials:  Anti-infectives (From admission, onward)   Start     Dose/Rate Route Frequency Ordered Stop   07/26/19 0600  cephALEXin (KEFLEX) capsule 500 mg     500 mg Oral Every 6 hours 07/25/19 1718 07/29/19 0559   07/22/19 0900  cefTRIAXone (ROCEPHIN) 1 g in sodium chloride 0.9 % 100 mL IVPB  Status:  Discontinued     1 g 200 mL/hr over 30 Minutes Intravenous Every 24 hours 07/22/19 0248 07/25/19 1714   07/22/19 0145  vancomycin (VANCOCIN) IVPB 1000 mg/200 mL premix     1,000 mg 200 mL/hr over 60 Minutes Intravenous  Once 07/22/19 0144 07/22/19 0349   07/22/19 0045  ceFEPIme (MAXIPIME) 1 g in sodium chloride 0.9 % 100 mL IVPB     1 g 200 mL/hr over 30 Minutes Intravenous  Once 07/22/19 0039 07/22/19 0202     Subjective: Seen and examined at bedside and she is sitting in a chair bedside again watching television but she states that she wants to get out of the hospital and is feeling frustrated.  No nausea or vomiting.  Denies any lightheadedness or dizziness.  Feels okay and denies any current bleeding but does not know if she had any blood in her stool as she did not check.  Objective: Vitals:   07/30/19 1200 07/30/19 1300 07/30/19 1400 07/30/19 1500  BP:      Pulse:      Resp: (!) Temp:      TempSrc:      SpO2:      Weight:  Height:         Intake/Output Summary (Last 24 hours) at 07/30/2019 1508 Last data filed at 07/30/2019 1354 Gross per 24 hour  Intake 240 ml  Output --  Net 240 ml   Filed Weights   07/28/19 0452 07/29/19 0445 07/30/19 0422  Weight: 76.4 kg 76.5 kg 76.7 kg   Examination: Physical Exam:  Constitutional: WN/WD Caucasian female currently in no acute distress and in a chair bedside and she is a little frustrated today., NAD and appears calm and comfortable Eyes: Lids and conjunctivae normal, sclerae anicteric  ENMT: External Ears, Nose appear normal. Grossly normal hearing Neck: Appears normal, supple, no cervical masses, normal ROM, no appreciable thyromegaly; no JVD Respiratory: Diminished to auscultation bilaterally, no wheezing, rales, rhonchi or crackles. Normal respiratory effort and patient is not tachypenic. No accessory muscle use.  Unlabored breathing Cardiovascular: Irregularly irregular and was tachycardic this morning, has a 2 out of 6 systolic murmur has trace to 1+ extremity edema.  Abdomen: Soft, non-tender, distended secondary body habitus. Bowel sounds positive.  GU: Deferred. Musculoskeletal: No clubbing / cyanosis of digits/nails. No joint deformity upper and lower extremities.  Skin: No rashes, lesions, ulcers on limited skin evaluation. No induration; Warm and dry.  Neurologic: CN 2-12 grossly intact with no focal deficits. Romberg sign cerebellar reflexes not assessed.  Psychiatric: Slightly impaired judgment and insight. Alert and oriented x 2. Normal mood and appropriate affect.   Data Reviewed: I have personally reviewed following labs and imaging studies  CBC: Recent Labs  Lab 07/27/19 0447 07/28/19 0454 07/29/19 0731 07/29/19 0856 07/30/19 0700  WBC 16.6* 18.5* 14.2* 15.9* 17.1*  NEUTROABS 13.0* 14.4* 11.1* 12.4* 13.2*  HGB 7.8* 7.4* 7.3* 7.7* 8.0*  HCT 24.2* 23.4* 23.2* 24.2* 25.1*  MCV 96.4 98.3 97.9 95.7 96.9  PLT 232 269 292 325 406*   Basic Metabolic  Panel: Recent Labs  Lab 07/26/19 0443 07/26/19 0443 07/27/19 0447 07/28/19 0454 07/29/19 0731 07/29/19 0856 07/30/19 0700  NA 138  --  137 137  --  135 136  K 4.8  --  4.3 4.6  --  4.5 4.6  CL 107  --  105 108  --  105 104  CO2 25  --  24 24  --  22 23  GLUCOSE 133*  --  134* 118*  --  105* 119*  BUN 50*  --  34* 23  --  19 18  CREATININE 0.66  --  0.80 0.82  --  0.73 0.80  CALCIUM 8.5*  --  8.6* 8.6*  --  8.6* 8.6*  MG 2.0   < > 2.1 2.1 2.3 2.2 2.3  PHOS 2.7   < > 2.9 3.3 3.3 3.3 3.5   < > = values in this interval not displayed.   GFR: Estimated Creatinine Clearance: 50.6 mL/min (by C-G formula based on SCr of 0.8 mg/dL). Liver Function Tests: Recent Labs  Lab 07/26/19 0443 07/27/19 0447 07/28/19 0454 07/29/19 0856 07/30/19 0700  AST 26 27 36 31 33  ALT 30 30 42 41 41  ALKPHOS 60 63 69 69 72  BILITOT 0.4 0.7 0.5 0.8 0.6  PROT 4.9* 5.0* 5.2* 5.5* 5.6*  ALBUMIN 2.3* 2.3* 2.4* 2.7* 2.7*   No results for input(s): LIPASE, AMYLASE in the last 168 hours. No results for input(s): AMMONIA in the last 168 hours. Coagulation Profile: Recent Labs  Lab 07/27/19 1531  INR 1.5*   Cardiac Enzymes: No results for input(s): CKTOTAL, CKMB,  CKMBINDEX, TROPONINI in the last 168 hours. BNP (last 3 results) No results for input(s): PROBNP in the last 8760 hours. HbA1C: No results for input(s): HGBA1C in the last 72 hours. CBG: No results for input(s): GLUCAP in the last 168 hours. Lipid Profile: No results for input(s): CHOL, HDL, LDLCALC, TRIG, CHOLHDL, LDLDIRECT in the last 72 hours. Thyroid Function Tests: No results for input(s): TSH, T4TOTAL, FREET4, T3FREE, THYROIDAB in the last 72 hours. Anemia Panel: Recent Labs    07/27/19 1707  RETICCTPCT 3.8*   Sepsis Labs: Recent Labs  Lab 07/23/19 1638 07/24/19 0528 07/25/19 0527 07/26/19 0443  PROCALCITON  --  0.49 0.54  --   LATICACIDVEN 2.8*  --   --  1.0    Recent Results (from the past 240 hour(s))  Culture,  blood (Routine x 2)     Status: None   Collection Time: 07/21/19 10:07 PM   Specimen: BLOOD  Result Value Ref Range Status   Specimen Description BLOOD LEFT ANTECUBITAL  Final   Special Requests   Final    BOTTLES DRAWN AEROBIC AND ANAEROBIC Blood Culture results may not be optimal due to an excessive volume of blood received in culture bottles   Culture   Final    NO GROWTH 5 DAYS Performed at [facility], [address] [place], [place] [address]    Report Status 07/26/2019 FINAL  Final  Culture, blood (Routine x 2)     Status: None   Collection Time: 07/21/19 10:51 PM   Specimen: BLOOD  Result Value Ref Range Status   Specimen Description BLOOD BLOOD RIGHT ARM  Final   Special Requests   Final    BOTTLES DRAWN AEROBIC AND ANAEROBIC Blood Culture adequate volume   Culture   Final    NO GROWTH 5 DAYS Performed at [facility], [address] [place], [place] [address]    Report Status 07/26/2019 FINAL  Final  SARS Coronavirus 2 by RT PCR (hospital order, performed in [place] hospital lab) Nasopharyngeal Nasopharyngeal Swab     Status: None   Collection Time: 07/22/19 12:34 AM   Specimen: Nasopharyngeal Swab  Result Value Ref Range Status   SARS Coronavirus 2 NEGATIVE NEGATIVE Final    Comment: (NOTE) SARS-CoV-2 target nucleic acids are NOT DETECTED. The SARS-CoV-2 RNA is generally detectable in upper and lower respiratory specimens during the acute phase of infection. The lowest concentration of SARS-CoV-2 viral copies this assay can detect is 250 copies / mL. A negative result does not preclude SARS-CoV-2 infection and should not be used as the sole basis for treatment or other patient management decisions.  A negative result may occur with improper specimen collection / handling, submission of specimen other than nasopharyngeal swab, presence of viral mutation(s) within the areas targeted by this assay, and inadequate number of viral copies (<250  copies / mL). A negative result must be combined with clinical observations, patient history, and epidemiological information. Fact Sheet for Patients:   StrictlyIdeas.no Fact Sheet for Healthcare Providers: BankingDealers.co.za This test is not yet approved or cleared  by the [place] FDA and has been authorized for detection and/or diagnosis of SARS-CoV-2 by FDA under an Emergency Use Authorization (EUA).  This EUA will remain in effect (meaning this test can be used) for the duration of the COVID-19 declaration under Section 564(b)(1) of the Act, 21 U.S.C. section 360bbb-3(b)(1), unless the authorization is terminated or revoked sooner. Performed at [facility], [place],  [place], [place] [address]   Urine Culture     Status: Abnormal   Collection Time: 07/22/19 12:54 AM   Specimen: Urine, Random  Result Value Ref Range Status   Specimen Description   Final    URINE, RANDOM Performed at [facility], [address] [place], [place] [address]    Special Requests   Final    NONE Performed at [facility], [address] [place], [place] [address]    Culture >=100,000 COLONIES/mL ESCHERICHIA COLI (A)  Final   Report Status 07/25/2019 FINAL  Final   Organism ID, Bacteria ESCHERICHIA COLI (A)  Final      Susceptibility   Escherichia coli - MIC*    AMPICILLIN >=32 RESISTANT Resistant     CEFAZOLIN <=4 SENSITIVE Sensitive     CEFTRIAXONE <=1 SENSITIVE Sensitive     CIPROFLOXACIN 0.5 SENSITIVE Sensitive     GENTAMICIN <=1 SENSITIVE Sensitive     IMIPENEM <=0.25 SENSITIVE Sensitive     NITROFURANTOIN <=16 SENSITIVE Sensitive     TRIMETH/SULFA >=320 RESISTANT Resistant     AMPICILLIN/SULBACTAM 4 SENSITIVE Sensitive     PIP/TAZO <=4 SENSITIVE Sensitive     * >=100,000 COLONIES/mL ESCHERICHIA COLI     RN Pressure Injury Documentation:     Estimated body mass index is 29.02 kg/m as  calculated from the following:   Height as of this encounter: 5\' 4"  (1.626 m).   Weight as of this encounter: 76.7 kg.  Malnutrition Type:      Malnutrition Characteristics:      Nutrition Interventions:     Radiology Studies: No results found.  Scheduled Meds: . atorvastatin  80 mg Oral Daily  . diltiazem  240 mg Oral Daily  . donepezil  10 mg Oral QHS  . furosemide  20 mg Oral Daily  . megestrol  400 mg Oral Daily  . memantine  10 mg Oral Daily  . multivitamin with minerals  1 tablet Oral Daily  . pantoprazole (PROTONIX) IV  40 mg Intravenous Q12H  . potassium chloride SA  20 mEq Oral Daily   Continuous Infusions:   LOS: 8 days   , DO [facility] PAGER is on AMION  If 7PM-7AM, please contact night-coverage [email or web address]

## 2019-07-31 ENCOUNTER — Encounter: Payer: Self-pay | Admitting: Family Medicine

## 2019-07-31 ENCOUNTER — Encounter
Admission: EM | Disposition: A | Discharge status: Skilled Nursing Facility | Payer: Self-pay | Source: Home / Self Care | Attending: Internal Medicine

## 2019-07-31 ENCOUNTER — Inpatient Hospital Stay: Payer: Medicare PPO | Admitting: Anesthesiology

## 2019-07-31 ENCOUNTER — Other Ambulatory Visit: Payer: Self-pay

## 2019-07-31 DIAGNOSIS — K449 Diaphragmatic hernia without obstruction or gangrene: Secondary | ICD-10-CM

## 2019-07-31 DIAGNOSIS — D509 Iron deficiency anemia, unspecified: Secondary | ICD-10-CM

## 2019-07-31 HISTORY — PX: ESOPHAGOGASTRODUODENOSCOPY (EGD) WITH PROPOFOL: SHX5813

## 2019-07-31 LAB — CBC WITH DIFFERENTIAL/PLATELET
Abs Immature Granulocytes: 0.23 10*3/uL — ABNORMAL HIGH (ref 0.00–0.07)
Basophils Absolute: 0 10*3/uL (ref 0.0–0.1)
Basophils Relative: 0 %
Eosinophils Absolute: 0.1 10*3/uL (ref 0.0–0.5)
Eosinophils Relative: 1 %
HCT: 22.4 % — ABNORMAL LOW (ref 36.0–46.0)
Hemoglobin: 7.2 g/dL — ABNORMAL LOW (ref 12.0–15.0)
Immature Granulocytes: 2 %
Lymphocytes Relative: 13 %
Lymphs Abs: 1.6 10*3/uL (ref 0.7–4.0)
MCH: 30.5 pg (ref 26.0–34.0)
MCHC: 32.1 g/dL (ref 30.0–36.0)
MCV: 94.9 fL (ref 80.0–100.0)
Monocytes Absolute: 0.7 10*3/uL (ref 0.1–1.0)
Monocytes Relative: 6 %
Neutro Abs: 9.8 10*3/uL — ABNORMAL HIGH (ref 1.7–7.7)
Neutrophils Relative %: 78 %
Platelets: 329 10*3/uL (ref 150–400)
RBC: 2.36 MIL/uL — ABNORMAL LOW (ref 3.87–5.11)
RDW: 16.6 % — ABNORMAL HIGH (ref 11.5–15.5)
WBC: 12.5 10*3/uL — ABNORMAL HIGH (ref 4.0–10.5)
nRBC: 0.4 % — ABNORMAL HIGH (ref 0.0–0.2)

## 2019-07-31 LAB — HAPTOGLOBIN: Haptoglobin: 213 mg/dL (ref 41–333)

## 2019-07-31 LAB — MAGNESIUM: Magnesium: 2.4 mg/dL (ref 1.7–2.4)

## 2019-07-31 LAB — GLUCOSE, CAPILLARY: Glucose-Capillary: 135 mg/dL — ABNORMAL HIGH (ref 70–99)

## 2019-07-31 LAB — PHOSPHORUS: Phosphorus: 3.6 mg/dL (ref 2.5–4.6)

## 2019-07-31 SURGERY — ESOPHAGOGASTRODUODENOSCOPY (EGD) WITH PROPOFOL
Anesthesia: General

## 2019-07-31 MED ORDER — SODIUM CHLORIDE 0.9 % IV SOLN: INTRAVENOUS | Status: DC

## 2019-07-31 MED ORDER — PROPOFOL 10 MG/ML IV BOLUS
INTRAVENOUS | Status: AC
  Filled 2019-07-31: qty 20

## 2019-07-31 MED ORDER — MAGNESIUM CITRATE PO SOLN
1.0000 | Freq: Once | ORAL | Status: AC
  Administered 2019-07-31: 1 via ORAL
  Filled 2019-07-31: qty 296

## 2019-07-31 MED ORDER — PEG 3350-KCL-NA BICARB-NACL 420 G PO SOLR
4000.0000 mL | Freq: Once | ORAL | Status: AC
  Administered 2019-07-31: 4000 mL via ORAL
  Filled 2019-07-31: qty 4000

## 2019-07-31 MED ORDER — PROPOFOL 10 MG/ML IV BOLUS
INTRAVENOUS | Status: DC | PRN
  Administered 2019-07-31: 30 mg via INTRAVENOUS
  Administered 2019-07-31: 50 mg via INTRAVENOUS
  Administered 2019-07-31: 20 mg via INTRAVENOUS

## 2019-07-31 NOTE — Transfer of Care (Signed)
Immediate Anesthesia Transfer of Care Note  Patient: Kimberly Wang  Procedure(s) Performed: ESOPHAGOGASTRODUODENOSCOPY (EGD) WITH PROPOFOL (N/A )  Patient Location: PACU and Endoscopy Unit  Anesthesia Type:General  Level of Consciousness: drowsy  Airway & Oxygen Therapy: Patient Spontanous Breathing  Post-op Assessment: Report given to RN  Post vital signs: stable  Last Vitals:  Vitals Value Taken Time  BP    Temp    Pulse    Resp    SpO2      Last Pain:  Vitals:   07/31/19 1001  TempSrc: Temporal  PainSc: 0-No pain         Complications: No apparent anesthesia complications

## 2019-07-31 NOTE — Progress Notes (Addendum)
Pt continuous to be confused about family coming to pick her up. Pleasantly confused and easy to reorient. Low bed and mats in place. PT recommends SNF. Unsure if Pt will return home with home health or SNF.

## 2019-07-31 NOTE — Anesthesia Preprocedure Evaluation (Signed)
Anesthesia Evaluation  Patient identified by MRN, date of birth, ID band Patient awake    Reviewed: Allergy & Precautions, H&P , NPO status , Patient's Chart, lab work & pertinent test results, reviewed documented beta blocker date and time   History of Anesthesia Complications Negative for: history of anesthetic complications  Airway Mallampati: III  TM Distance: >3 FB Neck ROM: full    Dental  (+) Caps, Dental Advidsory Given, Poor Dentition   Pulmonary neg pulmonary ROS,    Pulmonary exam normal breath sounds clear to auscultation       Cardiovascular Exercise Tolerance: Good hypertension, (-) angina+ CAD, + Past MI and + Cardiac Stents  (-) CABG Normal cardiovascular exam(-) dysrhythmias (-) Valvular Problems/Murmurs Rhythm:regular Rate:Normal     Neuro/Psych neg Seizures PSYCHIATRIC DISORDERS Dementia CVA, No Residual Symptoms    GI/Hepatic negative GI ROS, Neg liver ROS,   Endo/Other  negative endocrine ROS  Renal/GU negative Renal ROS  negative genitourinary   Musculoskeletal   Abdominal   Peds  Hematology negative hematology ROS (+)   Anesthesia Other Findings Past Medical History: No date: Coronary artery disease No date: Dementia (HCC) No date: Hypertension No date: Myocardial infarct (HCC)   Reproductive/Obstetrics negative OB ROS                             Anesthesia Physical Anesthesia Plan  ASA: III  Anesthesia Plan: General   Post-op Pain Management:    Induction: Intravenous  PONV Risk Score and Plan: 3 and Propofol infusion and TIVA  Airway Management Planned: Natural Airway and Nasal Cannula  Additional Equipment:   Intra-op Plan:   Post-operative Plan:   Informed Consent: I have reviewed the patients History and Physical, chart, labs and discussed the procedure including the risks, benefits and alternatives for the proposed anesthesia with the patient  or authorized representative who has indicated his/her understanding and acceptance.     Dental Advisory Given  Plan Discussed with: Anesthesiologist, CRNA and Surgeon  Anesthesia Plan Comments:         Anesthesia Quick Evaluation

## 2019-07-31 NOTE — Anesthesia Postprocedure Evaluation (Signed)
Anesthesia Post Note  Patient: Kimberly Wang) Performed: ESOPHAGOGASTRODUODENOSCOPY (EGD) WITH PROPOFOL (N/A )  Patient location during evaluation: PACU Anesthesia Type: General Level of consciousness: awake and alert Pain management: pain level controlled Vital Signs Assessment: post-procedure vital signs reviewed and stable Respiratory status: spontaneous breathing, nonlabored ventilation, respiratory function stable and patient connected to nasal cannula oxygen Cardiovascular status: blood pressure returned to baseline and stable Postop Assessment: no apparent nausea or vomiting Anesthetic complications: no     Last Vitals:  Vitals:   07/31/19 1153 07/31/19 1258  BP: (!) 137/54 (!) 130/46  Pulse: 88 (!) 54  Resp: 15 20  Temp:  36.6 C  SpO2: 97% 100%    Last Pain:  Vitals:   07/31/19 1153  TempSrc:   PainSc: 0-No pain                 Lenard Simmer

## 2019-07-31 NOTE — Progress Notes (Signed)
PROGRESS NOTE    Kimberly Wang  AOZ:[id number] DOB: [birth year] DOA: 07/22/2019 PCP: Kimberly Housekeeper, MD   Brief Narrative:  HPI On 07/22/2019 by Dr. Valente David Kimberly Wang a88 y.o.femalewith a known history of coronary artery disease, hypertension and dementia, who presented to the emergency room with acute onset of altered mental status with generalized weakness and confusion and significant diminish p.o. intake especially today. She vomited once. She was having low-grade fever without chills. She denied any abdominal pain. No significant dysuria, hematuria or urgency or frequency or flank pain. No cough or wheezing or dyspnea.  Upon presentation to the emergency room, temperature was 100.2 and heart rate 101 with otherwise normal vital signs. CMP revealed hyponatremia and hypokalemia as well as hypochloremia with blood glucose of 223, BUN of 18 and creatinine 1.14, albumin of 3.4. Lactic acid was 3.8 and later 3.7. High-sensitivity troponin I was 60. CBC showed leukocytosis of 25.1 with neutrophilia. Urinalysis showed 6-10 WBCs with positive nitrite and trace leukocytes. Blood cultures were drawn and urine culture was sent. Abdominal and pelvic CT scan revealed paraesophageal hernia with no acute findings. Chest x-ray showed bilateral small pleural effusions with mild cardiomegaly.  The patient was given 1 g of p.o. Tylenol, a gram of IV cefepime and a gram of IV vancomycin, 1 L of lactated Ringer, 10 medical IV potassium chloride and 40 mEq p.o. as well as 1 L IV normal saline. She will be admitted to the progressive unit bed for further evaluation and management.  Interim history  Remains pleasantly demented but will need PT OT to further evaluate and treat and now they are recommending SNF. Antibiotics have been changed to p.o.  She was wheezing a little bit her IV fluids have been stopped and she is given a dose of IV Lasix the day before yesterday and again yesterday and  will be given another one today. Cardiology was consulted for concern of CHF and Volume overload. She remains in Atrial Fibrillation but has had some faster heart rates.  Because of concern for GI bleed as her hemoglobin has dropped significantly along with an elevated BUN gastroenterology was consulted and they feel that the patient needs further hematological work-up to determine etiology of her anemia before proceeding with any endoscopic procedure.  Patient is unsure about having endoscopic procedure.  Fecal occult test has been pending.  Her Eliquis has been held for now.  Will need to continue monitor for signs and symptoms of bleeding.  Cardiology she still is not in overt heart failure recommending continuing to carefully diurese and following renal function and symptoms given that she is still 6.5 L positive.  Cardiology recommends discharging from their standpoint on current medications with Lipitor, diltiazem and K-Dur 20 milliequivalents and following up with Jonelle Sidle at [facility]  Hematology was consulted given her acute anemia and Dr. Cathie Hoops assisting and he recommends checking an MMA which is currently pending.  He is also recommended checking a haptoglobin which is also pending but he feels that hemolysis is less likely.  Because her hemoglobin continues to decrease further he recommends transfusion if it is below 7 and increasing will iron supplementation to twice daily.  He feels that we will need to rule out GI bleeding and cardiology recommending resuming Eliquis 5 mg twice daily as they do not feel that she is bleeding, however with her continuing drop in hemoglobin I do not feel that this is secondary to hydration and will hold until we figure out the  etiology of her blood count being lower   Assessment & Plan   Severe sepsis secondary to E. coli urinary tract infection -Present on admission.  Patient with leukocytosis, tachycardia, tachypnea, elevated lactic acid AMS. -Patient is completed  antibiotic course -She was on IV saline which has been discontinued given that she had dyspnea and wheezing.  She was then given 1 dose of IV Lasix. -Leukocytosis has remained on has been somewhat fluctuating.  Previous hospitalist discussed with Dr. Cathie HoopsYu and pathologist, Dr. Burgess Estellenley, who reviewed patient's smear and was noted to have no blasts or fragmented to PVCs.  Likely reactive leukoerythroblastic changes blood loss versus hemolysis.  Dr. Cathie HoopsYu states this is likely secondary to UTI -Leukocytosis down to 12.5 today.  Acute metabolic encephalopathy superimposed on chronic dementia -Patient presented with altered mental status.  Suspect this is secondary to the above -Continue Aricept, memantine, Seroquel -Minimize sedating medications  Atrial fibrillation with RVR -Cardiology consulted and appreciated, recommended continuing diltiazem -Eliquis has been held due to concern of GI bleed as patient's hemoglobin continues to drop -Heart rate stable  Acute on chronic diastolic heart failure -In the setting of mild volume overload given the patient presented with sepsis and was given IV fluids -BNP 125 -Chest x-ray reviewed, showing CHF pattern -Patient was given IV Lasix and has now been transitioned to oral Lasix -Continue to monitor intake and output, daily weights  Hyponatremia -Suspect multifactorial including volume depletion, SSRI, anorexia -Patient was on IV fluids however this was discontinued -Zoloft has been held -Continue Megace -Resolved -Continue to monitor BMP  Elevated troponin/demand ischemia -With history of CAD status post PCI in 2006 -Relatively flat, peaked to 64.  Not consistent with ACS.  Suspect demand ischemia. -Continue statin -Eliquis currently held  Normocytic anemia/possible GI bleed -Was on Eliquis prior to admission as well as oral iron which has been held -Anemia panel showed iron 30, ferritin 55, folate 14.8, vitamin B12 440 -Previous hospitalist  discussed with hematology, Dr.Yu.  Not feel that the iron panel is typical for iron deficiency.  Recommended pending reticulocyte panel and obtain LDH and haptoglobin.  LDH was normal.  Increased reticulocytosis can be seen acute GI bleed or hemolysis.  T bilirubin is normal, less likely hemolysis. -Gastroenterology consulted and appreciated -Hemoglobin this morning down to 7.2 -Status post EGD: Large radial hernia, normal duodenal bulb and second portion of duodenum.  Normal stomach biopsied.  Normal gastroesophageal junction and esophagus.  Continue clear liquid diet today and colonoscopy for 08/01/2019. -Continue to monitor CBC  Essential hypertension -Continue Cardizem  Dyslipidemia -LDL 38, continue statin  Elevated BUN -Improved  Hypomagnesemia/hypokalemia/hypophosphatemia -Resolved with repletion, continue to monitor and replace as needed  Deconditioning/generalized weakness -PT and OT recommending home health  DVT Prophylaxis  SCDs  Code Status: Full code  Family Communication: None at bedside  Disposition Plan:  Status is: Inpatient  Remains inpatient appropriate because:Inpatient level of care appropriate due to severity of illness.  Continues to have anemia, with a drop in hemoglobin to 7.2 today.  EGD done this morning.  Now pending colonoscopy for 08/01/2019.   Dispo: The patient is from: Home              Anticipated d[facility] is [facility]              Anticipated d/c date is: 2 days              Patient currently is not medically stable to d/c.  Consultants Cardiology Gastroenterology Hematology  Procedures  EGD  Antibiotics   Anti-infectives (From admission, onward)   Start     Dose/Rate Route Frequency Ordered Stop   07/26/19 0600  cephALEXin (KEFLEX) capsule 500 mg     500 mg Oral Every 6 hours 07/25/19 1718 07/29/19 0559   07/22/19 0900  cefTRIAXone (ROCEPHIN) 1 g in sodium chloride 0.9 % 100 mL IVPB  Status:  Discontinued     1 g 200 mL/hr  over 30 Minutes Intravenous Every 24 hours 07/22/19 0248 07/25/19 1714   07/22/19 0145  vancomycin (VANCOCIN) IVPB 1000 mg/200 mL premix     1,000 mg 200 mL/hr over 60 Minutes Intravenous  Once 07/22/19 0144 07/22/19 0349   07/22/19 0045  ceFEPIme (MAXIPIME) 1 g in sodium chloride 0.9 % 100 mL IVPB     1 g 200 mL/hr over 30 Minutes Intravenous  Once 07/22/19 0039 07/22/19 0202      Subjective:   Kimberly Wang seen and examined today.  Patient with no complaints other than she feels hungry this morning.  Even though, we spoke about her not eating given the EGD and the low hemoglobin, she continues to forget.  Otherwise patient has no complaints of chest pain or shortness of breath, dizziness or headache, abdominal pain, nausea or vomiting. Objective:   Vitals:   07/31/19 1001 07/31/19 1133 07/31/19 1143 07/31/19 1153  BP: (!) 127/53 (!) 117/58 120/81 (!) 137/54  Pulse: 86 61 88 88  Resp: 18 11 13 15   Temp: 98.2 F (36.8 C) (P) 98 F (36.7 C)    TempSrc: Temporal     SpO2: 100% 97% 99% 97%  Weight: 77.2 kg     Height: 5\' 3"  (1.6 m)       Intake/Output Summary (Last 24 hours) at 07/31/2019 1228 Last data filed at 07/31/2019 1125 Gross per 24 hour  Intake 540 ml  Output 850 ml  Net -310 ml   Filed Weights   07/30/19 0422 07/31/19 0522 07/31/19 1001  Weight: 76.7 kg 77.2 kg 77.2 kg    Exam  General: Well developed, elderly, chronically ill-appearing, NAD  HEENT: NCAT,  mucous membranes moist.   Cardiovascular: S1 S2 auscultated, irregular  Respiratory: Clear to auscultation bilaterally  Abdomen: Soft, nontender, nondistended, + bowel sounds  Extremities: warm dry without cyanosis clubbing. +++ lower extremity edema.  Neuro: AAOx2 (person, place), has short-term memory deficits.  Nonfocal  Skin: Without rashes exudates or nodules  Psych: Pleasantly confused   Data Reviewed: I have personally reviewed following labs and imaging studies  CBC: Recent Labs  Lab  07/28/19 0454 07/29/19 0731 07/29/19 0856 07/30/19 0700 07/31/19 0505  WBC 18.5* 14.2* 15.9* 17.1* 12.5*  NEUTROABS 14.4* 11.1* 12.4* 13.2* 9.8*  HGB 7.4* 7.3* 7.7* 8.0* 7.2*  HCT 23.4* 23.2* 24.2* 25.1* 22.4*  MCV 98.3 97.9 95.7 96.9 94.9  PLT 269 292 325 406* 170   Basic Metabolic Panel: Recent Labs  Lab 07/26/19 0443 07/26/19 0443 07/27/19 0447 07/27/19 0447 07/28/19 0454 07/29/19 0731 07/29/19 0856 07/30/19 0700 07/31/19 0505  NA 138  --  137  --  137  --  135 136  --   K 4.8  --  4.3  --  4.6  --  4.5 4.6  --   CL 107  --  105  --  108  --  105 104  --   CO2 25  --  24  --  24  --  22 23  --  GLUCOSE 133*  --  134*  --  118*  --  105* 119*  --   BUN 50*  --  34*  --  23  --  19 18  --   CREATININE 0.66  --  0.80  --  0.82  --  0.73 0.80  --   CALCIUM 8.5*  --  8.6*  --  8.6*  --  8.6* 8.6*  --   MG 2.0   < > 2.1   < > 2.1 2.3 2.2 2.3 2.4  PHOS 2.7   < > 2.9   < > 3.3 3.3 3.3 3.5 3.6   < > = values in this interval not displayed.   GFR: Estimated Creatinine Clearance: 49.6 mL/min (by C-G formula based on SCr of 0.8 mg/dL). Liver Function Tests: Recent Labs  Lab 07/26/19 0443 07/27/19 0447 07/28/19 0454 07/29/19 0856 07/30/19 0700  AST 26 27 36 31 33  ALT 30 30 42 41 41  ALKPHOS 60 63 69 69 72  BILITOT 0.4 0.7 0.5 0.8 0.6  PROT 4.9* 5.0* 5.2* 5.5* 5.6*  ALBUMIN 2.3* 2.3* 2.4* 2.7* 2.7*   No results for input(s): LIPASE, AMYLASE in the last 168 hours. No results for input(s): AMMONIA in the last 168 hours. Coagulation Profile: Recent Labs  Lab 07/27/19 1531  INR 1.5*   Cardiac Enzymes: No results for input(s): CKTOTAL, CKMB, CKMBINDEX, TROPONINI in the last 168 hours. BNP (last 3 results) No results for input(s): PROBNP in the last 8760 hours. HbA1C: No results for input(s): HGBA1C in the last 72 hours. CBG: No results for input(s): GLUCAP in the last 168 hours. Lipid Profile: No results for input(s): CHOL, HDL, LDLCALC, TRIG, CHOLHDL,  LDLDIRECT in the last 72 hours. Thyroid Function Tests: No results for input(s): TSH, T4TOTAL, FREET4, T3FREE, THYROIDAB in the last 72 hours. Anemia Panel: No results for input(s): VITAMINB12, FOLATE, FERRITIN, TIBC, IRON, RETICCTPCT in the last 72 hours. Urine analysis:    Component Value Date/Time   COLORURINE YELLOW (A) 07/22/2019 0054   APPEARANCEUR HAZY (A) 07/22/2019 0054   LABSPEC 1.012 07/22/2019 0054   PHURINE 5.0 07/22/2019 0054   GLUCOSEU NEGATIVE 07/22/2019 0054   HGBUR MODERATE (A) 07/22/2019 0054   BILIRUBINUR NEGATIVE 07/22/2019 0054   KETONESUR NEGATIVE 07/22/2019 0054   PROTEINUR NEGATIVE 07/22/2019 0054   NITRITE POSITIVE (A) 07/22/2019 0054   LEUKOCYTESUR TRACE (A) 07/22/2019 0054   Sepsis Labs: (procalcitonin:4,lacticidven:4)  ) Recent Results (from the past 240 hour(s))  Culture, blood (Routine x 2)     Status: None   Collection Time: 07/21/19 10:07 PM   Specimen: BLOOD  Result Value Ref Range Status   Specimen Description BLOOD LEFT ANTECUBITAL  Final   Special Requests   Final    BOTTLES DRAWN AEROBIC AND ANAEROBIC Blood Culture results may not be optimal due to an excessive volume of blood received in culture bottles   Culture   Final    NO GROWTH 5 DAYS Performed at [facility], [address], [place], [place] [address]    Report Status 07/26/2019 FINAL  Final  Culture, blood (Routine x 2)     Status: None   Collection Time: 07/21/19 10:51 PM   Specimen: BLOOD  Result Value Ref Range Status   Specimen Description BLOOD BLOOD RIGHT ARM  Final   Special Requests   Final    BOTTLES DRAWN AEROBIC AND ANAEROBIC Blood Culture adequate volume   Culture   Final  NO GROWTH 5 DAYS Performed at [facility], [address] [place], [place] [address]    Report Status 07/26/2019 FINAL  Final  SARS Coronavirus 2 by RT PCR (hospital order, performed in [facility] hospital lab) Nasopharyngeal Nasopharyngeal Swab      Status: None   Collection Time: 07/22/19 12:34 AM   Specimen: Nasopharyngeal Swab  Result Value Ref Range Status   SARS Coronavirus 2 NEGATIVE NEGATIVE Final    Comment: (NOTE) SARS-CoV-2 target nucleic acids are NOT DETECTED. The SARS-CoV-2 RNA is generally detectable in upper and lower respiratory specimens during the acute phase of infection. The lowest concentration of SARS-CoV-2 viral copies this assay can detect is 250 copies / mL. A negative result does not preclude SARS-CoV-2 infection and should not be used as the sole basis for treatment or other patient management decisions.  A negative result may occur with improper specimen collection / handling, submission of specimen other than nasopharyngeal swab, presence of viral mutation(s) within the areas targeted by this assay, and inadequate number of viral copies (<250 copies / mL). A negative result must be combined with clinical observations, patient history, and epidemiological information. Fact Sheet for Patients:   BoilerBrush.com.cy Fact Sheet for Healthcare Providers: https://pope.com/ This test is not yet approved or cleared  by the [place] FDA and has been authorized for detection and/or diagnosis of SARS-CoV-2 by FDA under an Emergency Use Authorization (EUA).  This EUA will remain in effect (meaning this test can be used) for the duration of the COVID-19 declaration under Section 564(b)(1) of the Act, 21 U.S.C. section 360bbb-3(b)(1), unless the authorization is terminated or revoked sooner. Performed at [facility], [address] [place], [place] [address]   Urine Culture     Status: Abnormal   Collection Time: 07/22/19 12:54 AM   Specimen: Urine, Random  Result Value Ref Range Status   Specimen Description   Final    URINE, RANDOM Performed at [facility], [address] [place], [place] [address]    Special Requests   Final     NONE Performed at [facility], [address] [place], [place] [address]    Culture >=100,000 COLONIES/mL ESCHERICHIA COLI (A)  Final   Report Status 07/25/2019 FINAL  Final   Organism ID, Bacteria ESCHERICHIA COLI (A)  Final      Susceptibility   Escherichia coli - MIC*    AMPICILLIN >=32 RESISTANT Resistant     CEFAZOLIN <=4 SENSITIVE Sensitive     CEFTRIAXONE <=1 SENSITIVE Sensitive     CIPROFLOXACIN 0.5 SENSITIVE Sensitive     GENTAMICIN <=1 SENSITIVE Sensitive     IMIPENEM <=0.25 SENSITIVE Sensitive     NITROFURANTOIN <=16 SENSITIVE Sensitive     TRIMETH/SULFA >=320 RESISTANT Resistant     AMPICILLIN/SULBACTAM 4 SENSITIVE Sensitive     PIP/TAZO <=4 SENSITIVE Sensitive     * >=100,000 COLONIES/mL ESCHERICHIA COLI      Radiology Studies: No results found.   Scheduled Meds:  [MAR Hold] atorvastatin  80 mg Oral Daily   [MAR Hold] diltiazem  240 mg Oral Daily   [MAR Hold] donepezil  10 mg Oral QHS   [MAR Hold] furosemide  20 mg Oral Daily   magnesium citrate  1 Bottle Oral Once   [MAR Hold] megestrol  400 mg Oral Daily   [MAR Hold] melatonin  5 mg Oral Once   [MAR Hold] memantine  10 mg Oral Daily   [MAR Hold] multivitamin with minerals  1 tablet Oral Daily   [MAR Hold] pantoprazole (PROTONIX) IV  40 mg Intravenous Q12H   polyethylene glycol-electrolytes  4,000 mL Oral Once   [MAR Hold] potassium chloride SA  20 mEq Oral Daily   Continuous Infusions:  sodium chloride 20 mL/hr at 07/31/19 0547     LOS: 9 days   Time Spent in minutes   45 minutes  Charla Criscione D.O. on 07/31/2019 at 12:28 PM  Between 7am to 7pm - Please see pager noted on [email or web address]  After 7pm go to [email or web address]  And look for the night coverage person covering for me after hours  [facility] Office  [phone]

## 2019-07-31 NOTE — Progress Notes (Signed)
Physical Therapy Treatment Patient Details Name: Kimberly Wang MRN: [id number] DOB: 23-Dec-1930 Today's Date: 07/31/2019    History of Present Illness Patient is an 86 year old female who presented to the emergency room with acute onset of altered mental status with generalized weakness and confusion and significant diminish p.o. intake, vomiting. Chest x-ray showed bilateral small pleural effusions with mild cardiomegaly. Found to have spesis/UTI. PMH includes Parkinson's dementia, CAD, HTN, MI.     PT Comments    Patient is awake, pleasant, but confused. Patient is able to follow single step commands without difficulty. Seated and standing therapeutic exercises performed with shortness of breath noted. Sp02 monitored 96% with activity on room air. Patient declined ambulating due to fatigue. Patient continues to have generalized weakness and requires Min A for transfers. Recommend to continue PT to maximize function and safety to facilitate return to PLOF. SNF recommended at discharge.    Follow Up Recommendations  SNF     Equipment Recommendations  None recommended by PT    Recommendations for Other Services       Precautions / Restrictions Precautions Precautions: Fall Restrictions Weight Bearing Restrictions: No    Mobility  Bed Mobility                  Transfers Overall transfer level: Needs assistance Equipment used: 1 person hand held assist Transfers: Sit to/from Stand Sit to Stand: Min assist         General transfer comment: 3 bouts of standing performed from recliner chair. verbal cues for safety. occasional lifting assistance for safety, otherwise CGA. patient is short of breath with activity with Sp02 99% on room air and heart rate 86 bpm with activity   Ambulation/Gait             General Gait Details: patient declined ambulating this session due to fatigue    Stairs             Wheelchair Mobility    Modified Rankin (Stroke Patients  Only)       Balance                                            Cognition Arousal/Alertness: Awake/alert Behavior During Therapy: WFL for tasks assessed/performed Overall Cognitive Status: No family/caregiver present to determine baseline cognitive functioning                                 General Comments: patient is confused about recent events, place, time, and situation. patient is able to follow single step commands without difficulty       Exercises General Exercises - Lower Extremity Ankle Circles/Pumps: AAROM;Strengthening;Both;15 reps;Seated Gluteal Sets: AROM;Strengthening;Both;15 reps;Seated Long Arc Quad: AAROM;Both;15 reps;Seated;Strengthening Hip ABduction/ADduction: AAROM;Both;15 reps;Strengthening;Seated Hip Flexion/Marching: AAROM;Both;15 reps;Seated;Strengthening Other Exercises Other Exercises: standing marching x 7 reps each with CGA for safety. verbal cues for technique for all exercises.     General Comments        Pertinent Vitals/Pain Pain Assessment: No/denies pain    Home Living                      Prior Function            PT Goals (current goals can now be found in the care plan section) Acute Rehab PT Goals  Patient Stated Goal: to return to prior level of function  PT Goal Formulation: With patient Time For Goal Achievement: 08/09/19 Potential to Achieve Goals: Fair Progress towards PT goals: Progressing toward goals    Frequency    Min 2X/week      PT Plan Current plan remains appropriate    Co-evaluation              AM-PAC PT "6 Clicks" Mobility   Outcome Measure  Help needed turning from your back to your side while in a flat bed without using bedrails?: A Little Help needed moving from lying on your back to sitting on the side of a flat bed without using bedrails?: A Little Help needed moving to and from a bed to a chair (including a wheelchair)?: A Little Help needed  standing up from a chair using your arms (e.g., wheelchair or bedside chair)?: A Little Help needed to walk in hospital room?: A Little Help needed climbing 3-5 steps with a railing? : A Lot 6 Click Score: 17    End of Session   Activity Tolerance: Patient tolerated treatment well;Patient limited by fatigue Patient left: in chair;with call bell/phone within reach;with chair alarm set Nurse Communication: Mobility status PT Visit Diagnosis: Other abnormalities of gait and mobility (R26.89);Muscle weakness (generalized) (M62.81);Difficulty in walking, not elsewhere classified (R26.2)     Time: 3154-0086 PT Time Calculation (min) (ACUTE ONLY): 16 min  Charges:  $Therapeutic Exercise: 8-22 mins                     Kimberly Wang, PT, MPT    Kimberly Wang 07/31/2019, 4:12 PM

## 2019-07-31 NOTE — Op Note (Signed)
[facility] Gastroenterology Patient Name: Kimberly Wang Procedure Date: 07/31/2019 10:42 AM MRN: [id number] Account #: [id number] Date of Birth: Oct 29, 1930 Admit Type: Inpatient Age: 86 Room: [facility] ENDO ROOM 1 Gender: Female Note Status: Finalized Procedure:             Upper GI endoscopy Indications:           Unexplained iron deficiency anemia Providers:             Lin Landsman MD, MD Referring MD:          Wynona Canes. Kym Groom, MD (Referring MD) Medicines:             Monitored Anesthesia Care Complications:         No immediate complications. Estimated blood loss: None. Procedure:             Pre-Anesthesia Assessment:                        - Prior to the procedure, a History and Physical was                         performed, and patient medications and allergies were                         reviewed. The patient is competent. The risks and                         benefits of the procedure and the sedation options and                         risks were discussed with the patient. All questions                         were answered and informed consent was obtained.                         Patient identification and proposed procedure were                         verified by the physician, the nurse, the                         anesthesiologist, the anesthetist and the technician                         in the pre-procedure area in the procedure room in the                         endoscopy suite. Mental Status Examination: alert and                         oriented. Airway Examination: normal oropharyngeal                         airway and neck mobility. Respiratory Examination:                         clear to auscultation. CV Examination: normal.  Prophylactic Antibiotics: The patient does not require                         prophylactic antibiotics. Prior Anticoagulants: The                         patient has taken Eliquis  (apixaban), last dose was 3                         days prior to procedure. ASA Grade Assessment: III - A                         patient with severe systemic disease. After reviewing                         the risks and benefits, the patient was deemed in                         satisfactory condition to undergo the procedure. The                         anesthesia plan was to use monitored anesthesia care                         (MAC). Immediately prior to administration of                         medications, the patient was re-assessed for adequacy                         to receive sedatives. The heart rate, respiratory                         rate, oxygen saturations, blood pressure, adequacy of                         pulmonary ventilation, and response to care were                         monitored throughout the procedure. The physical                         status of the patient was re-assessed after the                         procedure.                        After obtaining informed consent, the endoscope was                         passed under direct vision. Throughout the procedure,                         the patient's blood pressure, pulse, and oxygen                         saturations were monitored continuously. The Endoscope  was introduced through the mouth, and advanced to the                         second part of duodenum. The upper GI endoscopy was                         accomplished without difficulty. The patient tolerated                         the procedure well. Findings:      The duodenal bulb and second portion of the duodenum were normal.      A large hiatal hernia was found. The proximal extent of the gastric       folds (end of tubular esophagus) was 31 cm from the incisors. The hiatal       narrowing was 38 cm from the incisors. The Z-line was 31 cm from the       incisors.      The entire examined stomach was normal.  Biopsies were taken with a cold       forceps for Helicobacter pylori testing.      The cardia and gastric fundus were normal on retroflexion.      The gastroesophageal junction and examined esophagus were normal. Impression:            - Normal duodenal bulb and second portion of the                         duodenum.                        - Large hiatal hernia.                        - Normal stomach. Biopsied.                        - Normal gastroesophageal junction and esophagus. Recommendation:        - Return patient to hospital ward for ongoing care.                        - Clear liquid diet today.                        - Perform a colonoscopy tomorrow. Procedure Code(s):     --- Professional ---                        [phone], Esophagogastroduodenoscopy, flexible,                         transoral; with biopsy, single or multiple Diagnosis Code(s):     --- Professional ---                        K44.9, Diaphragmatic hernia without obstruction or                         gangrene                        D50.9, Iron deficiency anemia, unspecified CPT copyright 2019 [employer]. All  rights reserved. The codes documented in this report are preliminary and upon coder review may  be revised to meet current compliance requirements. Dr. Ulyess Mort Lin Landsman MD, MD 07/31/2019 11:32:04 AM This report has been signed electronically. Number of Addenda: 0 Note Initiated On: 07/31/2019 10:42 AM Estimated Blood Loss:  Estimated blood loss: none.      [facility]

## 2019-08-01 ENCOUNTER — Encounter: Payer: Self-pay | Admitting: Anesthesiology

## 2019-08-01 ENCOUNTER — Encounter
Admission: EM | Disposition: A | Discharge status: Skilled Nursing Facility | Payer: Self-pay | Source: Home / Self Care | Attending: Internal Medicine

## 2019-08-01 ENCOUNTER — Encounter: Payer: Self-pay | Admitting: *Deleted

## 2019-08-01 DIAGNOSIS — I5043 Acute on chronic combined systolic (congestive) and diastolic (congestive) heart failure: Secondary | ICD-10-CM

## 2019-08-01 DIAGNOSIS — D500 Iron deficiency anemia secondary to blood loss (chronic): Secondary | ICD-10-CM

## 2019-08-01 DIAGNOSIS — G20 Parkinson's disease: Secondary | ICD-10-CM

## 2019-08-01 DIAGNOSIS — F0280 Dementia in other diseases classified elsewhere without behavioral disturbance: Secondary | ICD-10-CM

## 2019-08-01 DIAGNOSIS — I482 Chronic atrial fibrillation, unspecified: Secondary | ICD-10-CM

## 2019-08-01 DIAGNOSIS — D62 Acute posthemorrhagic anemia: Secondary | ICD-10-CM

## 2019-08-01 DIAGNOSIS — I4891 Unspecified atrial fibrillation: Secondary | ICD-10-CM

## 2019-08-01 LAB — CBC WITH DIFFERENTIAL/PLATELET
Abs Immature Granulocytes: 0.13 10*3/uL — ABNORMAL HIGH (ref 0.00–0.07)
Basophils Absolute: 0.1 10*3/uL (ref 0.0–0.1)
Basophils Relative: 0 %
Eosinophils Absolute: 0.1 10*3/uL (ref 0.0–0.5)
Eosinophils Relative: 0 %
HCT: 23 % — ABNORMAL LOW (ref 36.0–46.0)
Hemoglobin: 7.2 g/dL — ABNORMAL LOW (ref 12.0–15.0)
Immature Granulocytes: 1 %
Lymphocytes Relative: 12 %
Lymphs Abs: 1.6 10*3/uL (ref 0.7–4.0)
MCH: 30.4 pg (ref 26.0–34.0)
MCHC: 31.3 g/dL (ref 30.0–36.0)
MCV: 97 fL (ref 80.0–100.0)
Monocytes Absolute: 0.7 10*3/uL (ref 0.1–1.0)
Monocytes Relative: 5 %
Neutro Abs: 11.7 10*3/uL — ABNORMAL HIGH (ref 1.7–7.7)
Neutrophils Relative %: 82 %
Platelets: 345 10*3/uL (ref 150–400)
RBC: 2.37 MIL/uL — ABNORMAL LOW (ref 3.87–5.11)
RDW: 16.2 % — ABNORMAL HIGH (ref 11.5–15.5)
Smear Review: UNDETERMINED
WBC: 14.3 10*3/uL — ABNORMAL HIGH (ref 4.0–10.5)
nRBC: 0.3 % — ABNORMAL HIGH (ref 0.0–0.2)

## 2019-08-01 LAB — BASIC METABOLIC PANEL
Anion gap: 7 (ref 5–15)
BUN: 16 mg/dL (ref 8–23)
CO2: 24 mmol/L (ref 22–32)
Calcium: 8.4 mg/dL — ABNORMAL LOW (ref 8.9–10.3)
Chloride: 107 mmol/L (ref 98–111)
Creatinine, Ser: 0.87 mg/dL (ref 0.44–1.00)
GFR calc Af Amer: >60 mL/min (ref >60)
GFR calc non Af Amer: 60 mL/min — ABNORMAL LOW (ref >60)
Glucose, Bld: 103 mg/dL — ABNORMAL HIGH (ref 70–99)
Potassium: 4.8 mmol/L (ref 3.5–5.1)
Sodium: 138 mmol/L (ref 135–145)

## 2019-08-01 LAB — COPPER, SERUM: Copper: 117 ug/dL (ref 80–158)

## 2019-08-01 LAB — RETIC PANEL
Immature Retic Fract: 33.8 % — ABNORMAL HIGH (ref 2.3–15.9)
RBC.: 2.43 MIL/uL — ABNORMAL LOW (ref 3.87–5.11)
Retic Count, Absolute: 178.6 10*3/uL (ref 19.0–186.0)
Retic Ct Pct: 7.4 % — ABNORMAL HIGH (ref 0.4–3.1)
Reticulocyte Hemoglobin: 22.9 pg — ABNORMAL LOW (ref >27.9)

## 2019-08-01 LAB — MAGNESIUM: Magnesium: 2.4 mg/dL (ref 1.7–2.4)

## 2019-08-01 LAB — BRAIN NATRIURETIC PEPTIDE: B Natriuretic Peptide: 310.2 pg/mL — ABNORMAL HIGH (ref 0.0–100.0)

## 2019-08-01 LAB — PHOSPHORUS: Phosphorus: 3.6 mg/dL (ref 2.5–4.6)

## 2019-08-01 LAB — SURGICAL PATHOLOGY

## 2019-08-01 SURGERY — COLONOSCOPY WITH PROPOFOL
Anesthesia: General

## 2019-08-01 MED ORDER — BOOST / RESOURCE BREEZE PO LIQD CUSTOM
1.0000 | Freq: Three times a day (TID) | ORAL | Status: DC
  Administered 2019-08-01 – 2019-08-02 (×3): 1 via ORAL

## 2019-08-01 MED ORDER — POLYETHYLENE GLYCOL 3350 17 GM/SCOOP PO POWD
1.0000 | Freq: Once | ORAL | Status: AC
  Administered 2019-08-01: 255 g via ORAL
  Filled 2019-08-01: qty 255

## 2019-08-01 MED ORDER — SODIUM CHLORIDE 0.9 % IV SOLN
200.0000 mg | Freq: Every day | INTRAVENOUS | Status: AC
  Administered 2019-08-01 – 2019-08-03 (×3): 200 mg via INTRAVENOUS
  Filled 2019-08-01 (×3): qty 10

## 2019-08-01 MED ORDER — MAGNESIUM CITRATE PO SOLN
1.0000 | Freq: Once | ORAL | Status: AC
  Administered 2019-08-01: 1 via ORAL
  Filled 2019-08-01: qty 296

## 2019-08-01 MED ORDER — APIXABAN 5 MG PO TABS
5.0000 mg | ORAL_TABLET | Freq: Two times a day (BID) | ORAL | Status: DC
  Administered 2019-08-01: 5 mg via ORAL
  Filled 2019-08-01: qty 1

## 2019-08-01 NOTE — Progress Notes (Signed)
Occupational Therapy Treatment Patient Details Name: Kimberly Wang MRN: [id number] DOB: 03/14/1930 Today's Date: 08/01/2019    History of present illness Patient is an 86 year old female who presented to the emergency room with acute onset of altered mental status with generalized weakness and confusion and significant diminish p.o. intake, vomiting. Chest x-ray showed bilateral small pleural effusions with mild cardiomegaly. Found to have spesis/UTI. PMH includes Parkinson's dementia, CAD, HTN, MI.    OT comments  Kimberly Wang continues to present to OT pleasantly confused and disoriented, primarily limited by her shortness of breath.  Pt was disoriented to time, date, recent events, and situation at beginning of session.  OTR provided min cueing for pt to use calendar and clock to state date and time.  OTR provided prompting for pt to recognize it was her birthday.  Pt able to state correct time and date when prompted by OTR, but has poor short term memory when asked a few minutes later.  OTR provided mod assist for pt to don shoes.  Pt is able to use figure 4 position to don shoes, and would likely be able to don shoes independently if her shoes were larger (pt's feet appear to be swollen and shoes do not fit well).  Pt became significantly SOB with bending over to don shoes (SpO2 remained at 98%).  OTR attempted to engage pt in functional mobility practice, but pt politely declined 2/2 shortness of breath.  Kimberly Wang will continue to benefit from skilled OT services in acute setting to address cognition, safety, endurance, and independence in ADLs.  SNF remains most appropriate discharge recommendation at this time 2/2 decreased safety with pt's impaired cognition/orientation and limited endurance.   Follow Up Recommendations  Supervision/Assistance - 24 hour;SNF    Equipment Recommendations  3 in 1 bedside commode    Recommendations for Other Services      Precautions / Restrictions  Precautions Precautions: Fall Restrictions Weight Bearing Restrictions: No       Mobility Bed Mobility Overal bed mobility: Needs Assistance             General bed mobility comments: not assessed this session as patient sitting up in chair pre and post session   Transfers Overall transfer level: Needs assistance               General transfer comment: Not assessed this date 2/2 pt SOB and request to defer mobility    Balance Overall balance assessment: Needs assistance Sitting-balance support: Feet supported;No upper extremity supported Sitting balance-Leahy Scale: Good                                     ADL either performed or assessed with clinical judgement   ADL Overall ADL's : Needs assistance/impaired Eating/Feeding: Set up;Sitting   Grooming: Set up;Sitting   Upper Body Bathing: Set up;Sitting   Lower Body Bathing: Minimal assistance;Sit to/from stand   Upper Body Dressing : Set up;Sitting   Lower Body Dressing: Moderate assistance;Sit to/from stand Lower Body Dressing Details (indicate cue type and reason): OTR provided mod assist to don footwear (pt would likely be able to do this with less assist if her shoes were larger)                     Vision Baseline Vision/History: Wears glasses Wears Glasses: Reading only Patient Visual Report: No change from baseline Vision Assessment?:  No apparent visual deficits   Perception     Praxis      Cognition Arousal/Alertness: Awake/alert Behavior During Therapy: WFL for tasks assessed/performed Overall Cognitive Status: No family/caregiver present to determine baseline cognitive functioning                                 General Comments: Pt is pleasantly confused and disoriented about recent events, time, and situation.  Pt able to orient to date at time with cueing from OTR to look at calendar/clock.  Pt follows single step commands without difficulty.         Exercises Other Exercises Other Exercises: provided mod assist for donning footwear, interventions to increase pt orientation   Shoulder Instructions       General Comments      Pertinent Vitals/ Pain       Pain Assessment: No/denies pain  Home Living                                          Prior Functioning/Environment              Frequency  Min 1X/week        Progress Toward Goals  OT Goals(current goals can now be found in the care plan section)  Progress towards OT goals: Progressing toward goals  Acute Rehab OT Goals Patient Stated Goal: to return to prior level of function  OT Goal Formulation: With patient Time For Goal Achievement: 08/08/19 Potential to Achieve Goals: Good  Plan      Co-evaluation                 AM-PAC OT "6 Clicks" Daily Activity     Outcome Measure   Help from another person eating meals?: None Help from another person taking care of personal grooming?: None Help from another person toileting, which includes using toliet, bedpan, or urinal?: A Little Help from another person bathing (including washing, rinsing, drying)?: A Little Help from another person to put on and taking off regular upper body clothing?: None Help from another person to put on and taking off regular lower body clothing?: A Lot 6 Click Score: 20    End of Session    OT Visit Diagnosis: Unsteadiness on feet (R26.81);Other symptoms and signs involving cognitive function   Activity Tolerance Patient tolerated treatment well   Patient Left in chair;with call bell/phone within reach;with chair alarm set   Nurse Communication          Time: 6761-9509 OT Time Calculation (min): 30 min  Charges: OT General Charges $OT Visit: 1 Visit OT Treatments $Self Care/Home Management : 23-37 mins  Kimberly Wang, OTR/L 08/01/19, 4:02 PM

## 2019-08-01 NOTE — Progress Notes (Signed)
Patient's daughter-in-law insisted inpatient colonoscopy Dr. Cathie Hoops is concerned about worsening of anemia with reinitiation of anticoagulation. Confirmed iron deficiency anemia based on reticulocyte panel  Change to clear liquids today Trial of MiraLAX prep, administer magnesium citrate GI will perform inpatient colonoscopy when her stools are clear N.p.o. past midnight  Arlyss Repress, MD [address]  [address]  [place], [place] [address]  Main: [phone]  Fax: [phone] Pager: [phone]

## 2019-08-01 NOTE — Progress Notes (Signed)
Patient Name: Kimberly Wang Date of Encounter: 08/01/2019  Hospital Problem List     Active Problems:   Coagulation defect (HCC)   Sepsis due to gram-negative UTI (HCC)   Severe sepsis (HCC)   Dementia due to Parkinson's disease without behavioral disturbance (HCC)   Benign essential HTN   CAD (coronary artery disease)   Anemia   Sepsis due to urinary tract infection [facility])    Patient Profile     86 year old female with history of HFpEF followed at [facility], history of coronary artery disease status post PCI approximately 15 years ago, history atrial fibrillation rate controlled and anticoagulated with Eliquis. Rate control with carvedilol and diltiazem admitted with UTI sepsis. She received a lot of fluid initially and developed volume overload. Has improved with careful diuresis. HGB has dropped.    Subjective   Somewhat uncooperative yesterday.  More relaxed today.  Inpatient Medications    . atorvastatin  80 mg Oral Daily  . diltiazem  240 mg Oral Daily  . donepezil  10 mg Oral QHS  . furosemide  20 mg Oral Daily  . megestrol  400 mg Oral Daily  . melatonin  5 mg Oral Once  . memantine  10 mg Oral Daily  . multivitamin with minerals  1 tablet Oral Daily  . pantoprazole (PROTONIX) IV  40 mg Intravenous Q12H  . potassium chloride SA  20 mEq Oral Daily    Vital Signs    Vitals:   07/31/19 1729 07/31/19 1919 08/01/19 0653 08/01/19 0728  BP: 125/83 140/82 138/64 116/76  Pulse: 78 80 81 (!) 108  Resp: 18 20 (!) 22 17  Temp: 97.8 F (36.6 C) (!) 97.4 F (36.3 C) 98.4 F (36.9 C) 98 F (36.7 C)  TempSrc:  Oral Oral Oral  SpO2: 99% 100% 97% 97%  Weight:   77.3 kg   Height:        Intake/Output Summary (Last 24 hours) at 08/01/2019 0743 Last data filed at 08/01/2019 0659 Gross per 24 hour  Intake 1200 ml  Output 850 ml  Net 350 ml   Filed Weights   07/31/19 0522 07/31/19 1001 08/01/19 0653  Weight: 77.2 kg 77.2 kg 77.3 kg    Physical  Exam    GEN: Well nourished, well developed, in no acute distress.  HEENT: normal.  Neck: Supple, no JVD, carotid bruits, or masses. Cardiac: Irregular irregular Respiratory:  Respirations regular and unlabored, clear to auscultation bilaterally. GI: Soft, nontender, nondistended, BS + x 4. MS: no deformity or atrophy. Skin: warm and dry, no rash. Neuro:  Strength and sensation are intact.   Labs    CBC Recent Labs    07/31/19 0505 08/01/19 0643  WBC 12.5* 14.3*  NEUTROABS 9.8* 11.7*  HGB 7.2* 7.2*  HCT 22.4* 23.0*  MCV 94.9 97.0  PLT 329 345   Basic Metabolic Panel Recent Labs    38/10/17 0700 07/30/19 0700 07/31/19 0505 08/01/19 0643  NA 136  --   --  138  K 4.6  --   --  4.8  CL 104  --   --  107  CO2 23  --   --  24  GLUCOSE 119*  --   --  103*  BUN 18  --   --  16  CREATININE 0.80  --   --  0.87  CALCIUM 8.6*  --   --  8.4*  MG 2.3   < > 2.4 2.4  PHOS  3.5   < > 3.6 3.6   < > = values in this interval not displayed.   Liver Function Tests Recent Labs    07/29/19 0856 07/30/19 0700  AST 31 33  ALT 41 41  ALKPHOS 69 72  BILITOT 0.8 0.6  PROT 5.5* 5.6*  ALBUMIN 2.7* 2.7*   No results for input(s): LIPASE, AMYLASE in the last 72 hours. Cardiac Enzymes No results for input(s): CKTOTAL, CKMB, CKMBINDEX, TROPONINI in the last 72 hours. BNP No results for input(s): BNP in the last 72 hours. D-Dimer No results for input(s): DDIMER in the last 72 hours. Hemoglobin A1C No results for input(s): HGBA1C in the last 72 hours. Fasting Lipid Panel No results for input(s): CHOL, HDL, LDLCALC, TRIG, CHOLHDL, LDLDIRECT in the last 72 hours. Thyroid Function Tests No results for input(s): TSH, T4TOTAL, T3FREE, THYROIDAB in the last 72 hours.  Invalid input(s): FREET3  Telemetry    Atrial fibrillation with controlled ventricular response  ECG    A. fib with variable ventricular response  Radiology    CT ABDOMEN PELVIS WO CONTRAST  Result Date:  07/22/2019 CLINICAL DATA:  Sepsis EXAM: CT ABDOMEN AND PELVIS WITHOUT CONTRAST TECHNIQUE: Multidetector CT imaging of the abdomen and pelvis was performed following the standard protocol without IV contrast. COMPARISON:  None. FINDINGS: Lower chest: There is mild cardiomegaly. Mitral valve calcifications. A moderate paraesophageal hernia containing contrast is seen. Hepatobiliary: Although limited due to the lack of intravenous contrast, normal in appearance without gross focal abnormality. The patient is status post cholecystectomy. No biliary ductal dilation. Pancreas:  Unremarkable.  No surrounding inflammatory changes. Spleen: Normal in size. Although limited due to the lack of intravenous contrast, normal in appearance. Adrenals/Urinary Tract: Both adrenal glands appear normal. The kidneys and collecting system appear normal without evidence of urinary tract calculus or hydronephrosis. Bladder is unremarkable. Stomach/Bowel: The stomach, small bowel, are normal in appearance. There is scattered colonic diverticula without diverticulitis. Vascular/Lymphatic: There are no enlarged abdominal or pelvic lymph nodes. Scattered aortic atherosclerotic calcifications are seen without aneurysmal dilatation. Reproductive: The uterus and adnexa are unremarkable. Other: A small fat and transverse colon containing umbilical hernia seen within the mid abdomen. Musculoskeletal: No acute or significant osseous findings. IMPRESSION: Moderate paraesophageal hernia. Diverticulosis without diverticulitis. No other acute intra-abdominal or pelvic pathology to explain the patient's symptoms. Aortic Atherosclerosis (ICD10-I70.0). Electronically Signed   By: Jonna Clark M.D.   On: 07/22/2019 01:40   DG Chest 1 View  Result Date: 07/27/2019 CLINICAL DATA:  Shortness of breath EXAM: CHEST  1 VIEW COMPARISON:  July 26, 2019 FINDINGS: There is unchanged mild cardiomegaly. There is aortic knob and descending intrathoracic aortic  calcifications. There is slight interval improvement in the prominence of the central pulmonary vasculature. No acute osseous abnormality. IMPRESSION: Slight interval improvement in the pulmonary vascular congestion. Electronically Signed   By: Jonna Clark M.D.   On: 07/27/2019 06:04   DG Chest 1 View  Result Date: 07/26/2019 CLINICAL DATA:  Shortness of breath. EXAM: CHEST  1 VIEW COMPARISON:  07/23/2019. FINDINGS: Mediastinum hilar structures normal. Cardiomegaly. Mild bilateral interstitial prominence and small pleural effusions. CHF could present in this fashion. Sliding hiatal hernia cannot be excluded. IMPRESSION: Cardiomegaly. Mild bilateral interstitial prominence and small pleural effusions. CHF could present in this fashion. Electronically Signed   By: Maisie Fus  Register   On: 07/26/2019 07:52   DG Chest 1 View  Result Date: 07/23/2019 CLINICAL DATA:  Dyspnea EXAM: CHEST  1 VIEW COMPARISON:  Chest radiograph from one day prior. FINDINGS: Stable cardiomediastinal silhouette with mild cardiomegaly. No pneumothorax. Stable small left pleural effusion. No right pleural effusion. No overt pulmonary edema. Streaky bibasilar lung opacities are similar IMPRESSION: Stable small left pleural effusion. Stable streaky bibasilar lung opacities, favor atelectasis or scarring. Stable mild cardiomegaly without overt pulmonary edema. Electronically Signed   By: Delbert Phenix M.D.   On: 07/23/2019 04:50   DG Chest 1 View  Result Date: 07/21/2019 CLINICAL DATA:  Sepsis. EXAM: CHEST  1 VIEW COMPARISON:  None. FINDINGS: There are small bilateral pleural effusions. There is a left perihilar density of unknown clinical significance. The heart size is mildly enlarged. There are dense aortic calcifications. There are few streaky airspace opacities at the lung bases favored to represent areas of atelectasis. There is no pneumothorax. IMPRESSION: 1. Small bilateral pleural effusions. 2. Mild cardiomegaly. 3. Slightly  prominent left perihilar region may be secondary to a dilated pulmonary artery or lymphadenopathy. A follow-up two-view chest x-ray is recommended in 4-6 weeks. Electronically Signed   By: Katherine Mantle M.D.   On: 07/21/2019 22:34   DG Chest Port 1 View  Result Date: 07/22/2019 CLINICAL DATA:  Shortness of breath. EXAM: PORTABLE CHEST 1 VIEW COMPARISON:  Jul 21, 2019 FINDINGS: Cardiomegaly. Skin fold over the lateral right chest. The hila and mediastinum are normal. No pneumothorax. No nodules or masses. No focal infiltrates. IMPRESSION: No active disease. Electronically Signed   By: Gerome Sam III M.D   On: 07/22/2019 15:12   ECHOCARDIOGRAM COMPLETE  Result Date: 07/27/2019    ECHOCARDIOGRAM REPORT   Patient Name:   YARETHZY CROAK Date of Exam: 07/26/2019 Medical Rec #:  [id number]   Height:       64.0 in Accession #:    [id number]  Weight:       164.2 lb Date of Birth:  Oct 14, 1930    BSA:          1.799 m Patient Age:    88 years    BP:           117/65 mmHg Patient Gender: F           HR:           124 bpm. Exam Location:  [facility] Procedure: 2D Echo, Cardiac Doppler and Color Doppler Indications:     Dyspnea 786.09 / R06.00  History:         Patient has no prior history of Echocardiogram examinations.                  CAD; Risk Factors:Hypertension. MI.  Sonographer:     Neysa Bonito Roar Referring Phys:  [id number] Kateri Mc LATIF [facility] Diagnosing Phys: Lorine Bears MD IMPRESSIONS  1. Left ventricular ejection fraction, by estimation, is 40 to 45%. The left ventricle has mildly decreased function. The left ventricle demonstrates regional wall motion abnormalities with possible anterior/anteroseptal hypokinesis. There is mild left ventricular hypertrophy. Left ventricular diastolic parameters are indeterminate.  2. Right ventricular systolic function is normal. The right ventricular size is normal. There is mildly elevated pulmonary artery systolic pressure.  3. Left atrial size was severely dilated.  4. Right  atrial size was moderately dilated.  5. The mitral valve is abnormal. Moderate mitral valve regurgitation. No evidence of mitral stenosis.  6. The aortic valve is abnormal. Aortic valve regurgitation is moderate. Mild aortic valve sclerosis is present, with no evidence of aortic valve stenosis. FINDINGS  Left Ventricle: Left ventricular ejection fraction, by estimation,  is 40 to 45%. The left ventricle has mildly decreased function. The left ventricle demonstrates regional wall motion abnormalities. The left ventricular internal cavity size was normal in size. There is mild left ventricular hypertrophy. Left ventricular diastolic parameters are indeterminate. Right Ventricle: The right ventricular size is normal. No increase in right ventricular wall thickness. Right ventricular systolic function is normal. There is mildly elevated pulmonary artery systolic pressure. The tricuspid regurgitant velocity is 2.69  m/s, and with an assumed right atrial pressure of 10 mmHg, the estimated right ventricular systolic pressure is 78.2 mmHg. Left Atrium: Left atrial size was severely dilated. Right Atrium: Right atrial size was moderately dilated. Pericardium: There is no evidence of pericardial effusion. Mitral Valve: The mitral valve is abnormal. There is moderate thickening of the mitral valve leaflet(s). There is mild calcification of the mitral valve leaflet(s). Normal mobility of the mitral valve leaflets. Moderate mitral annular calcification. Moderate mitral valve regurgitation. No evidence of mitral valve stenosis. Tricuspid Valve: The tricuspid valve is normal in structure. Tricuspid valve regurgitation is not demonstrated. No evidence of tricuspid stenosis. Aortic Valve: The aortic valve is abnormal. Aortic valve regurgitation is moderate. Mild aortic valve sclerosis is present, with no evidence of aortic valve stenosis. Aortic valve mean gradient measures 7.5 mmHg. Aortic valve peak gradient measures 14.1 mmHg.  Aortic valve area, by VTI measures 1.73 cm. Pulmonic Valve: The pulmonic valve was normal in structure. Pulmonic valve regurgitation is not visualized. No evidence of pulmonic stenosis. Aorta: The aortic root is normal in size and structure. Venous: The inferior vena cava was not well visualized. IAS/Shunts: No atrial level shunt detected by color flow Doppler.  LEFT VENTRICLE PLAX 2D LVIDd:         4.47 cm  Diastology LVIDs:         3.12 cm  LV e' lateral:   6.64 cm/s LV PW:         1.14 cm  LV E/e' lateral: 23.8 LV IVS:        1.06 cm  LV e' medial:    8.81 cm/s LVOT diam:     1.70 cm  LV E/e' medial:  17.9 LV SV:         52 LV SV Index:   29 LVOT Area:     2.27 cm  RIGHT VENTRICLE RV Mid diam:    2.29 cm RV S prime:     12.70 cm/s TAPSE (M-mode): 1.6 cm LEFT ATRIUM             Index       RIGHT ATRIUM           Index LA diam:        4.20 cm 2.33 cm/m  RA Area:     19.00 cm LA Vol (A2C):   92.9 ml 51.64 ml/m RA Volume:   44.60 ml  24.79 ml/m LA Vol (A4C):   82.8 ml 46.02 ml/m LA Biplane Vol: 91.4 ml 50.80 ml/m  AORTIC VALVE                    PULMONIC VALVE AV Area (Vmax):    1.54 cm     PV Vmax:        1.10 m/s AV Area (Vmean):   1.56 cm     PV Peak grad:   4.8 mmHg AV Area (VTI):     1.73 cm     RVOT Peak grad: 2 mmHg AV Vmax:  187.50 cm/s AV Vmean:          123.000 cm/s AV VTI:            0.301 m AV Peak Grad:      14.1 mmHg AV Mean Grad:      7.5 mmHg LVOT Vmax:         127.00 cm/s LVOT Vmean:        84.500 cm/s LVOT VTI:          0.229 m LVOT/AV VTI ratio: 0.76  AORTA Ao Root diam: 2.20 cm MITRAL VALVE                TRICUSPID VALVE MV Area (PHT): 3.16 cm     TR Peak grad:   28.9 mmHg MV Decel Time: 240 msec     TR Vmax:        269.00 cm/s MV E velocity: 158.00 cm/s                             SHUNTS                             Systemic VTI:  0.23 m                             Systemic Diam: 1.70 cm Lorine Bears MD Electronically signed by Lorine Bears MD Signature Date/Time:  07/27/2019/8:13:34 AM    Final     Assessment & Plan    86 y.o.femalewith history ofheart failure with preserved ejection fraction, chronic atrial fibrillation, coronary artery disease, dementia who is followed by Marzetta Merino [facility] who was admitted with weakness and acute onset of confusion worse over her baseline.She had a cardiac MRI in 2011 showing normal RV and LV function. There was a left atrial thrombus with no evidence of myocardial infarction. She had a non-STEMI in 2006 with cardiac cath revealing distal LAD disease not amenable to PCI. She had a PCI of the RCA with a Taxus stent. This was done in 2006 earlier. She has been on rate control with diltiazem for her A. fib. She has been on Eliquis for anticoagulation. She was diagnosed with both mixed Alzheimer's and vascular dementia in 2015. Her most recent outpatient regimen included apixaban 5 mg twice daily, atorvastatin 80 mg daily, carvedilol 6.25 mg twice daily, diltiazem 240 mg daily, furosemide 80 mg twice daily, spironolactone 25 mg daily along with potassium chloride to 20 mEq daily.   1. HFpEF-EF has been well-preserved by echo and cardiac MRI at [facility] with normal RV and LV function. Chest x-ray did not show pulmonary edema on admission. Fluid status appears to be over hydration. She does not appear acutely in congestive heart failure at present. Heart rate is controlled and she is oxygenating well. Would continue to carefully diurese following renal function and symptoms. BNP was mildly elevated.Will repeat today. Echo done during this admission was read as showing an EF of 40 to 45% with moderate MR and AI. Continue with current protocol.   2. Atrial fibrillation-rate appears well controlled at present. We will continue with diltiazem . Continue to hold eliquis due to anemia and possible active bleeding.   Patient was planned for GI evaluation today however does not appear to have  been compliant with prep.  3. Sepsis-continue with current rx.  4. Dementia-has mixed vascular and Alzheimer's dementia. Per chart, appears to be getting near her baseline.  5. Coronary artery disease-status post PCI. This was done in 2006. No clinical evidence of ischemia. Patient denies chest pain at present. Not a candidate for invasive evaluation at present. Cardiac markers are unremarkable.  6.  Anemia-appreciate GI and hematology evaluation.   Signed, Darlin PriestlyKenneth A. Valary Manahan MD 08/01/2019, 7:43 AM  Pager: [phone]

## 2019-08-01 NOTE — Progress Notes (Signed)
Patient refused bowel prep per her nurse Colonoscopy cannot be performed until she is prepped In the absence of active GI bleeding, further endoscopic evaluation can be pursued as outpatient Okay to resume anticoagulation from GI standpoint Patient can follow-up with GI as outpatient  GI will sign off at this time, please call us back with questions or concerns  Arlyss Repress, MD [address]  [address]  [place], [place] [address]  Main: [phone]  Fax: [phone] Pager: [phone]

## 2019-08-01 NOTE — Progress Notes (Signed)
Hematology/Oncology Progress Note [facility] [phone] [phone]  Patient Care Team: [name], MD as PCP - General (Family Medicine)   Name of the patient: Kimberly Wang  [id number]  Apr 10, 1930  Date of visit: 08/01/19   INTERVAL HISTORY-  Patient was sitting in the chair.  She has no new complaints.    Review of systems- Review of Systems  Unable to perform ROS: Dementia    Allergies  Allergen Reactions  . Latex Hives and Rash  . Lidocaine Hcl Other (See Comments)    Other Reaction: Other reaction. Patient can't take Novacaine  . Codeine Other (See Comments)  . Morphine Other (See Comments)  . Novocain [Procaine]     Heart races  . Other Other (See Comments)    Uncoded Allergy. Allergen: RED PEPPER    Patient Active Problem List   Diagnosis Date Noted  . A-fib (HCC) 08/01/2019  . Sepsis due to urinary tract infection (HCC)   . Anemia   . Sepsis due to gram-negative UTI (HCC) 07/22/2019  . Severe sepsis (HCC) 07/22/2019  . Dementia due to Parkinson's disease without behavioral disturbance (HCC) 07/22/2019  . Benign essential HTN 07/22/2019  . CAD (coronary artery disease) 07/22/2019  . Pain due to onychomycosis of toenails of both feet 05/17/2019  . Coagulation defect (HCC) 05/17/2019     Past Medical History:  Diagnosis Date  . Coronary artery disease   . Dementia (HCC)   . Hypertension   . Myocardial infarct [facility])      Past Surgical History:  Procedure Laterality Date  . CHOLECYSTECTOMY    . CORONARY STENT PLACEMENT    . ESOPHAGOGASTRODUODENOSCOPY (EGD) WITH PROPOFOL N/A 07/31/2019   Procedure: ESOPHAGOGASTRODUODENOSCOPY (EGD) WITH PROPOFOL;  Surgeon: Toney Reil, MD;  Location: [facility] ENDOSCOPY;  Service: Gastroenterology;  Laterality: N/A;    Social History   Socioeconomic History  . Marital status: Married    Spouse name: Not on file  . Number of children: Not on file  . Years of  education: Not on file  . Highest education level: Not on file  Occupational History  . Not on file  Tobacco Use  . Smoking status: Never Smoker  . Smokeless tobacco: Never Used  Substance and Sexual Activity  . Alcohol use: Never  . Drug use: Never  . Sexual activity: Not on file  Other Topics Concern  . Not on file  Social History Narrative  . Not on file   Social Determinants of Health   Financial Resource Strain:   . Difficulty of Paying Living Expenses:   Food Insecurity:   . Worried About Programme researcher, broadcasting/film/video in the Last Year:   . Barista in the Last Year:   Transportation Needs:   . Freight forwarder (Medical):   Marland Kitchen Lack of Transportation (Non-Medical):   Physical Activity:   . Days of Exercise per Week:   . Minutes of Exercise per Session:   Stress:   . Feeling of Stress :   Social Connections:   . Frequency of Communication with Friends and Family:   . Frequency of Social Gatherings with Friends and Family:   . Attends Religious Services:   . Active Member of Clubs or Organizations:   . Attends Banker Meetings:   Marland Kitchen Marital Status:   Intimate Partner Violence:   . Fear of Current or Ex-Partner:   . Emotionally Abused:   Marland Kitchen Physically Abused:   .  Sexually Abused:      History reviewed. No pertinent family history.   Current Facility-Administered Medications:  .  acetaminophen (TYLENOL) tablet 650 mg, 650 mg, Oral, Q6H PRN, 650 mg at 07/25/19 2125 **OR** acetaminophen (TYLENOL) suppository 650 mg, 650 mg, Rectal, Q6H PRN, Mansy, Jan A, MD .  apixaban (ELIQUIS) tablet 5 mg, 5 mg, Oral, BID, Dalia Heading, MD, 5 mg at 08/01/19 0912 .  atorvastatin (LIPITOR) tablet 80 mg, 80 mg, Oral, Daily, Mansy, Jan A, MD, 80 mg at 08/01/19 0912 .  diltiazem (CARDIZEM CD) 24 hr capsule 240 mg, 240 mg, Oral, Daily, Mansy, Jan A, MD, 240 mg at 08/01/19 0913 .  donepezil (ARICEPT) tablet 10 mg, 10 mg, Oral, QHS, Mansy, Jan A, MD, 10 mg at 07/31/19  2025 .  furosemide (LASIX) tablet 20 mg, 20 mg, Oral, Daily, Dalia Heading, MD, 20 mg at 08/01/19 0912 .  ipratropium-albuterol (DUONEB) 0.5-2.5 (3) MG/3ML nebulizer solution 3 mL, 3 mL, Nebulization, Q6H PRN, Jimmye Norman, NP, 3 mL at 07/31/19 0527 .  iron sucrose (VENOFER) 200 mg in sodium chloride 0.9 % 100 mL IVPB, 200 mg, Intravenous, Daily, Rickard Patience, MD .  magnesium hydroxide (MILK OF MAGNESIA) suspension 30 mL, 30 mL, Oral, Daily PRN, Mansy, Jan A, MD .  megestrol (MEGACE) 400 MG/10ML suspension 400 mg, 400 mg, Oral, Daily, Drema Dallas, MD, 400 mg at 08/01/19 0912 .  melatonin tablet 5 mg, 5 mg, Oral, Once, Manuela Schwartz, NP .  memantine [facility]) tablet 10 mg, 10 mg, Oral, Daily, Mansy, Jan A, MD, 10 mg at 08/01/19 0912 .  multivitamin with minerals tablet 1 tablet, 1 tablet, Oral, Daily, Valrie Hart A, RPH, 1 tablet at 08/01/19 0912 .  nitroGLYCERIN (NITROSTAT) SL tablet 0.4 mg, 0.4 mg, Sublingual, Q5 min PRN, Mansy, Jan A, MD .  ondansetron (ZOFRAN) tablet 4 mg, 4 mg, Oral, Q6H PRN **OR** ondansetron (ZOFRAN) injection 4 mg, 4 mg, Intravenous, Q6H PRN, Mansy, Jan A, MD .  pantoprazole (PROTONIX) injection 40 mg, 40 mg, Intravenous, Q12H, Sheikh, Omair Latif, DO, 40 mg at 08/01/19 0912 .  potassium chloride SA (KLOR-CON) CR tablet 20 mEq, 20 mEq, Oral, Daily, Mansy, Jan A, MD, 20 mEq at 08/01/19 0912 .  QUEtiapine (SEROQUEL) tablet 25 mg, 25 mg, Oral, QHS PRN, Jimmye Norman, NP, 25 mg at 07/31/19 2024 .  traZODone (DESYREL) tablet 25 mg, 25 mg, Oral, QHS PRN, Mansy, Jan A, MD, 25 mg at 07/31/19 2024   Physical exam:  Vitals:   07/31/19 1919 08/01/19 0653 08/01/19 0728 08/01/19 1118  BP: 140/82 138/64 116/76 112/67  Pulse: 80 81 (!) 108 (!) 51  Resp: 20 (!) 22 17 18   Temp: (!) 97.4 F (36.3 C) 98.4 F (36.9 C) 98 F (36.7 C) 98.4 F (36.9 C)  TempSrc: Oral Oral Oral Oral  SpO2: 100% 97% 97% 93%  Weight:  170 lb 6.4 oz (77.3 kg)    Height:        Physical Exam  Constitutional: No distress.  HENT:  Head: Normocephalic and atraumatic.  Mouth/Throat: No oropharyngeal exudate.  Eyes: Pupils are equal, round, and reactive to light. EOM are normal. No scleral icterus.  Cardiovascular: Normal rate and regular rhythm.  No murmur heard. Pulmonary/Chest: Effort normal. No respiratory distress. She has no rales. She exhibits no tenderness.  Abdominal: Soft.  Musculoskeletal:        General: Normal range of motion.     Cervical back: Normal range of  motion and neck supple.  Neurological: She is alert.  Skin: Skin is warm and dry. She is not diaphoretic. No erythema. There is pallor.  Psychiatric: Affect normal.       CMP Latest Ref Rng & Units 08/01/2019  Glucose 70 - 99 mg/dL 409(W)  BUN 8 - 23 mg/dL 16  Creatinine 1.19 - 1.47 mg/dL 8.29  Sodium 562 - 130 mmol/L 138  Potassium 3.5 - 5.1 mmol/L 4.8  Chloride 98 - 111 mmol/L 107  CO2 22 - 32 mmol/L 24  Calcium 8.9 - 10.3 mg/dL 8.6(V)  Total Protein 6.5 - 8.1 g/dL -  Total Bilirubin 0.3 - 1.2 mg/dL -  Alkaline Phos 38 - 784 U/L -  AST 15 - 41 U/L -  ALT 0 - 44 U/L -   CBC Latest Ref Rng & Units 08/01/2019  WBC 4.0 - 10.5 K/uL 14.3(H)  Hemoglobin 12.0 - 15.0 g/dL 7.2(L)  Hematocrit 36.0 - 46.0 % 23.0(L)  Platelets 150 - 400 K/uL 345    RADIOGRAPHIC STUDIES: I have personally reviewed the radiological images as listed and agreed with the findings in the report. CT ABDOMEN PELVIS WO CONTRAST  Result Date: 07/22/2019 CLINICAL DATA:  Sepsis EXAM: CT ABDOMEN AND PELVIS WITHOUT CONTRAST TECHNIQUE: Multidetector CT imaging of the abdomen and pelvis was performed following the standard protocol without IV contrast. COMPARISON:  None. FINDINGS: Lower chest: There is mild cardiomegaly. Mitral valve calcifications. A moderate paraesophageal hernia containing contrast is seen. Hepatobiliary: Although limited due to the lack of intravenous contrast, normal in appearance without gross focal  abnormality. The patient is status post cholecystectomy. No biliary ductal dilation. Pancreas:  Unremarkable.  No surrounding inflammatory changes. Spleen: Normal in size. Although limited due to the lack of intravenous contrast, normal in appearance. Adrenals/Urinary Tract: Both adrenal glands appear normal. The kidneys and collecting system appear normal without evidence of urinary tract calculus or hydronephrosis. Bladder is unremarkable. Stomach/Bowel: The stomach, small bowel, are normal in appearance. There is scattered colonic diverticula without diverticulitis. Vascular/Lymphatic: There are no enlarged abdominal or pelvic lymph nodes. Scattered aortic atherosclerotic calcifications are seen without aneurysmal dilatation. Reproductive: The uterus and adnexa are unremarkable. Other: A small fat and transverse colon containing umbilical hernia seen within the mid abdomen. Musculoskeletal: No acute or significant osseous findings. IMPRESSION: Moderate paraesophageal hernia. Diverticulosis without diverticulitis. No other acute intra-abdominal or pelvic pathology to explain the patient's symptoms. Aortic Atherosclerosis (ICD10-I70.0). Electronically Signed   By: Jonna Clark M.D.   On: 07/22/2019 01:40   DG Chest 1 View  Result Date: 07/27/2019 CLINICAL DATA:  Shortness of breath EXAM: CHEST  1 VIEW COMPARISON:  July 26, 2019 FINDINGS: There is unchanged mild cardiomegaly. There is aortic knob and descending intrathoracic aortic calcifications. There is slight interval improvement in the prominence of the central pulmonary vasculature. No acute osseous abnormality. IMPRESSION: Slight interval improvement in the pulmonary vascular congestion. Electronically Signed   By: Jonna Clark M.D.   On: 07/27/2019 06:04   DG Chest 1 View  Result Date: 07/26/2019 CLINICAL DATA:  Shortness of breath. EXAM: CHEST  1 VIEW COMPARISON:  07/23/2019. FINDINGS: Mediastinum hilar structures normal. Cardiomegaly. Mild bilateral  interstitial prominence and small pleural effusions. CHF could present in this fashion. Sliding hiatal hernia cannot be excluded. IMPRESSION: Cardiomegaly. Mild bilateral interstitial prominence and small pleural effusions. CHF could present in this fashion. Electronically Signed   By: Maisie Fus  Register   On: 07/26/2019 07:52   DG Chest 1 View  Result  Date: 07/23/2019 CLINICAL DATA:  Dyspnea EXAM: CHEST  1 VIEW COMPARISON:  Chest radiograph from one day prior. FINDINGS: Stable cardiomediastinal silhouette with mild cardiomegaly. No pneumothorax. Stable small left pleural effusion. No right pleural effusion. No overt pulmonary edema. Streaky bibasilar lung opacities are similar IMPRESSION: Stable small left pleural effusion. Stable streaky bibasilar lung opacities, favor atelectasis or scarring. Stable mild cardiomegaly without overt pulmonary edema. Electronically Signed   By: Ilona Sorrel M.D.   On: 07/23/2019 04:50   DG Chest 1 View  Result Date: 07/21/2019 CLINICAL DATA:  Sepsis. EXAM: CHEST  1 VIEW COMPARISON:  None. FINDINGS: There are small bilateral pleural effusions. There is a left perihilar density of unknown clinical significance. The heart size is mildly enlarged. There are dense aortic calcifications. There are few streaky airspace opacities at the lung bases favored to represent areas of atelectasis. There is no pneumothorax. IMPRESSION: 1. Small bilateral pleural effusions. 2. Mild cardiomegaly. 3. Slightly prominent left perihilar region may be secondary to a dilated pulmonary artery or lymphadenopathy. A follow-up two-view chest x-ray is recommended in 4-6 weeks. Electronically Signed   By: Constance Holster M.D.   On: 07/21/2019 22:34   DG Chest Port 1 View  Result Date: 07/22/2019 CLINICAL DATA:  Shortness of breath. EXAM: PORTABLE CHEST 1 VIEW COMPARISON:  Jul 21, 2019 FINDINGS: Cardiomegaly. Skin fold over the lateral right chest. The hila and mediastinum are normal. No pneumothorax.  No nodules or masses. No focal infiltrates. IMPRESSION: No active disease. Electronically Signed   By: Dorise Bullion III M.D   On: 07/22/2019 15:12   ECHOCARDIOGRAM COMPLETE  Result Date: 07/27/2019    ECHOCARDIOGRAM REPORT   Patient Name:   ROSLYNN HOLTE Date of Exam: 07/26/2019 Medical Rec #:  [id number]   Height:       64.0 in Accession #:    [id number]  Weight:       164.2 lb Date of Birth:  1930/07/18    BSA:          1.799 m Patient Age:    63 years    BP:           117/65 mmHg Patient Gender: F           HR:           124 bpm. Exam Location:  [facility] Procedure: 2D Echo, Cardiac Doppler and Color Doppler Indications:     Dyspnea 786.09 / R06.00  History:         Patient has no prior history of Echocardiogram examinations.                  CAD; Risk Factors:Hypertension. MI.  Sonographer:     Alyse Low Roar Referring Phys:  [id number] Georgina Quint LATIF [facility] Diagnosing Phys: Kathlyn Sacramento MD IMPRESSIONS  1. Left ventricular ejection fraction, by estimation, is 40 to 45%. The left ventricle has mildly decreased function. The left ventricle demonstrates regional wall motion abnormalities with possible anterior/anteroseptal hypokinesis. There is mild left ventricular hypertrophy. Left ventricular diastolic parameters are indeterminate.  2. Right ventricular systolic function is normal. The right ventricular size is normal. There is mildly elevated pulmonary artery systolic pressure.  3. Left atrial size was severely dilated.  4. Right atrial size was moderately dilated.  5. The mitral valve is abnormal. Moderate mitral valve regurgitation. No evidence of mitral stenosis.  6. The aortic valve is abnormal. Aortic valve regurgitation is moderate. Mild aortic valve sclerosis is present, with no evidence of aortic  valve stenosis. FINDINGS  Left Ventricle: Left ventricular ejection fraction, by estimation, is 40 to 45%. The left ventricle has mildly decreased function. The left ventricle demonstrates regional wall motion  abnormalities. The left ventricular internal cavity size was normal in size. There is mild left ventricular hypertrophy. Left ventricular diastolic parameters are indeterminate. Right Ventricle: The right ventricular size is normal. No increase in right ventricular wall thickness. Right ventricular systolic function is normal. There is mildly elevated pulmonary artery systolic pressure. The tricuspid regurgitant velocity is 2.69  m/s, and with an assumed right atrial pressure of 10 mmHg, the estimated right ventricular systolic pressure is 38.9 mmHg. Left Atrium: Left atrial size was severely dilated. Right Atrium: Right atrial size was moderately dilated. Pericardium: There is no evidence of pericardial effusion. Mitral Valve: The mitral valve is abnormal. There is moderate thickening of the mitral valve leaflet(s). There is mild calcification of the mitral valve leaflet(s). Normal mobility of the mitral valve leaflets. Moderate mitral annular calcification. Moderate mitral valve regurgitation. No evidence of mitral valve stenosis. Tricuspid Valve: The tricuspid valve is normal in structure. Tricuspid valve regurgitation is not demonstrated. No evidence of tricuspid stenosis. Aortic Valve: The aortic valve is abnormal. Aortic valve regurgitation is moderate. Mild aortic valve sclerosis is present, with no evidence of aortic valve stenosis. Aortic valve mean gradient measures 7.5 mmHg. Aortic valve peak gradient measures 14.1 mmHg. Aortic valve area, by VTI measures 1.73 cm. Pulmonic Valve: The pulmonic valve was normal in structure. Pulmonic valve regurgitation is not visualized. No evidence of pulmonic stenosis. Aorta: The aortic root is normal in size and structure. Venous: The inferior vena cava was not well visualized. IAS/Shunts: No atrial level shunt detected by color flow Doppler.  LEFT VENTRICLE PLAX 2D LVIDd:         4.47 cm  Diastology LVIDs:         3.12 cm  LV e' lateral:   6.64 cm/s LV PW:          1.14 cm  LV E/e' lateral: 23.8 LV IVS:        1.06 cm  LV e' medial:    8.81 cm/s LVOT diam:     1.70 cm  LV E/e' medial:  17.9 LV SV:         52 LV SV Index:   29 LVOT Area:     2.27 cm  RIGHT VENTRICLE RV Mid diam:    2.29 cm RV S prime:     12.70 cm/s TAPSE (M-mode): 1.6 cm LEFT ATRIUM             Index       RIGHT ATRIUM           Index LA diam:        4.20 cm 2.33 cm/m  RA Area:     19.00 cm LA Vol (A2C):   92.9 ml 51.64 ml/m RA Volume:   44.60 ml  24.79 ml/m LA Vol (A4C):   82.8 ml 46.02 ml/m LA Biplane Vol: 91.4 ml 50.80 ml/m  AORTIC VALVE                    PULMONIC VALVE AV Area (Vmax):    1.54 cm     PV Vmax:        1.10 m/s AV Area (Vmean):   1.56 cm     PV Peak grad:   4.8 mmHg AV Area (VTI):     1.73 cm  RVOT Peak grad: 2 mmHg AV Vmax:           187.50 cm/s AV Vmean:          123.000 cm/s AV VTI:            0.301 m AV Peak Grad:      14.1 mmHg AV Mean Grad:      7.5 mmHg LVOT Vmax:         127.00 cm/s LVOT Vmean:        84.500 cm/s LVOT VTI:          0.229 m LVOT/AV VTI ratio: 0.76  AORTA Ao Root diam: 2.20 cm MITRAL VALVE                TRICUSPID VALVE MV Area (PHT): 3.16 cm     TR Peak grad:   28.9 mmHg MV Decel Time: 240 msec     TR Vmax:        269.00 cm/s MV E velocity: 158.00 cm/s                             SHUNTS                             Systemic VTI:  0.23 m                             Systemic Diam: 1.70 cm Lorine Bears MD Electronically signed by Lorine Bears MD Signature Date/Time: 07/27/2019/8:13:34 AM    Final     Assessment and plan-   #Acute onset of anemia, Iron panel is not typical for iron deficiency.  Ferritin is 55. Normal vitamin B12 and folate level.-Check MMA-pending Reticulocyte panel shows normal reticulocyte hemoglobin, not typical for iron deficiency.  Patient has increased reticulocytosis which is usually seen in hemolysis or blood loss. LDH is normal.  Haptoglobin is pending.  Bilirubin is normal.  Less likely hemolysis. CT 07/22/2019 showed no  retroperitoneal hematoma She is status post upper endoscopy.  Patient was found to have a large hiatal hernia otherwise no bleeding source was discovered.  There was plan for colonoscopy however per note, patient is noncompliant with bowel prep.  I added reticulocyte panel again to today's CBC.  Now she starts to have decreased reticulocyte hemoglobin level at 22.9.  Consistent with iron deficiency.  Patient has been on oral ferrous sulfate 325 mg twice daily. I recommend IV iron treatments.  Patient has dementia so I called patient's daughter-in-law Amy.  Amy tells me that she has been helping her husband (only child) to manage patient's medical problems. Allergy reactions/infusion reaction including anaphylactic reaction discussed with patient. Other side effects include but not limited to high blood pressure, skin rash, weight gain, leg swelling, etc. Amy voices understanding and willing to proceed. I ordered IV Venofer 200 mg daily x3. Patient will need to follow-up with me outpatient. Eliquis has been resumed this morning and patient is at risk of developing further drop of hemoglobin.  Leukocytosis, likely reactive secondary to UTI. Peripheral blood smear did not show any blast or fragmented RBCs.  Leukoerythroblastic changes. LDH is normal. If leukocytosis is persistently high after UTI resolves, will proceed with additional follow up . She can follow up with me outpatient.   Thank you for allowing me to participate in the care of this patient.  Rickard PatienceZhou Rawson Minix, MD, PhD Hematology Oncology [facility] Pager- [phone] 08/01/2019

## 2019-08-01 NOTE — Progress Notes (Signed)
Initial Nutrition Assessment  DOCUMENTATION CODES:   Not applicable  INTERVENTION:  Boost Breeze po TID, each supplement provides 250 kcal and 9 grams of protein  If unable to advance diet within 24 hrs, recommend consideration of nutrition support  Continue to monitor refeeding labs   NUTRITION DIAGNOSIS:   Inadequate oral intake related to altered GI function(anemia of unknown etiology) as evidenced by (NPO/CLD x 10 days).  GOAL:   Patient will meet greater than or equal to 90% of their needs    MONITOR:   Weight trends, Labs, I & O's, Diet advancement, Supplement acceptance, PO intake  REASON FOR ASSESSMENT:   NPO/Clear Liquid Diet    ASSESSMENT:  RD working remotely.  86 year old female admitted for severe sepsis secondary to E coli UTI after presenting with acute onset of AMS, generalized weakness, diminished po intake, episode of emesis, and low grade fever. Past medical history significant of dementia, CAD, and HTN.  Patient admitted on 07/22/19, hospital stay complicated by slow drop in hemoglobin/hematocrit and volume overload during admission.   CT abdomen pelvis on admission revealed moderate paraesophageal hernia, no other source of anemia identified per hematology  Patient is s/p EGD on 6/08 that revealed large hiatal hernia, otherwise no bleeding source identified. Plans for colonoscopy today, however patient refused bowel prep and unable to be performed today. Per GI, further endoscopic evaluation can be pursued outpatient given absence of active GI bleeding. Decreased reticulocyte hemoglobin noted on CBC, consistent with iron deficiency per hematology, recommendations for IV iron treatments.   Patient is noted pleasantly confused secondary to dementia, unable to obtain nutrition history at this time. Diet history reviewed, pt diet briefly advanced to heart healthy today at 0829, then diet downgraded to clears at 1356 and will be NPO at midnight. Patient has  been  NPO/CLD x 10 days. Per flowsheets, she has been consuming 80-100% of clear liquid meals, however this is insufficient to meet needs. If unable to advance diet within 24 hrs, recommend initiation of nutrition support.   I/Os: +7023 ml since admit      +350 ml x 24 hrs UOP: 850 ml x 24 hrs  Admit wt 159.72 lb         Current wt 170.06 lb Suspect uptrend in weights secondary to fluid status, pt noted volume overloaded, per cardiology improving with careful diuresis, mild pitting BLE edema per RN assessment. Per care everywhere pt weighed 160 lb on 02/27/19, no other past weight history available. Will use admit wt for estimating needs at this time.  Medications reviewed and include: Aricept, Lasix 20 mg po daily, Megace, Melatonin, MVI, Namenda, Klor-con IVPB: Venofer Labs: BNP 312 (H), Hgb 7.2 (L) K/P/Mg - WNL  NUTRITION - FOCUSED PHYSICAL EXAM: Unable to complete at this time, RD working remotely.   Diet Order:   Diet Order            Diet NPO time specified Except for: Ice Chips, Sips with Meds  Diet effective midnight        Diet clear liquid Room service appropriate? Yes; Fluid consistency: Thin  Diet effective now              EDUCATION NEEDS:   No education needs have been identified at this time  Skin:  Skin Assessment: Reviewed RN Assessment  Last BM:  6/09  Height:   Ht Readings from Last 1 Encounters:  07/31/19 5\' 3"  (1.6 m)    Weight:   Wt Readings  from Last 1 Encounters:  08/01/19 77.3 kg     BMI:  Body mass index is 30.19 kg/m.  Estimated Nutritional Needs:   Kcal:  1700-1900  Protein:  85-95  Fluid:  1.7 L/day   Lajuan Lines, RD, LDN Clinical Nutrition After Hours/Weekend Pager # in [place]

## 2019-08-01 NOTE — Progress Notes (Signed)
PROGRESS NOTE    Kimberly Wang  XTK:[id number] DOB: June 13, 1930 DOA: 07/22/2019 PCP: Kimberly Housekeeper, MD   Brief Narrative:  HPI On 07/22/2019 by Dr. Valente David [name] a88 y.o.femalewith a known history of coronary artery disease, hypertension and dementia, who presented to the emergency room with acute onset of altered mental status with generalized weakness and confusion and significant diminish p.o. intake especially today. She vomited once. She was having low-grade fever without chills. She denied any abdominal pain. No significant dysuria, hematuria or urgency or frequency or flank pain. No cough or wheezing or dyspnea.  Upon presentation to the emergency room, temperature was 100.2 and heart rate 101 with otherwise normal vital signs. CMP revealed hyponatremia and hypokalemia as well as hypochloremia with blood glucose of 223, BUN of 18 and creatinine 1.14, albumin of 3.4. Lactic acid was 3.8 and later 3.7. High-sensitivity troponin I was 60. CBC showed leukocytosis of 25.1 with neutrophilia. Urinalysis showed 6-10 WBCs with positive nitrite and trace leukocytes. Blood cultures were drawn and urine culture was sent. Abdominal and pelvic CT scan revealed paraesophageal hernia with no acute findings. Chest x-ray showed bilateral small pleural effusions with mild cardiomegaly.  The patient was given 1 g of p.o. Tylenol, a gram of IV cefepime and a gram of IV vancomycin, 1 L of lactated Ringer, 10 medical IV potassium chloride and 40 mEq p.o. as well as 1 L IV normal saline. She will be admitted to the progressive unit bed for further evaluation and management.  Interim history  Remains pleasantly demented but will need PT OT to further evaluate and treat and now they are recommending SNF. Antibiotics have been changed to p.o.  She was wheezing a little bit her IV fluids have been stopped and she is given a dose of IV Lasix the day before yesterday and again yesterday and  will be given another one today. Cardiology was consulted for concern of CHF and Volume overload. She remains in Atrial Fibrillation but has had some faster heart rates.  Because of concern for GI bleed as her hemoglobin has dropped significantly along with an elevated BUN gastroenterology was consulted and they feel that the patient needs further hematological work-up to determine etiology of her anemia before proceeding with any endoscopic procedure.  Patient is unsure about having endoscopic procedure.  Fecal occult test has been pending.  Her Eliquis has been held for now.  Will need to continue monitor for signs and symptoms of bleeding.  Cardiology she still is not in overt heart failure recommending continuing to carefully diurese and following renal function and symptoms given that she is still 6.5 L positive.  Cardiology recommends discharging from their standpoint on current medications with Lipitor, diltiazem and K-Dur 20 milliequivalents and following up with Kimberly Wang at [facility]  Hematology was consulted given her acute anemia and Kimberly Wang assisting and he recommends checking an MMA which is currently pending.  He is also recommended checking a haptoglobin which is also pending but he feels that hemolysis is less likely.  Because her hemoglobin continues to decrease further he recommends transfusion if it is below 7 and increasing will iron supplementation to twice daily.  He feels that we will need to rule out GI bleeding and cardiology recommending resuming Eliquis 5 mg twice daily as they do not feel that she is bleeding, however with her continuing drop in hemoglobin I do not feel that this is secondary to hydration and will hold until we figure out the  etiology of her blood count being lower   Assessment & Plan   Severe sepsis secondary to E. coli urinary tract infection -Present on admission.  Patient with leukocytosis, tachycardia, tachypnea, elevated lactic acid AMS. -Patient is completed  antibiotic course -She was on IV saline which has been discontinued given that she had dyspnea and wheezing.  She was then given 1 dose of IV Lasix. -Leukocytosis has remained on has been somewhat fluctuating.  Previous hospitalist discussed with Kimberly Wang and pathologist, Kimberly Wang, who reviewed patient's smear and was noted to have no blasts or fragmented to PVCs.  Likely reactive leukoerythroblastic changes blood loss versus hemolysis.  Kimberly Wang states this is likely secondary to UTI -Leukocytosis down to 14.4 today., she need to follow up with hematology  Acute metabolic encephalopathy superimposed on chronic dementia -Patient presented with altered mental status.  Suspect this is secondary to the above -Continue Aricept, memantine, Seroquel -Minimize sedating medications -She is pleasantly confused and cooperative this morning  Atrial fibrillation with RVR -Cardiology consulted and appreciated, recommended continuing diltiazem -Eliquis has been held due to concern of GI bleed as patient's hemoglobin continues to drop -Heart rate stable  Acute on chronic diastolic heart failure -In the setting of mild volume overload given the patient presented with sepsis and was given IV fluids -BNP 125 -Chest x-ray reviewed, showing CHF pattern -Patient was given IV Lasix and has now been transitioned to oral Lasix -she has wheezing and leg edema today, no documented hypoxia at rest, will discuss with cards regarding lasix dose -Continue to monitor intake and output, daily weights  Hyponatremia -Suspect multifactorial including volume depletion, SSRI, anorexia -Patient was on IV fluids however this was discontinued -Zoloft has been held -Continue Megace -Resolved -Continue to monitor BMP  Elevated troponin/demand ischemia -With history of CAD status post PCI in 2006 -Relatively flat, peaked to 64.  Not consistent with ACS.  Suspect demand ischemia. -Continue statin -Eliquis  Held, is not  compliant with colonoscopy prep, colonoscopy is canceled plan to have it done outpatient, resume Eliquis today, monitor sign of bleeding and H&H  Normocytic anemia/possible GI bleed -Was on Eliquis prior to admission as well as oral iron which has been held -Anemia panel showed iron 30, ferritin 55, folate 14.8, vitamin B12 440 -Previous hospitalist discussed with hematology, KimberlyYu.  Not feel that the iron panel is typical for iron deficiency.  Recommended pending reticulocyte panel and obtain LDH and haptoglobin.  LDH was normal.  Increased reticulocytosis can be seen acute GI bleed or hemolysis.  T bilirubin is normal, less likely hemolysis. -Gastroenterology consulted and appreciated -Hemoglobin this morning down to 7.2 -Status post EGD: Large radial hernia, normal duodenal bulb and second portion of duodenum.  Normal stomach biopsied.  Normal gastroesophageal junction and esophagus.   -Eliquis  Held, is not compliant with colonoscopy prep, colonoscopy is canceled plan to have it done outpatient, resume Eliquis today, monitor sign of bleeding and H&H   Essential hypertension -Continue Cardizem, lasix -she is on coreg, spironolactone at home , she is not currently on this, will discuss with cardiology about restarting these meds  Dyslipidemia -LDL 38, continue statin  Elevated BUN -Improved, normalized  Hypomagnesemia/hypokalemia/hypophosphatemia -Resolved with repletion, continue to monitor and replace as needed  Deconditioning/generalized weakness -PT and OT recommending home health  DVT Prophylaxis  SCDs  Code Status: Full code  Family Communication: None at bedside  Disposition Plan:  Status is: Inpatient  Remains inpatient appropriate because:Inpatient level of care appropriate due  to severity of illness.  Continues to have anemia,  hemoglobin to 7.2 today. Restarting eliquis, needs to monitor hgb signs of bleeding Needs to adjust meds as she has wheezing and  edema.   Dispo: The patient is from: Home              Anticipated d/c is to: SNF vs [facility] with [facility]              Anticipated d/c date is:1- 2 days needs cards clearance              Patient currently is not medically stable to d/c.  Consultants Cardiology Gastroenterology Hematology  Procedures  EGD  Antibiotics   Anti-infectives (From admission, onward)   Start     Dose/Rate Route Frequency Ordered Stop   07/26/19 0600  cephALEXin (KEFLEX) capsule 500 mg     500 mg Oral Every 6 hours 07/25/19 1718 07/29/19 0559   07/22/19 0900  cefTRIAXone (ROCEPHIN) 1 g in sodium chloride 0.9 % 100 mL IVPB  Status:  Discontinued     1 g 200 mL/hr over 30 Minutes Intravenous Every 24 hours 07/22/19 0248 07/25/19 1714   07/22/19 0145  vancomycin (VANCOCIN) IVPB 1000 mg/200 mL premix     1,000 mg 200 mL/hr over 60 Minutes Intravenous  Once 07/22/19 0144 07/22/19 0349   07/22/19 0045  ceFEPIme (MAXIPIME) 1 g in sodium chloride 0.9 % 100 mL IVPB     1 g 200 mL/hr over 30 Minutes Intravenous  Once 07/22/19 0039 07/22/19 0202      Subjective:   Kimberly Wang seen and examined today.  Patient with no complaints other than she feels hungry this morning.  She is pleasantly confused, but cooperative, she denies pain. Audible wheezing, intermittent cough noticed during encounter, no hypoxia at rest Objective:   Vitals:   07/31/19 1729 07/31/19 1919 08/01/19 0653 08/01/19 0728  BP: 125/83 140/82 138/64 116/76  Pulse: 78 80 81 (!) 108  Resp: 18 20 (!) 22 17  Temp: 97.8 F (36.6 C) (!) 97.4 F (36.3 C) 98.4 F (36.9 C) 98 F (36.7 C)  TempSrc:  Oral Oral Oral  SpO2: 99% 100% 97% 97%  Weight:   77.3 kg   Height:        Intake/Output Summary (Last 24 hours) at 08/01/2019 0903 Last data filed at 08/01/2019 2637 Gross per 24 hour  Intake 1200 ml  Output 850 ml  Net 350 ml   Filed Weights   07/31/19 0522 07/31/19 1001 08/01/19 0653  Weight: 77.2 kg 77.2 kg 77.3 kg    Exam  General:  pleasantly confused elderly, chronically ill-appearing, NAD  HEENT: NCAT,  mucous membranes moist.   Cardiovascular: S1 S2 auscultated, irregular  Respiratory: mild bilateral wheezing   Abdomen: Soft, nontender, nondistended, + bowel sounds  Extremities: warm dry without cyanosis clubbing. +++ lower extremity edema.  Neuro: AAOx2 (person, place), has short-term memory deficits.  Nonfocal  Skin: Without rashes exudates or nodules  Psych: Pleasantly confused   Data Reviewed: I have personally reviewed following labs and imaging studies  CBC: Recent Labs  Lab 07/29/19 0731 07/29/19 0856 07/30/19 0700 07/31/19 0505 08/01/19 0643  WBC 14.2* 15.9* 17.1* 12.5* 14.3*  NEUTROABS 11.1* 12.4* 13.2* 9.8* 11.7*  HGB 7.3* 7.7* 8.0* 7.2* 7.2*  HCT 23.2* 24.2* 25.1* 22.4* 23.0*  MCV 97.9 95.7 96.9 94.9 97.0  PLT 292 325 406* 329 345   Basic Metabolic Panel: Recent Labs  Lab 07/27/19 0447 07/27/19  1448 07/28/19 0454 07/28/19 0454 07/29/19 0731 07/29/19 0856 07/30/19 0700 07/31/19 0505 08/01/19 0643  NA 137  --  137  --   --  135 136  --  138  K 4.3  --  4.6  --   --  4.5 4.6  --  4.8  CL 105  --  108  --   --  105 104  --  107  CO2 24  --  24  --   --  22 23  --  24  GLUCOSE 134*  --  118*  --   --  105* 119*  --  103*  BUN 34*  --  23  --   --  19 18  --  16  CREATININE 0.80  --  0.82  --   --  0.73 0.80  --  0.87  CALCIUM 8.6*  --  8.6*  --   --  8.6* 8.6*  --  8.4*  MG 2.1   < > 2.1   < > 2.3 2.2 2.3 2.4 2.4  PHOS 2.9   < > 3.3   < > 3.3 3.3 3.5 3.6 3.6   < > = values in this interval not displayed.   GFR: Estimated Creatinine Clearance: 44.9 mL/min (by C-G formula based on SCr of 0.87 mg/dL). Liver Function Tests: Recent Labs  Lab 07/26/19 0443 07/27/19 0447 07/28/19 0454 07/29/19 0856 07/30/19 0700  AST 26 27 36 31 33  ALT 30 30 42 41 41  ALKPHOS 60 63 69 69 72  BILITOT 0.4 0.7 0.5 0.8 0.6  PROT 4.9* 5.0* 5.2* 5.5* 5.6*  ALBUMIN 2.3* 2.3* 2.4* 2.7* 2.7*    No results for input(s): LIPASE, AMYLASE in the last 168 hours. No results for input(s): AMMONIA in the last 168 hours. Coagulation Profile: Recent Labs  Lab 07/27/19 1531  INR 1.5*   Cardiac Enzymes: No results for input(s): CKTOTAL, CKMB, CKMBINDEX, TROPONINI in the last 168 hours. BNP (last 3 results) No results for input(s): PROBNP in the last 8760 hours. HbA1C: No results for input(s): HGBA1C in the last 72 hours. CBG: Recent Labs  Lab 07/31/19 1922  GLUCAP 135*   Lipid Profile: No results for input(s): CHOL, HDL, LDLCALC, TRIG, CHOLHDL, LDLDIRECT in the last 72 hours. Thyroid Function Tests: No results for input(s): TSH, T4TOTAL, FREET4, T3FREE, THYROIDAB in the last 72 hours. Anemia Panel: No results for input(s): VITAMINB12, FOLATE, FERRITIN, TIBC, IRON, RETICCTPCT in the last 72 hours. Urine analysis:    Component Value Date/Time   COLORURINE YELLOW (A) 07/22/2019 0054   APPEARANCEUR HAZY (A) 07/22/2019 0054   LABSPEC 1.012 07/22/2019 0054   PHURINE 5.0 07/22/2019 0054   GLUCOSEU NEGATIVE 07/22/2019 0054   HGBUR MODERATE (A) 07/22/2019 0054   BILIRUBINUR NEGATIVE 07/22/2019 0054   KETONESUR NEGATIVE 07/22/2019 0054   PROTEINUR NEGATIVE 07/22/2019 0054   NITRITE POSITIVE (A) 07/22/2019 0054   LEUKOCYTESUR TRACE (A) 07/22/2019 0054   Sepsis Labs: @LABRCNTIP (procalcitonin:4,lacticidven:4)  ) No results found for this or any previous visit (from the past 240 hour(s)).    Radiology Studies: No results found.   Scheduled Meds: . apixaban  5 mg Oral BID  . atorvastatin  80 mg Oral Daily  . diltiazem  240 mg Oral Daily  . donepezil  10 mg Oral QHS  . furosemide  20 mg Oral Daily  . megestrol  400 mg Oral Daily  . melatonin  5 mg Oral Once  . memantine  10  mg Oral Daily  . multivitamin with minerals  1 tablet Oral Daily  . pantoprazole (PROTONIX) IV  40 mg Intravenous Q12H  . potassium chloride SA  20 mEq Oral Daily   Continuous Infusions:     LOS: 10 days   Time Spent in minutes   45 minutes  Albertine Grates D.O. on 08/01/2019 at 9:03 AM  Between 7am to 7pm - Please see pager noted on [email or web address]  After 7pm go to [email or web address]  And look for the night coverage person covering for me after hours  [facility] Office  [phone]

## 2019-08-02 LAB — BASIC METABOLIC PANEL
Anion gap: 8 (ref 5–15)
BUN: 21 mg/dL (ref 8–23)
CO2: 22 mmol/L (ref 22–32)
Calcium: 8 mg/dL — ABNORMAL LOW (ref 8.9–10.3)
Chloride: 108 mmol/L (ref 98–111)
Creatinine, Ser: 0.84 mg/dL (ref 0.44–1.00)
GFR calc Af Amer: >60 mL/min (ref >60)
GFR calc non Af Amer: >60 mL/min (ref >60)
Glucose, Bld: 90 mg/dL (ref 70–99)
Potassium: 4.6 mmol/L (ref 3.5–5.1)
Sodium: 138 mmol/L (ref 135–145)

## 2019-08-02 LAB — CBC WITH DIFFERENTIAL/PLATELET
Abs Immature Granulocytes: 0.12 10*3/uL — ABNORMAL HIGH (ref 0.00–0.07)
Basophils Absolute: 0 10*3/uL (ref 0.0–0.1)
Basophils Relative: 0 %
Eosinophils Absolute: 0.1 10*3/uL (ref 0.0–0.5)
Eosinophils Relative: 0 %
HCT: 22.6 % — ABNORMAL LOW (ref 36.0–46.0)
Hemoglobin: 7 g/dL — ABNORMAL LOW (ref 12.0–15.0)
Immature Granulocytes: 1 %
Lymphocytes Relative: 10 %
Lymphs Abs: 1.5 10*3/uL (ref 0.7–4.0)
MCH: 30 pg (ref 26.0–34.0)
MCHC: 31 g/dL (ref 30.0–36.0)
MCV: 97 fL (ref 80.0–100.0)
Monocytes Absolute: 1 10*3/uL (ref 0.1–1.0)
Monocytes Relative: 7 %
Neutro Abs: 11.7 10*3/uL — ABNORMAL HIGH (ref 1.7–7.7)
Neutrophils Relative %: 82 %
Platelets: 313 10*3/uL (ref 150–400)
RBC: 2.33 MIL/uL — ABNORMAL LOW (ref 3.87–5.11)
RDW: 16 % — ABNORMAL HIGH (ref 11.5–15.5)
WBC: 14.4 10*3/uL — ABNORMAL HIGH (ref 4.0–10.5)
nRBC: 0.5 % — ABNORMAL HIGH (ref 0.0–0.2)

## 2019-08-02 LAB — MAGNESIUM: Magnesium: 2.6 mg/dL — ABNORMAL HIGH (ref 1.7–2.4)

## 2019-08-02 MED ORDER — POLYETHYLENE GLYCOL 3350 17 GM/SCOOP PO POWD
1.0000 | Freq: Once | ORAL | Status: AC
  Administered 2019-08-02: 255 g via ORAL
  Filled 2019-08-02: qty 255

## 2019-08-02 MED ORDER — SERTRALINE HCL 50 MG PO TABS
50.0000 mg | ORAL_TABLET | Freq: Every day | ORAL | Status: DC
  Administered 2019-08-02 – 2019-08-09 (×8): 50 mg via ORAL
  Filled 2019-08-02 (×8): qty 1

## 2019-08-02 NOTE — Progress Notes (Signed)
Occupational Therapy Treatment Patient Details Name: Kimberly Wang MRN: [id number] DOB: 1930/12/10 Today's Date: 08/02/2019    History of present illness Patient is an 86 year old female who presented to the emergency room with acute onset of altered mental status with generalized weakness and confusion and significant diminish p.o. intake, vomiting. Chest x-ray showed bilateral small pleural effusions with mild cardiomegaly. Found to have spesis/UTI. PMH includes Parkinson's dementia, CAD, HTN, MI.    OT comments  Kimberly Wang presents to OT pleasantly confused, asking for food/stating she is hungry.  Pt is on clear liquid diet for upcoming procedure.  OTR confirmed with nursing and provided pt with drink.  OTR provided setup assist for pt to self feed and complete self care tasks while in recliner.  OOB mobility deferred 2/2 pt's increased HR and low Hgb.  Pt continues to be primarily limited by confusion and poor endurance.  Pt has shortness of breath with any activity, including bending down to reach objects on floor (SpO2 stable in high 90s).  OTR provided re-orientation interventions, including orienting pt to date and time.  Pt verbalizes understanding, but has short term memory impairments.  Pt frequently asked OTR for food throughout short session despite OTR educating pt on her diet restrictions.  OTR provided assist to elevate pt's legs with recliner and pillow as pt appears to have swelling in BLEs.  Kimberly Wang will continue to benefit from skilled OT services to address cognition, endurance, and safety and independence in ADLs.  SNF remains most appropriate discharge recommendation 2/2 pt's impaired cognition and poor endurance with functional mobility.   Follow Up Recommendations  Supervision/Assistance - 24 hour;SNF    Equipment Recommendations  3 in 1 bedside commode    Recommendations for Other Services      Precautions / Restrictions Precautions Precautions: Fall Restrictions Weight  Bearing Restrictions: No       Mobility Bed Mobility Overal bed mobility: Needs Assistance             General bed mobility comments: not assessed this session as patient sitting up in chair pre and post session   Transfers Overall transfer level: Needs assistance Equipment used: 1 person hand held assist Transfers: Sit to/from Stand Sit to Stand: Min assist Stand pivot transfers: Min assist       General transfer comment: Not assessed this date 2/2 increased HR and low Hgb    Balance Overall balance assessment: Needs assistance Sitting-balance support: Feet supported;No upper extremity supported Sitting balance-Leahy Scale: Good                                     ADL either performed or assessed with clinical judgement   ADL Overall ADL's : Needs assistance/impaired                                       General ADL Comments: Pt is generally independent in seated ADLs including grooming, feeding, upper body dressing and bathing.  Pt requires supervision for standing ADLs including toileting and lower body dressing and bathing.  Pt generally requires supervision for OOB mobility 2/2 decreased safety awareness and impaired cognition.     Vision Baseline Vision/History: Wears glasses Wears Glasses: Reading only Patient Visual Report: No change from baseline Vision Assessment?: No apparent visual deficits   Perception  Praxis      Cognition Arousal/Alertness: Awake/alert Behavior During Therapy: WFL for tasks assessed/performed Overall Cognitive Status: No family/caregiver present to determine baseline cognitive functioning                                 General Comments: Pt is pleasantly confused and disoriented about recent events, time, and situation.  Pt follows single step commands without difficulty.  Pt with decreased short term memory, often repeating questions and stories.        Exercises Other  Exercises Other Exercises: provided setup assist for seated self care and feeding, interventions to increase pt orientation   Shoulder Instructions       General Comments      Pertinent Vitals/ Pain       Pain Assessment: No/denies pain  Home Living                                          Prior Functioning/Environment              Frequency  Min 1X/week        Progress Toward Goals  OT Goals(current goals can now be found in the care plan section)  Progress towards OT goals: Progressing toward goals  Acute Rehab OT Goals Patient Stated Goal: to return to prior level of function  OT Goal Formulation: With patient Time For Goal Achievement: 08/08/19 Potential to Achieve Goals: Good  Plan      Co-evaluation                 AM-PAC OT "6 Clicks" Daily Activity     Outcome Measure   Help from another person eating meals?: None Help from another person taking care of personal grooming?: None Help from another person toileting, which includes using toliet, bedpan, or urinal?: A Little Help from another person bathing (including washing, rinsing, drying)?: A Little Help from another person to put on and taking off regular upper body clothing?: None Help from another person to put on and taking off regular lower body clothing?: A Lot 6 Click Score: 20    End of Session    OT Visit Diagnosis: Unsteadiness on feet (R26.81);Other symptoms and signs involving cognitive function   Activity Tolerance Patient tolerated treatment well   Patient Left in chair;with call bell/phone within reach;with chair alarm set   Nurse Communication          Time: 3762-8315 OT Time Calculation (min): 11 min  Charges: OT General Charges $OT Visit: 1 Visit OT Treatments $Self Care/Home Management : 8-22 mins  Kimberly Wang, OTR/L 08/02/19, 2:11 PM

## 2019-08-02 NOTE — Progress Notes (Signed)
PROGRESS NOTE    Kimberly Wang  XTK:[id number] DOB: June 13, 1930 DOA: 07/22/2019 PCP: [name], MD   Brief Narrative:  HPI On 07/22/2019 by Dr. Valente David Kimberly Wang a88 y.o.femalewith a known history of coronary artery disease, hypertension and dementia, who presented to the emergency room with acute onset of altered mental status with generalized weakness and confusion and significant diminish p.o. intake especially today. She vomited once. She was having low-grade fever without chills. She denied any abdominal pain. No significant dysuria, hematuria or urgency or frequency or flank pain. No cough or wheezing or dyspnea.  Upon presentation to the emergency room, temperature was 100.2 and heart rate 101 with otherwise normal vital signs. CMP revealed hyponatremia and hypokalemia as well as hypochloremia with blood glucose of 223, BUN of 18 and creatinine 1.14, albumin of 3.4. Lactic acid was 3.8 and later 3.7. High-sensitivity troponin I was 60. CBC showed leukocytosis of 25.1 with neutrophilia. Urinalysis showed 6-10 WBCs with positive nitrite and trace leukocytes. Blood cultures were drawn and urine culture was sent. Abdominal and pelvic CT scan revealed paraesophageal hernia with no acute findings. Chest x-ray showed bilateral small pleural effusions with mild cardiomegaly.  The patient was given 1 g of p.o. Tylenol, a gram of IV cefepime and a gram of IV vancomycin, 1 L of lactated Ringer, 10 medical IV potassium chloride and 40 mEq p.o. as well as 1 L IV normal saline. She will be admitted to the progressive unit bed for further evaluation and management.  Interim history  Remains pleasantly demented but will need PT OT to further evaluate and treat and now they are recommending SNF. Antibiotics have been changed to p.o.  She was wheezing a little bit her IV fluids have been stopped and she is given a dose of IV Lasix the day before yesterday and again yesterday and  will be given another one today. Cardiology was consulted for concern of CHF and Volume overload. She remains in Atrial Fibrillation but has had some faster heart rates.  Because of concern for GI bleed as her hemoglobin has dropped significantly along with an elevated BUN gastroenterology was consulted and they feel that the patient needs further hematological work-up to determine etiology of her anemia before proceeding with any endoscopic procedure.  Patient is unsure about having endoscopic procedure.  Fecal occult test has been pending.  Her Eliquis has been held for now.  Will need to continue monitor for signs and symptoms of bleeding.  Cardiology she still is not in overt heart failure recommending continuing to carefully diurese and following renal function and symptoms given that she is still 6.5 L positive.  Cardiology recommends discharging from their standpoint on current medications with Lipitor, diltiazem and K-Dur 20 milliequivalents and following up with Jonelle Sidle at [facility]  Hematology was consulted given her acute anemia and Dr. Cathie Hoops assisting and he recommends checking an MMA which is currently pending.  He is also recommended checking a haptoglobin which is also pending but he feels that hemolysis is less likely.  Because her hemoglobin continues to decrease further he recommends transfusion if it is below 7 and increasing will iron supplementation to twice daily.  He feels that we will need to rule out GI bleeding and cardiology recommending resuming Eliquis 5 mg twice daily as they do not feel that she is bleeding, however with her continuing drop in hemoglobin I do not feel that this is secondary to hydration and will hold until we figure out the  etiology of her blood count being lower   Assessment & Plan   Severe sepsis secondary to E. coli urinary tract infection -Present on admission.  Patient with leukocytosis, tachycardia, tachypnea, elevated lactic acid AMS. -Patient is completed  antibiotic course -She was on IV saline which has been discontinued given that she had dyspnea and wheezing.  She was then given 1 dose of IV Lasix. -Leukocytosis has remained on has been somewhat fluctuating.  Previous hospitalist discussed with Dr. Cathie Hoops and pathologist, Dr. Burgess Estelle, who reviewed patient's smear and was noted to have no blasts or fragmented to PVCs.  Likely reactive leukoerythroblastic changes blood loss versus hemolysis.  Dr. Cathie Hoops states this is likely secondary to UTI -Leukocytosis down to 14.4 today., she need to follow up with hematology  Acute metabolic encephalopathy superimposed on chronic dementia -Patient presented with altered mental status.  Suspect this is secondary to the above -Continue Aricept, memantine, Seroquel -Minimize sedating medications -She is pleasantly confused and cooperative this morning  Atrial fibrillation with RVR -Cardiology consulted and appreciated, recommended continuing diltiazem -Eliquis has been held due to concern of GI bleed as patient's hemoglobin continues to drop -Heart rate stable  Acute on chronic diastolic heart failure -In the setting of mild volume overload given the patient presented with sepsis and was given IV fluids -BNP 125 -Chest x-ray reviewed, showing CHF pattern -Patient was given IV Lasix and has now been transitioned to oral Lasix -she has wheezing and leg edema today, no documented hypoxia at rest, will discuss with cards regarding lasix dose -Continue to monitor intake and output, daily weights  Hyponatremia -Suspect multifactorial including volume depletion, SSRI, anorexia -Patient was on IV fluids however this was discontinued -Zoloft has been held -Continue Megace -Resolved -Continue to monitor BMP  Elevated troponin/demand ischemia -With history of CAD status post PCI in 2006 -Relatively flat, peaked to 64.  Not consistent with ACS.  Suspect demand ischemia. -Continue statin -Eliquis  Held, is not  compliant with colonoscopy prep, colonoscopy is canceled plan to have it done outpatient, resume Eliquis today, monitor sign of bleeding and H&H  Normocytic anemia/possible GI bleed -Was on Eliquis prior to admission as well as oral iron which has been held -Anemia panel showed iron 30, ferritin 55, folate 14.8, vitamin B12 440 -Previous hospitalist discussed with hematology, Dr.Yu.  Not feel that the iron panel is typical for iron deficiency.  Recommended pending reticulocyte panel and obtain LDH and haptoglobin.  LDH was normal.  Increased reticulocytosis can be seen acute GI bleed or hemolysis.  T bilirubin is normal, less likely hemolysis. -Gastroenterology consulted and appreciated -Hemoglobin this morning down to 7.2 -Status post EGD: Large radial hernia, normal duodenal bulb and second portion of duodenum.  Normal stomach biopsied.  Normal gastroesophageal junction and esophagus.   -Eliquis  Held, is not compliant with colonoscopy prep, -Family insist to have colonoscopy done in the hospital, Eliquis held, inpatient colonoscopy per GI  Essential hypertension -Continue Cardizem, lasix -she is on coreg, spironolactone at home , she is not currently on this, will discuss with cardiology about restarting these meds  Dyslipidemia -LDL 38, continue statin  Elevated BUN -Improved, normalized  Hypomagnesemia/hypokalemia/hypophosphatemia -Resolved with repletion, continue to monitor and replace as needed  Deconditioning/generalized weakness -PT and OT recommending home health  DVT Prophylaxis  SCDs  Code Status: Full code  Family Communication: daughter AMY at  bedside  Disposition Plan:  Status is: Inpatient  Remains inpatient appropriate because:Inpatient level of care appropriate due to severity  of illness.  Continues to have anemia,  hemoglobin to 7.2 today. Restarting eliquis, needs to monitor hgb signs of bleeding Needs to adjust meds as she has wheezing and  edema.   Dispo: The patient is from: Home              Anticipated d/c is to: SNF vs Home with [facility]              Anticipated d/c date is:1- 2 days needs cards and GI clearance, need colonoscopy              Patient currently is not medically stable to d/c.  Consultants Cardiology Gastroenterology Hematology  Procedures  EGD  Antibiotics   Anti-infectives (From admission, onward)   Start     Dose/Rate Route Frequency Ordered Stop   07/26/19 0600  cephALEXin (KEFLEX) capsule 500 mg        500 mg Oral Every 6 hours 07/25/19 1718 07/29/19 0559   07/22/19 0900  cefTRIAXone (ROCEPHIN) 1 g in sodium chloride 0.9 % 100 mL IVPB  Status:  Discontinued        1 g 200 mL/hr over 30 Minutes Intravenous Every 24 hours 07/22/19 0248 07/25/19 1714   07/22/19 0145  vancomycin (VANCOCIN) IVPB 1000 mg/200 mL premix        1,000 mg 200 mL/hr over 60 Minutes Intravenous  Once 07/22/19 0144 07/22/19 0349   07/22/19 0045  ceFEPIme (MAXIPIME) 1 g in sodium chloride 0.9 % 100 mL IVPB        1 g 200 mL/hr over 30 Minutes Intravenous  Once 07/22/19 0039 07/22/19 0202      Subjective:   Khrystyne Cratty seen and examined today.  Patient with no complaints , she is sitting up in chair, she is pleasantly confused No wheezing, no cough today Daughter Amy at bedside Objective:   Vitals:   08/01/19 1604 08/01/19 1935 08/02/19 0627 08/02/19 0737  BP: (!) 122/46 (!) 112/52 (!) 119/56 135/90  Pulse: 61 (!) 54 (!) 58 (!) 109  Resp: 18 16 17 18   Temp: 98.7 F (37.1 C) 98.2 F (36.8 C) 98.4 F (36.9 C) 98.2 F (36.8 C)  TempSrc: Oral Oral Oral Oral  SpO2: 100% 99% 99% 99%  Weight:      Height:        Intake/Output Summary (Last 24 hours) at 08/02/2019 0846 Last data filed at 08/01/2019 1633 Gross per 24 hour  Intake 240 ml  Output 750 ml  Net -510 ml   Filed Weights   07/31/19 0522 07/31/19 1001 08/01/19 0653  Weight: 77.2 kg 77.2 kg 77.3 kg    Exam  General: pleasantly confused elderly,  chronically ill-appearing, NAD  HEENT: NCAT,  mucous membranes moist.   Cardiovascular: S1 S2 auscultated, irregular  Respiratory: Overall diminished, no wheezing today  Abdomen: Soft, nontender, nondistended, + bowel sounds  Extremities: warm dry without cyanosis clubbing. +++ lower extremity edema.  Neuro: AAOx2 (person, place), has short-term memory deficits.  Nonfocal  Skin: Without rashes exudates or nodules  Psych: Pleasantly confused   Data Reviewed: I have personally reviewed following labs and imaging studies  CBC: Recent Labs  Lab 07/29/19 0856 07/30/19 0700 07/31/19 0505 08/01/19 0643 08/02/19 0549  WBC 15.9* 17.1* 12.5* 14.3* 14.4*  NEUTROABS 12.4* 13.2* 9.8* 11.7* 11.7*  HGB 7.7* 8.0* 7.2* 7.2* 7.0*  HCT 24.2* 25.1* 22.4* 23.0* 22.6*  MCV 95.7 96.9 94.9 97.0 97.0  PLT 325 406* 329 345 313   Basic  Metabolic Panel: Recent Labs  Lab 07/28/19 0454 07/28/19 0454 07/29/19 0731 07/29/19 0731 07/29/19 0856 07/30/19 0700 07/31/19 0505 08/01/19 0643 08/02/19 0549  NA 137  --   --   --  135 136  --  138 138  K 4.6  --   --   --  4.5 4.6  --  4.8 4.6  CL 108  --   --   --  105 104  --  107 108  CO2 24  --   --   --  22 23  --  24 22  GLUCOSE 118*  --   --   --  105* 119*  --  103* 90  BUN 23  --   --   --  19 18  --  16 21  CREATININE 0.82  --   --   --  0.73 0.80  --  0.87 0.84  CALCIUM 8.6*  --   --   --  8.6* 8.6*  --  8.4* 8.0*  MG 2.1   < > 2.3   < > 2.2 2.3 2.4 2.4 2.6*  PHOS 3.3   < > 3.3  --  3.3 3.5 3.6 3.6  --    < > = values in this interval not displayed.   GFR: Estimated Creatinine Clearance: 46.5 mL/min (by C-G formula based on SCr of 0.84 mg/dL). Liver Function Tests: Recent Labs  Lab 07/27/19 0447 07/28/19 0454 07/29/19 0856 07/30/19 0700  AST 27 36 31 33  ALT 30 42 41 41  ALKPHOS 63 69 69 72  BILITOT 0.7 0.5 0.8 0.6  PROT 5.0* 5.2* 5.5* 5.6*  ALBUMIN 2.3* 2.4* 2.7* 2.7*   No results for input(s): LIPASE, AMYLASE in the last  168 hours. No results for input(s): AMMONIA in the last 168 hours. Coagulation Profile: Recent Labs  Lab 07/27/19 1531  INR 1.5*   Cardiac Enzymes: No results for input(s): CKTOTAL, CKMB, CKMBINDEX, TROPONINI in the last 168 hours. BNP (last 3 results) No results for input(s): PROBNP in the last 8760 hours. HbA1C: No results for input(s): HGBA1C in the last 72 hours. CBG: Recent Labs  Lab 07/31/19 1922  GLUCAP 135*   Lipid Profile: No results for input(s): CHOL, HDL, LDLCALC, TRIG, CHOLHDL, LDLDIRECT in the last 72 hours. Thyroid Function Tests: No results for input(s): TSH, T4TOTAL, FREET4, T3FREE, THYROIDAB in the last 72 hours. Anemia Panel: Recent Labs    08/01/19 1225  RETICCTPCT 7.4*   Urine analysis:    Component Value Date/Time   COLORURINE YELLOW (A) 07/22/2019 0054   APPEARANCEUR HAZY (A) 07/22/2019 0054   LABSPEC 1.012 07/22/2019 0054   PHURINE 5.0 07/22/2019 0054   GLUCOSEU NEGATIVE 07/22/2019 0054   HGBUR MODERATE (A) 07/22/2019 0054   BILIRUBINUR NEGATIVE 07/22/2019 0054   KETONESUR NEGATIVE 07/22/2019 0054   PROTEINUR NEGATIVE 07/22/2019 0054   NITRITE POSITIVE (A) 07/22/2019 0054   LEUKOCYTESUR TRACE (A) 07/22/2019 0054   Sepsis Labs: @LABRCNTIP (procalcitonin:4,lacticidven:4)  ) No results found for this or any previous visit (from the past 240 hour(s)).    Radiology Studies: No results found.   Scheduled Meds: . atorvastatin  80 mg Oral Daily  . diltiazem  240 mg Oral Daily  . donepezil  10 mg Oral QHS  . feeding supplement  1 Container Oral TID BM  . furosemide  20 mg Oral Daily  . megestrol  400 mg Oral Daily  . melatonin  5 mg Oral Once  . memantine  10 mg Oral Daily  . multivitamin with minerals  1 tablet Oral Daily  . pantoprazole (PROTONIX) IV  40 mg Intravenous Q12H  . potassium chloride SA  20 mEq Oral Daily   Continuous Infusions: . iron sucrose 200 mg (08/02/19 0835)     LOS: 11 days   Time Spent in minutes   45  minutes  Albertine Grates MD PhD FACPon 08/02/2019 at 8:46 AM  Between 7am to 7pm - Please see pager noted on [email or web address]  After 7pm go to [email or web address]  And look for the night coverage person covering for me after hours  [facility] Office  [phone]

## 2019-08-02 NOTE — Care Management Important Message (Signed)
Important Message  Patient Details  Name: Kimberly Wang MRN: [id number] Date of Birth: [birth year]   Medicare Important Message Given:  Yes     [name] 08/02/2019, 2:30 PM

## 2019-08-03 ENCOUNTER — Ambulatory Visit: Admit: 2019-08-03 | Payer: Medicare PPO | Admitting: Gastroenterology

## 2019-08-03 ENCOUNTER — Encounter
Admission: EM | Disposition: A | Discharge status: Skilled Nursing Facility | Payer: Self-pay | Source: Home / Self Care | Attending: Internal Medicine

## 2019-08-03 ENCOUNTER — Inpatient Hospital Stay: Payer: Medicare PPO

## 2019-08-03 DIAGNOSIS — D638 Anemia in other chronic diseases classified elsewhere: Secondary | ICD-10-CM

## 2019-08-03 DIAGNOSIS — R0602 Shortness of breath: Secondary | ICD-10-CM

## 2019-08-03 LAB — BASIC METABOLIC PANEL
Anion gap: 7 (ref 5–15)
BUN: 17 mg/dL (ref 8–23)
CO2: 21 mmol/L — ABNORMAL LOW (ref 22–32)
Calcium: 7.8 mg/dL — ABNORMAL LOW (ref 8.9–10.3)
Chloride: 106 mmol/L (ref 98–111)
Creatinine, Ser: 1 mg/dL (ref 0.44–1.00)
GFR calc Af Amer: 59 mL/min — ABNORMAL LOW (ref >60)
GFR calc non Af Amer: 51 mL/min — ABNORMAL LOW (ref >60)
Glucose, Bld: 103 mg/dL — ABNORMAL HIGH (ref 70–99)
Potassium: 5 mmol/L (ref 3.5–5.1)
Sodium: 134 mmol/L — ABNORMAL LOW (ref 135–145)

## 2019-08-03 LAB — CBC WITH DIFFERENTIAL/PLATELET
Abs Immature Granulocytes: 0.16 10*3/uL — ABNORMAL HIGH (ref 0.00–0.07)
Basophils Absolute: 0.1 10*3/uL (ref 0.0–0.1)
Basophils Relative: 0 %
Eosinophils Absolute: 0 10*3/uL (ref 0.0–0.5)
Eosinophils Relative: 0 %
HCT: 20.7 % — ABNORMAL LOW (ref 36.0–46.0)
Hemoglobin: 6.6 g/dL — ABNORMAL LOW (ref 12.0–15.0)
Immature Granulocytes: 1 %
Lymphocytes Relative: 9 %
Lymphs Abs: 1.7 10*3/uL (ref 0.7–4.0)
MCH: 30.1 pg (ref 26.0–34.0)
MCHC: 31.9 g/dL (ref 30.0–36.0)
MCV: 94.5 fL (ref 80.0–100.0)
Monocytes Absolute: 0.8 10*3/uL (ref 0.1–1.0)
Monocytes Relative: 5 %
Neutro Abs: 14.9 10*3/uL — ABNORMAL HIGH (ref 1.7–7.7)
Neutrophils Relative %: 85 %
Platelets: 390 10*3/uL (ref 150–400)
RBC: 2.19 MIL/uL — ABNORMAL LOW (ref 3.87–5.11)
RDW: 16.3 % — ABNORMAL HIGH (ref 11.5–15.5)
WBC: 17.7 10*3/uL — ABNORMAL HIGH (ref 4.0–10.5)
nRBC: 0.6 % — ABNORMAL HIGH (ref 0.0–0.2)

## 2019-08-03 LAB — LACTATE DEHYDROGENASE: LDH: 222 U/L — ABNORMAL HIGH (ref 98–192)

## 2019-08-03 LAB — DAT, POLYSPECIFIC AHG (ARMC ONLY): Polyspecific AHG test: NEGATIVE

## 2019-08-03 LAB — METHYLMALONIC ACID, SERUM: Methylmalonic Acid, Quantitative: 201 nmol/L (ref 0–378)

## 2019-08-03 LAB — MAGNESIUM: Magnesium: 2.9 mg/dL — ABNORMAL HIGH (ref 1.7–2.4)

## 2019-08-03 LAB — PREPARE RBC (CROSSMATCH)

## 2019-08-03 SURGERY — COLONOSCOPY WITH PROPOFOL
Anesthesia: General

## 2019-08-03 MED ORDER — SODIUM CHLORIDE 0.9% IV SOLUTION: Freq: Once | INTRAVENOUS | Status: AC

## 2019-08-03 MED ORDER — FUROSEMIDE 10 MG/ML IJ SOLN
40.0000 mg | Freq: Every day | INTRAMUSCULAR | Status: DC
  Administered 2019-08-03 – 2019-08-06 (×4): 40 mg via INTRAVENOUS
  Filled 2019-08-03 (×4): qty 4

## 2019-08-03 MED ORDER — FUROSEMIDE 10 MG/ML IJ SOLN
40.0000 mg | Freq: Once | INTRAMUSCULAR | Status: AC
  Administered 2019-08-03: 40 mg via INTRAVENOUS
  Filled 2019-08-03: qty 4

## 2019-08-03 MED ORDER — ENSURE ENLIVE PO LIQD
237.0000 mL | Freq: Two times a day (BID) | ORAL | Status: DC
  Administered 2019-08-03 – 2019-08-07 (×5): 237 mL via ORAL

## 2019-08-03 NOTE — Progress Notes (Signed)
PT Cancellation Note  Patient Details Name: Kimbery Harwood MRN: [id number] DOB: 02-24-1930   Cancelled Treatment:     PT attempt. Per chart review, pt's Hgb 6.6. Per PT protocol will hold PT at this time. Will continue to follow per POC and progress as able when pt is more appropriate to participate.    Rushie Chestnut 08/03/2019, 10:27 AM

## 2019-08-03 NOTE — Progress Notes (Signed)
CH responded to call of 'Yoo-hoo!  Anyone out there??' from Pt.'s rm.  Pt. in recliner reading magazine inquiring after additional saltine crackers.  As visit unfolded, CH began to suspect pt. has some kind of memory issues as conversation began to become cyclical and repetitive, but pt. very pleasant and conversational.  When pt. saw [facility] badge she invited CH to sit and began sharing that she and her husband are [employer] Block and attend a large meeting in [place] where they live; husband is former Radio producer, pt. shared; her son attended [employer] for agriculture and now helps run the family farm.  Pt. has several grandchildren and is a former Pension scheme manager.  Pt. said her father was in the [employer] and so she grew up partially in [place] and was fluent in [place] and [place] at one time.   CH eventually excused himself, but may attempt a follow-up visit later this weekend if possible.    08/03/19 1210  Clinical Encounter Type  Visited With Patient  Visit Type Initial;Social support;Spiritual support  Referral From Patient  Stress Factors  Patient Stress Factors Health changes

## 2019-08-03 NOTE — Progress Notes (Signed)
Patient Name: Kimberly Wang Date of Encounter: 08/03/2019  Hospital Problem List     Active Problems:   Coagulation defect ([place])   Sepsis due to gram-negative UTI ([place])   Severe sepsis (HCC)   Dementia due to Parkinson's disease without behavioral disturbance ([place])   Benign essential HTN   CAD (coronary artery disease)   Anemia   Sepsis due to urinary tract infection ([place])   A-fib [facility])    Patient Profile     86 year old female with history of HFpEF followed at [facility], history of coronary artery disease status post PCI approximately 15 years ago, history atrial fibrillation rate controlled and anticoagulated with Eliquis. Rate control with carvedilol and diltiazem admitted with UTI sepsis. Shereceived a lot of fluid initially and developed volume overload. Has improved with careful diuresis. HGB has continued to drop. Being transfused.   Subjective     Inpatient Medications     atorvastatin  80 mg Oral Daily   diltiazem  240 mg Oral Daily   donepezil  10 mg Oral QHS   feeding supplement  1 Container Oral TID BM   furosemide  40 mg Intravenous Daily   megestrol  400 mg Oral Daily   melatonin  5 mg Oral Once   memantine  10 mg Oral Daily   multivitamin with minerals  1 tablet Oral Daily   pantoprazole (PROTONIX) IV  40 mg Intravenous Q12H   sertraline  50 mg Oral QHS    Vital Signs    Vitals:   08/03/19 0623 08/03/19 0752 08/03/19 1002 08/03/19 1035  BP: 114/64 118/83 (!) 114/53 (!) 127/93  Pulse: 81 73 (!) 52 71  Resp: 16  20   Temp: (!) 97.5 F (36.4 C) 98.1 F (36.7 C) 97.9 F (36.6 C) 98.2 F (36.8 C)  TempSrc: Oral Oral Tympanic Oral  SpO2: 99% 100% 98% 98%  Weight:      Height:        Intake/Output Summary (Last 24 hours) at 08/03/2019 1058 Last data filed at 08/02/2019 1815 Gross per 24 hour  Intake 360 ml  Output --  Net 360 ml   Filed Weights   07/31/19 0522 07/31/19 1001 08/01/19 0653  Weight: 77.2 kg 77.2  kg 77.3 kg    Physical Exam      Labs    CBC Recent Labs    08/02/19 0549 08/03/19 0637  WBC 14.4* 17.7*  NEUTROABS 11.7* 14.9*  HGB 7.0* 6.6*  HCT 22.6* 20.7*  MCV 97.0 94.5  PLT 313 295   Basic Metabolic Panel Recent Labs    08/01/19 0643 08/01/19 0643 08/02/19 0549 08/03/19 0637  NA 138   < > 138 134*  K 4.8   < > 4.6 5.0  CL 107   < > 108 106  CO2 24   < > 22 21*  GLUCOSE 103*   < > 90 103*  BUN 16   < > 21 17  CREATININE 0.87   < > 0.84 1.00  CALCIUM 8.4*   < > 8.0* 7.8*  MG 2.4   < > 2.6* 2.9*  PHOS 3.6  --   --   --    < > = values in this interval not displayed.   Liver Function Tests No results for input(s): AST, ALT, ALKPHOS, BILITOT, PROT, ALBUMIN in the last 72 hours. No results for input(s): LIPASE, AMYLASE in the last 72 hours. Cardiac Enzymes No results for input(s): CKTOTAL, CKMB, CKMBINDEX,  TROPONINI in the last 72 hours. BNP Recent Labs    08/01/19 0643  BNP 310.2*   D-Dimer No results for input(s): DDIMER in the last 72 hours. Hemoglobin A1C No results for input(s): HGBA1C in the last 72 hours. Fasting Lipid Panel No results for input(s): CHOL, HDL, LDLCALC, TRIG, CHOLHDL, LDLDIRECT in the last 72 hours. Thyroid Function Tests No results for input(s): TSH, T4TOTAL, T3FREE, THYROIDAB in the last 72 hours.  Invalid input(s): FREET3  Telemetry    afib with controlled and occasionally slow vr.   ECG    afib  Radiology    CT ABDOMEN PELVIS WO CONTRAST  Result Date: 07/22/2019 CLINICAL DATA:  Sepsis EXAM: CT ABDOMEN AND PELVIS WITHOUT CONTRAST TECHNIQUE: Multidetector CT imaging of the abdomen and pelvis was performed following the standard protocol without IV contrast. COMPARISON:  None. FINDINGS: Lower chest: There is mild cardiomegaly. Mitral valve calcifications. A moderate paraesophageal hernia containing contrast is seen. Hepatobiliary: Although limited due to the lack of intravenous contrast, normal in appearance without  gross focal abnormality. The patient is status post cholecystectomy. No biliary ductal dilation. Pancreas:  Unremarkable.  No surrounding inflammatory changes. Spleen: Normal in size. Although limited due to the lack of intravenous contrast, normal in appearance. Adrenals/Urinary Tract: Both adrenal glands appear normal. The kidneys and collecting system appear normal without evidence of urinary tract calculus or hydronephrosis. Bladder is unremarkable. Stomach/Bowel: The stomach, small bowel, are normal in appearance. There is scattered colonic diverticula without diverticulitis. Vascular/Lymphatic: There are no enlarged abdominal or pelvic lymph nodes. Scattered aortic atherosclerotic calcifications are seen without aneurysmal dilatation. Reproductive: The uterus and adnexa are unremarkable. Other: A small fat and transverse colon containing umbilical hernia seen within the mid abdomen. Musculoskeletal: No acute or significant osseous findings. IMPRESSION: Moderate paraesophageal hernia. Diverticulosis without diverticulitis. No other acute intra-abdominal or pelvic pathology to explain the patient's symptoms. Aortic Atherosclerosis (ICD10-I70.0). Electronically Signed   By: Jonna Clark M.D.   On: 07/22/2019 01:40   DG Chest 1 View  Result Date: 07/27/2019 CLINICAL DATA:  Shortness of breath EXAM: CHEST  1 VIEW COMPARISON:  July 26, 2019 FINDINGS: There is unchanged mild cardiomegaly. There is aortic knob and descending intrathoracic aortic calcifications. There is slight interval improvement in the prominence of the central pulmonary vasculature. No acute osseous abnormality. IMPRESSION: Slight interval improvement in the pulmonary vascular congestion. Electronically Signed   By: Jonna Clark M.D.   On: 07/27/2019 06:04   DG Chest 1 View  Result Date: 07/26/2019 CLINICAL DATA:  Shortness of breath. EXAM: CHEST  1 VIEW COMPARISON:  07/23/2019. FINDINGS: Mediastinum hilar structures normal. Cardiomegaly. Mild  bilateral interstitial prominence and small pleural effusions. CHF could present in this fashion. Sliding hiatal hernia cannot be excluded. IMPRESSION: Cardiomegaly. Mild bilateral interstitial prominence and small pleural effusions. CHF could present in this fashion. Electronically Signed   By: Maisie Fus  Register   On: 07/26/2019 07:52   DG Chest 1 View  Result Date: 07/23/2019 CLINICAL DATA:  Dyspnea EXAM: CHEST  1 VIEW COMPARISON:  Chest radiograph from one day prior. FINDINGS: Stable cardiomediastinal silhouette with mild cardiomegaly. No pneumothorax. Stable small left pleural effusion. No right pleural effusion. No overt pulmonary edema. Streaky bibasilar lung opacities are similar IMPRESSION: Stable small left pleural effusion. Stable streaky bibasilar lung opacities, favor atelectasis or scarring. Stable mild cardiomegaly without overt pulmonary edema. Electronically Signed   By: Delbert Phenix M.D.   On: 07/23/2019 04:50   DG Chest 1 View  Result  Date: 07/21/2019 CLINICAL DATA:  Sepsis. EXAM: CHEST  1 VIEW COMPARISON:  None. FINDINGS: There are small bilateral pleural effusions. There is a left perihilar density of unknown clinical significance. The heart size is mildly enlarged. There are dense aortic calcifications. There are few streaky airspace opacities at the lung bases favored to represent areas of atelectasis. There is no pneumothorax. IMPRESSION: 1. Small bilateral pleural effusions. 2. Mild cardiomegaly. 3. Slightly prominent left perihilar region may be secondary to a dilated pulmonary artery or lymphadenopathy. A follow-up two-view chest x-ray is recommended in 4-6 weeks. Electronically Signed   By: Katherine Mantlehristopher  Green M.D.   On: 07/21/2019 22:34   DG Chest Port 1 View  Result Date: 07/22/2019 CLINICAL DATA:  Shortness of breath. EXAM: PORTABLE CHEST 1 VIEW COMPARISON:  Jul 21, 2019 FINDINGS: Cardiomegaly. Skin fold over the lateral right chest. The hila and mediastinum are normal. No  pneumothorax. No nodules or masses. No focal infiltrates. IMPRESSION: No active disease. Electronically Signed   By: Gerome Samavid  Williams III M.D   On: 07/22/2019 15:12   ECHOCARDIOGRAM COMPLETE  Result Date: 07/27/2019    ECHOCARDIOGRAM REPORT   Patient Name:   Edd ArbourDIANE Collison Date of Exam: 07/26/2019 Medical Rec #:  [id number]   Height:       64.0 in Accession #:    [id number]  Weight:       164.2 lb Date of Birth:  01/13/1931    BSA:          1.799 m Patient Age:    88 years    BP:           117/65 mmHg Patient Gender: F           HR:           124 bpm. Exam Location:  [facility] Procedure: 2D Echo, Cardiac Doppler and Color Doppler Indications:     Dyspnea 786.09 / R06.00  History:         Patient has no prior history of Echocardiogram examinations.                  CAD; Risk Factors:Hypertension. MI.  Sonographer:     Neysa Bonitohristy Roar Referring Phys:  [id number] Kateri McMAIR LATIF [facility] Diagnosing Phys: Lorine BearsMuhammad Arida MD IMPRESSIONS  1. Left ventricular ejection fraction, by estimation, is 40 to 45%. The left ventricle has mildly decreased function. The left ventricle demonstrates regional wall motion abnormalities with possible anterior/anteroseptal hypokinesis. There is mild left ventricular hypertrophy. Left ventricular diastolic parameters are indeterminate.  2. Right ventricular systolic function is normal. The right ventricular size is normal. There is mildly elevated pulmonary artery systolic pressure.  3. Left atrial size was severely dilated.  4. Right atrial size was moderately dilated.  5. The mitral valve is abnormal. Moderate mitral valve regurgitation. No evidence of mitral stenosis.  6. The aortic valve is abnormal. Aortic valve regurgitation is moderate. Mild aortic valve sclerosis is present, with no evidence of aortic valve stenosis. FINDINGS  Left Ventricle: Left ventricular ejection fraction, by estimation, is 40 to 45%. The left ventricle has mildly decreased function. The left ventricle demonstrates regional wall  motion abnormalities. The left ventricular internal cavity size was normal in size. There is mild left ventricular hypertrophy. Left ventricular diastolic parameters are indeterminate. Right Ventricle: The right ventricular size is normal. No increase in right ventricular wall thickness. Right ventricular systolic function is normal. There is mildly elevated pulmonary artery systolic pressure. The tricuspid regurgitant velocity is 2.69  m/s,  and with an assumed right atrial pressure of 10 mmHg, the estimated right ventricular systolic pressure is 38.9 mmHg. Left Atrium: Left atrial size was severely dilated. Right Atrium: Right atrial size was moderately dilated. Pericardium: There is no evidence of pericardial effusion. Mitral Valve: The mitral valve is abnormal. There is moderate thickening of the mitral valve leaflet(s). There is mild calcification of the mitral valve leaflet(s). Normal mobility of the mitral valve leaflets. Moderate mitral annular calcification. Moderate mitral valve regurgitation. No evidence of mitral valve stenosis. Tricuspid Valve: The tricuspid valve is normal in structure. Tricuspid valve regurgitation is not demonstrated. No evidence of tricuspid stenosis. Aortic Valve: The aortic valve is abnormal. Aortic valve regurgitation is moderate. Mild aortic valve sclerosis is present, with no evidence of aortic valve stenosis. Aortic valve mean gradient measures 7.5 mmHg. Aortic valve peak gradient measures 14.1 mmHg. Aortic valve area, by VTI measures 1.73 cm. Pulmonic Valve: The pulmonic valve was normal in structure. Pulmonic valve regurgitation is not visualized. No evidence of pulmonic stenosis. Aorta: The aortic root is normal in size and structure. Venous: The inferior vena cava was not well visualized. IAS/Shunts: No atrial level shunt detected by color flow Doppler.  LEFT VENTRICLE PLAX 2D LVIDd:         4.47 cm  Diastology LVIDs:         3.12 cm  LV e' lateral:   6.64 cm/s LV PW:          1.14 cm  LV E/e' lateral: 23.8 LV IVS:        1.06 cm  LV e' medial:    8.81 cm/s LVOT diam:     1.70 cm  LV E/e' medial:  17.9 LV SV:         52 LV SV Index:   29 LVOT Area:     2.27 cm  RIGHT VENTRICLE RV Mid diam:    2.29 cm RV S prime:     12.70 cm/s TAPSE (M-mode): 1.6 cm LEFT ATRIUM             Index       RIGHT ATRIUM           Index LA diam:        4.20 cm 2.33 cm/m  RA Area:     19.00 cm LA Vol (A2C):   92.9 ml 51.64 ml/m RA Volume:   44.60 ml  24.79 ml/m LA Vol (A4C):   82.8 ml 46.02 ml/m LA Biplane Vol: 91.4 ml 50.80 ml/m  AORTIC VALVE                    PULMONIC VALVE AV Area (Vmax):    1.54 cm     PV Vmax:        1.10 m/s AV Area (Vmean):   1.56 cm     PV Peak grad:   4.8 mmHg AV Area (VTI):     1.73 cm     RVOT Peak grad: 2 mmHg AV Vmax:           187.50 cm/s AV Vmean:          123.000 cm/s AV VTI:            0.301 m AV Peak Grad:      14.1 mmHg AV Mean Grad:      7.5 mmHg LVOT Vmax:         127.00 cm/s LVOT Vmean:        84.500  cm/s LVOT VTI:          0.229 m LVOT/AV VTI ratio: 0.76  AORTA Ao Root diam: 2.20 cm MITRAL VALVE                TRICUSPID VALVE MV Area (PHT): 3.16 cm     TR Peak grad:   28.9 mmHg MV Decel Time: 240 msec     TR Vmax:        269.00 cm/s MV E velocity: 158.00 cm/s                             SHUNTS                             Systemic VTI:  0.23 m                             Systemic Diam: 1.70 cm Lorine Bears MD Electronically signed by Lorine Bears MD Signature Date/Time: 07/27/2019/8:13:34 AM    Final     Assessment & Plan    86 y.o.femalewith history ofheart failure with preserved ejection fraction, chronic atrial fibrillation, coronary artery disease, dementia who is followed by [facility] who was admitted with weakness and acute onset of confusion worse over her baseline.She had a cardiac MRI in 2011 showing normal RV and LV function. There was a left atrial thrombus with no evidence of myocardial infarction.  She had a non-STEMI in 2006 with cardiac cath revealing distal LAD disease not amenable to PCI. She had a PCI of the RCA with a Taxus stent. This was done in 2006 earlier. She has been on rate control with diltiazem for her A. fib. She has been on Eliquis for anticoagulation. She was diagnosed with both mixed Alzheimer's and vascular dementia in 2015. Her most recent outpatient regimen included apixaban 5 mg twice daily, atorvastatin 80 mg daily, carvedilol 6.25 mg twice daily, diltiazem 240 mg daily, furosemide 80 mg twice daily, spironolactone 25 mg daily along with potassium chloride to 20 mEq daily.   1. HFpEF-EF has been well-preserved by echo and cardiac MRI at [facility] with normal RV and LV function. Chest x-ray did not show pulmonary edema on admission. Fluid status appears to be over hydration. She does not appear acutely in congestive heart failure at present. Heart rate is controlled and she is oxygenating well. Would continue to carefully diurese following renal function and symptoms. BNP was mildly elevated.Will repeat today. Echo done during this admission was read as showing an EF of 40 to 45% with moderate MR and AI.  Evidence of volume overload while getting transfused. Will increase lasix to 40 iv daily.    2. Atrial fibrillation-rate appears well controlled at present. We will continue with diltiazem . Continue to hold anticolagulation due to anemia and possible active bleeding.   3. Sepsis-continue withcurrent rx.  4. Dementia-has mixed vascular and Alzheimer's dementia. Per chart, appears to be getting near her baseline.  5. Coronary artery disease-status post PCI. This was done in 2006. No clinical evidence of ischemia. Patient denies chest pain at present. Not a candidate for invasive evaluation at present. Cardiac markers are unremarkable.  6.  Anemia-HGB down further. GI workup in progress.     Signed, Darlin Priestly Clary Boulais MD 08/03/2019, 10:58 AM   Pager: [phone]

## 2019-08-03 NOTE — Progress Notes (Signed)
Arlyss Repress, MD [address]  [address]  [place], [place] [address]  Main: [phone]  Fax: [phone] Pager: [phone]   Subjective: Patient did not prep yesterday. This morning, patient was found to have actively wheezing, with volume overload and hemoglobin decreased to 6.6 with worsening leukocytosis. Per patient's nurse, patient has been having green bowel movements only. There was no evidence of any rectal bleeding. Colonoscopy has been canceled today due to clinical deterioration. Cardiology is on board   Objective: Vital signs in last 24 hours: Vitals:   08/03/19 1100 08/03/19 1122 08/03/19 1315 08/03/19 1330  BP:  130/77  (!) 113/40  Pulse:  84  67  Resp:   16 (!) 21  Temp:  97.6 F (36.4 C)  98.3 F (36.8 C)  TempSrc:  Oral  Oral  SpO2:  100%  98%  Weight: 74.3 kg     Height: 5\' 4"  (1.626 m)      Weight change:   Intake/Output Summary (Last 24 hours) at 08/03/2019 1544 Last data filed at 08/03/2019 1350 Gross per 24 hour  Intake 1270 ml  Output --  Net 1270 ml     Exam: Heart:: Regular rate and rhythm, S1S2 present or without murmur or extra heart sounds Lungs: expiratory wheezes and rhonchi throughout both lung fields Abdomen: soft, nontender, normal bowel sounds   Lab Results: CBC Latest Ref Rng & Units 08/03/2019 08/02/2019 08/01/2019  WBC 4.0 - 10.5 K/uL 17.7(H) 14.4(H) 14.3(H)  Hemoglobin 12.0 - 15.0 g/dL 6.6(L) 7.0(L) 7.2(L)  Hematocrit 36 - 46 % 20.7(L) 22.6(L) 23.0(L)  Platelets 150 - 400 K/uL 390 313 345   CMP Latest Ref Rng & Units 08/03/2019 08/02/2019 08/01/2019  Glucose 70 - 99 mg/dL 10/01/2019) 90 237(S)  BUN 8 - 23 mg/dL 17 21 16   Creatinine 0.44 - 1.00 mg/dL 283(T 5.17  Sodium 135 - 145 mmol/L 134(L) 138 138  Potassium 3.5 - 5.1 mmol/L 5.0 4.6 4.8  Chloride 98 - 111 mmol/L 106 108 107  CO2 22 - 32 mmol/L 21(L) 22 24  Calcium 8.9 - 10.3 mg/dL 7.8(L) 8.0(L) 8.4(L)  Total Protein 6.5 - 8.1 g/dL - - -  Total Bilirubin 0.3 -  1.2 mg/dL - - -  Alkaline Phos 38 - 126 U/L - - -  AST 15 - 41 U/L - - -  ALT 0 - 44 U/L - - -    Micro Results: No results found for this or any previous visit (from the past 240 hour(s)). Studies/Results: No results found. Medications:  I have reviewed the patient's current medications. Prior to Admission:  Medications Prior to Admission  Medication Sig Dispense Refill Last Dose  . atorvastatin (LIPITOR) 80 MG tablet Take 80 mg by mouth daily.    Unknown at Unknown  . carvedilol (COREG) 6.25 MG tablet Take 6.25 mg by mouth 2 (two) times daily.   Unknown at Unknown  . diltiazem (CARDIZEM CD) 240 MG 24 hr capsule Take 240 mg by mouth daily.    Unknown at Unknown  . donepezil (ARICEPT) 10 MG tablet Take 10 mg by mouth daily.    Unknown at Unknown  . ELIQUIS 5 MG TABS tablet Take 5 mg by mouth 2 (two) times daily.    Unknown at Unknown  . FEROSUL 325 (65 Fe) MG tablet Take 325 mg by mouth daily with breakfast.    Unknown at Unknown  . furosemide (LASIX) 80 MG tablet Take 80 mg by mouth 2 (  two) times daily.    Unknown at Unknown  . memantine (NAMENDA) 10 MG tablet Take 10 mg by mouth 2 (two) times daily.    Unknown at Unknown  . Multiple Vitamin (MULTIVITAMIN) capsule Take by mouth.   Unknown at Unknown  . nitroGLYCERIN (NITROSTAT) 0.4 MG SL tablet Place 0.4 mg under the tongue every 5 (five) minutes as needed for chest pain.    Unknown at PRN  . potassium chloride (KLOR-CON) 10 MEQ tablet Take 10 mEq by mouth daily.    Unknown at Unknown  . sertraline (ZOLOFT) 50 MG tablet Take 50 mg by mouth in the morning and at bedtime.    Unknown at Unknown  . spironolactone (ALDACTONE) 25 MG tablet Take 25 mg by mouth daily.    Unknown at Unknown   Scheduled: . atorvastatin  80 mg Oral Daily  . diltiazem  240 mg Oral Daily  . donepezil  10 mg Oral QHS  . feeding supplement (ENSURE ENLIVE)  237 mL Oral BID BM  . furosemide  40 mg Intravenous Daily  . megestrol  400 mg Oral Daily  . melatonin  5  mg Oral Once  . memantine  10 mg Oral Daily  . multivitamin with minerals  1 tablet Oral Daily  . pantoprazole (PROTONIX) IV  40 mg Intravenous Q12H  . sertraline  50 mg Oral QHS   Continuous:  MWN:[id number] **OR** acetaminophen, ipratropium-albuterol, magnesium hydroxide, nitroGLYCERIN, ondansetron **OR** ondansetron (ZOFRAN) IV, QUEtiapine, traZODone Anti-infectives (From admission, onward)   Start     Dose/Rate Route Frequency Ordered Stop   07/26/19 0600  cephALEXin (KEFLEX) capsule 500 mg        500 mg Oral Every 6 hours 07/25/19 1718 07/29/19 0559   07/22/19 0900  cefTRIAXone (ROCEPHIN) 1 g in sodium chloride 0.9 % 100 mL IVPB  Status:  Discontinued        1 g 200 mL/hr over 30 Minutes Intravenous Every 24 hours 07/22/19 0248 07/25/19 1714   07/22/19 0145  vancomycin (VANCOCIN) IVPB 1000 mg/200 mL premix        1,000 mg 200 mL/hr over 60 Minutes Intravenous  Once 07/22/19 0144 07/22/19 0349   07/22/19 0045  ceFEPIme (MAXIPIME) 1 g in sodium chloride 0.9 % 100 mL IVPB        1 g 200 mL/hr over 30 Minutes Intravenous  Once 07/22/19 0039 07/22/19 0202     Scheduled Meds: . atorvastatin  80 mg Oral Daily  . diltiazem  240 mg Oral Daily  . donepezil  10 mg Oral QHS  . feeding supplement (ENSURE ENLIVE)  237 mL Oral BID BM  . furosemide  40 mg Intravenous Daily  . megestrol  400 mg Oral Daily  . melatonin  5 mg Oral Once  . memantine  10 mg Oral Daily  . multivitamin with minerals  1 tablet Oral Daily  . pantoprazole (PROTONIX) IV  40 mg Intravenous Q12H  . sertraline  50 mg Oral QHS   Continuous Infusions: PRN Meds:.acetaminophen **OR** acetaminophen, ipratropium-albuterol, magnesium hydroxide, nitroGLYCERIN, ondansetron **OR** ondansetron (ZOFRAN) IV, QUEtiapine, traZODone   Assessment: Active Problems:   Coagulation defect (HCC)   Sepsis due to gram-negative UTI (HCC)   Severe sepsis (HCC)   Dementia due to Parkinson's disease without behavioral disturbance  (HCC)   Benign essential HTN   CAD (coronary artery disease)   Anemia   Sepsis due to urinary tract infection (HCC)   A-fib (HCC)  Progressive anemia with no evidence of  active GI bleed Patient has iron deficiency per oncology  Plan: Attempted to do colonoscopy this week after bowel prep.  Patient is not fully prepped.  Also, patient is currently hemodynamically unstable from cardiac and respiratory standpoint with volume overload I am not convinced if her progressive anemia is secondary to GI bleed as there is no evidence of visible bleeding for the rate of drop in hemoglobin over last few days during this admission.  Moreover, patient has been having green bowel movements only Patient has worsening leukocytosis, etiology unclear Cardiology is on board for CHF exacerbation Patient is receiving blood transfusion as needed Discussed with patient's daughter-in-law in detail about patient's clinical condition and failure to do colonoscopy due to her current clinical status Can attempt flexible sigmoidoscopy/colonoscopy closer to discharge are as outpatient  GI will sign off at this time, please call us back with questions/concerns   LOS: 12 days   Pierce Biagini 08/03/2019, 3:44 PM

## 2019-08-03 NOTE — Progress Notes (Signed)
PROGRESS NOTE    Camie Hauss  XTK:[id number] DOB: June 13, 1930 DOA: 07/22/2019 PCP: [name], MD   Brief Narrative:  HPI On 07/22/2019 by Dr. Valente David [name] a88 y.o.femalewith a known history of coronary artery disease, hypertension and dementia, who presented to the emergency room with acute onset of altered mental status with generalized weakness and confusion and significant diminish p.o. intake especially today. She vomited once. She was having low-grade fever without chills. She denied any abdominal pain. No significant dysuria, hematuria or urgency or frequency or flank pain. No cough or wheezing or dyspnea.  Upon presentation to the emergency room, temperature was 100.2 and heart rate 101 with otherwise normal vital signs. CMP revealed hyponatremia and hypokalemia as well as hypochloremia with blood glucose of 223, BUN of 18 and creatinine 1.14, albumin of 3.4. Lactic acid was 3.8 and later 3.7. High-sensitivity troponin I was 60. CBC showed leukocytosis of 25.1 with neutrophilia. Urinalysis showed 6-10 WBCs with positive nitrite and trace leukocytes. Blood cultures were drawn and urine culture was sent. Abdominal and pelvic CT scan revealed paraesophageal hernia with no acute findings. Chest x-ray showed bilateral small pleural effusions with mild cardiomegaly.  The patient was given 1 g of p.o. Tylenol, a gram of IV cefepime and a gram of IV vancomycin, 1 L of lactated Ringer, 10 medical IV potassium chloride and 40 mEq p.o. as well as 1 L IV normal saline. She will be admitted to the progressive unit bed for further evaluation and management.  Interim history  Remains pleasantly demented but will need PT OT to further evaluate and treat and now they are recommending SNF. Antibiotics have been changed to p.o.  She was wheezing a little bit her IV fluids have been stopped and she is given a dose of IV Lasix the day before yesterday and again yesterday and  will be given another one today. Cardiology was consulted for concern of CHF and Volume overload. She remains in Atrial Fibrillation but has had some faster heart rates.  Because of concern for GI bleed as her hemoglobin has dropped significantly along with an elevated BUN gastroenterology was consulted and they feel that the patient needs further hematological work-up to determine etiology of her anemia before proceeding with any endoscopic procedure.  Patient is unsure about having endoscopic procedure.  Fecal occult test has been pending.  Her Eliquis has been held for now.  Will need to continue monitor for signs and symptoms of bleeding.  Cardiology she still is not in overt heart failure recommending continuing to carefully diurese and following renal function and symptoms given that she is still 6.5 L positive.  Cardiology recommends discharging from their standpoint on current medications with Lipitor, diltiazem and K-Dur 20 milliequivalents and following up with Jonelle Sidle at [facility]  Hematology was consulted given her acute anemia and Dr. Cathie Hoops assisting and he recommends checking an MMA which is currently pending.  He is also recommended checking a haptoglobin which is also pending but he feels that hemolysis is less likely.  Because her hemoglobin continues to decrease further he recommends transfusion if it is below 7 and increasing will iron supplementation to twice daily.  He feels that we will need to rule out GI bleeding and cardiology recommending resuming Eliquis 5 mg twice daily as they do not feel that she is bleeding, however with her continuing drop in hemoglobin I do not feel that this is secondary to hydration and will hold until we figure out the  etiology of her blood count being lower   Assessment & Plan   Severe sepsis secondary to E. coli urinary tract infection -Present on admission.  Patient with leukocytosis, tachycardia, tachypnea, elevated lactic acid AMS. -Patient is completed  antibiotic course -She was on IV saline which has been discontinued given that she had dyspnea and wheezing.  She was then given 1 dose of IV Lasix. -Leukocytosis has remained on has been somewhat fluctuating.  Previous hospitalist discussed with Dr. Tasia Catchings and pathologist, Dr. Liliane Bade, who reviewed patient's smear and was noted to have no blasts or fragmented to PVCs.  Likely reactive leukoerythroblastic changes blood loss versus hemolysis.  Dr. Tasia Catchings states this is likely secondary to UTI -Leukocytosis down to 14.4 today., she need to follow up with hematology  Acute metabolic encephalopathy superimposed on chronic dementia -Patient presented with altered mental status.  Suspect this is secondary to the above -Continue Aricept, memantine, Seroquel -Minimize sedating medications -She is pleasantly confused and cooperative this morning  Atrial fibrillation with RVR -Cardiology consulted and appreciated, recommended continuing diltiazem -Eliquis has been held due to concern of GI bleed as patient's hemoglobin continues to drop -Heart rate stable  Acute on chronic diastolic heart failure -In the setting of mild volume overload given the patient presented with sepsis and was given IV fluids --Patient was given IV Lasix then transitioned to oral Lasix -she has wheezing and leg edema today, no documented hypoxia at rest,  - cards change oral Lasix back to IV Lasix -Continue to monitor intake and output, daily weights  Hyponatremia -Suspect multifactorial including volume depletion, SSRI, anorexia -Patient was on IV fluids however this was discontinued -Zoloft has been held -Continue Megace -Resolved -Continue to monitor BMP  Elevated troponin/demand ischemia -With history of CAD status post PCI in 2006 -Relatively flat, peaked to 64.  Not consistent with ACS.  Suspect demand ischemia. -Continue statin -Eliquis  Held, is not compliant with colonoscopy prep, colonoscopy is canceled plan to have it  done outpatient, resume Eliquis today, monitor sign of bleeding and H&H  Normocytic anemia/possible GI bleed -Was on Eliquis prior to admission as well as oral iron which has been held -Anemia panel showed iron 30, ferritin 55, folate 14.8, vitamin B12 440 -Previous hospitalist discussed with hematology, Dr.Yu.  Not feel that the iron panel is typical for iron deficiency.  Recommended pending reticulocyte panel and obtain LDH and haptoglobin.  LDH was normal.  Increased reticulocytosis can be seen acute GI bleed or hemolysis.  T bilirubin is normal, less likely hemolysis. -Gastroenterology consulted and appreciated -Hemoglobin this morning down to 7.2 -Status post EGD: Large radial hernia, normal duodenal bulb and second portion of duodenum.  Normal stomach biopsied.  Normal gastroesophageal junction and esophagus.   -Eliquis  Held, is not compliant with colonoscopy prep, -Family insist to have colonoscopy done in the hospital, Eliquis held, inpatient colonoscopy per GI  Essential hypertension -Continue Cardizem, lasix -she is on coreg, spironolactone at home , she is not currently on this, will discuss with cardiology about restarting these meds  Dyslipidemia -LDL 38, continue statin  Elevated BUN -Improved, normalized  Hypomagnesemia/hypokalemia/hypophosphatemia -Resolved with repletion, continue to monitor and replace as needed  Deconditioning/generalized weakness -PT and OT recommending home health  DVT Prophylaxis  SCDs  Code Status: Full code  Family Communication: daughter AMY at  bedside  Disposition Plan:  Status is: Inpatient  Remains inpatient appropriate because:Inpatient level of care appropriate due to severity of illness.  Continues to have anemia,  hemoglobin to 7.2 today. Restarting eliquis, needs to monitor hgb signs of bleeding Needs to adjust meds as she has wheezing and edema.   Dispo: The patient is from: Home              Anticipated d/c is to: SNF vs  Home with [facility]              Anticipated d/c date is:1- 2 days needs cards and GI clearance, need colonoscopy              Patient currently is not medically stable to d/c.  Consultants Cardiology Gastroenterology Hematology  Procedures  EGD PRBC transfusionx1 on 6/11  Antibiotics   Anti-infectives (From admission, onward)   Start     Dose/Rate Route Frequency Ordered Stop   07/26/19 0600  cephALEXin (KEFLEX) capsule 500 mg        500 mg Oral Every 6 hours 07/25/19 1718 07/29/19 0559   07/22/19 0900  cefTRIAXone (ROCEPHIN) 1 g in sodium chloride 0.9 % 100 mL IVPB  Status:  Discontinued        1 g 200 mL/hr over 30 Minutes Intravenous Every 24 hours 07/22/19 0248 07/25/19 1714   07/22/19 0145  vancomycin (VANCOCIN) IVPB 1000 mg/200 mL premix        1,000 mg 200 mL/hr over 60 Minutes Intravenous  Once 07/22/19 0144 07/22/19 0349   07/22/19 0045  ceFEPIme (MAXIPIME) 1 g in sodium chloride 0.9 % 100 mL IVPB        1 g 200 mL/hr over 30 Minutes Intravenous  Once 07/22/19 0039 07/22/19 0202      Subjective:   Findley Leabo seen and examined today.  Patient with no complaints , she is sitting up in chair, she is pleasantly confused She wants to eat Daughter Amy at bedside Objective:   Vitals:   08/03/19 1002 08/03/19 1035 08/03/19 1100 08/03/19 1122  BP: (!) 114/53 (!) 127/93  130/77  Pulse: (!) 52 71  84  Resp: 20 19    Temp: 97.9 F (36.6 C) 98.2 F (36.8 C)  97.6 F (36.4 C)  TempSrc: Tympanic Oral  Oral  SpO2: 98% 98%  100%  Weight:   74.3 kg   Height:   5\' 4"  (1.626 m)     Intake/Output Summary (Last 24 hours) at 08/03/2019 1249 Last data filed at 08/02/2019 1815 Gross per 24 hour  Intake 360 ml  Output --  Net 360 ml   Filed Weights   07/31/19 1001 08/01/19 0653 08/03/19 1100  Weight: 77.2 kg 77.3 kg 74.3 kg    Exam  General: pleasantly confused elderly, chronically ill-appearing, NAD  HEENT: NCAT,  mucous membranes moist.   Cardiovascular: S1 S2  auscultated, irregular  Respiratory: upper airway wheezing mostly, does has mild wheezing lower lung fields   Abdomen: Soft, nontender, nondistended, + bowel sounds  Extremities: warm dry without cyanosis clubbing. +++ lower extremity edema.  Neuro: AAOx2 (person, place), has short-term memory deficits.  Nonfocal  Skin: Without rashes exudates or nodules  Psych: Pleasantly confused   Data Reviewed: I have personally reviewed following labs and imaging studies  CBC: Recent Labs  Lab 07/30/19 0700 07/31/19 0505 08/01/19 0643 08/02/19 0549 08/03/19 0637  WBC 17.1* 12.5* 14.3* 14.4* 17.7*  NEUTROABS 13.2* 9.8* 11.7* 11.7* 14.9*  HGB 8.0* 7.2* 7.2* 7.0* 6.6*  HCT 25.1* 22.4* 23.0* 22.6* 20.7*  MCV 96.9 94.9 97.0 97.0 94.5  PLT 406* 329 345 313 390   Basic Metabolic  Panel: Recent Labs  Lab 07/28/19 0454 07/29/19 0731 07/29/19 0856 07/29/19 0856 07/30/19 0700 07/31/19 0505 08/01/19 0643 08/02/19 0549 08/03/19 0637  NA   < >  --  135  --  136  --  138 138 134*  K   < >  --  4.5  --  4.6  --  4.8 4.6 5.0  CL   < >  --  105  --  104  --  107 108 106  CO2   < >  --  22  --  23  --  24 22 21*  GLUCOSE   < >  --  105*  --  119*  --  103* 90 103*  BUN   < >  --  19  --  18  --  16 21 17   CREATININE   < >  --  0.73  --  0.80  --  0.87 0.84 1.00  CALCIUM   < >  --  8.6*  --  8.6*  --  8.4* 8.0* 7.8*  MG   < > 2.3 2.2   < > 2.3 2.4 2.4 2.6* 2.9*  PHOS  --  3.3 3.3  --  3.5 3.6 3.6  --   --    < > = values in this interval not displayed.   GFR: Estimated Creatinine Clearance: 39.1 mL/min (by C-G formula based on SCr of 1 mg/dL). Liver Function Tests: Recent Labs  Lab 07/28/19 0454 07/29/19 0856 07/30/19 0700  AST 36 31 33  ALT 42 41 41  ALKPHOS 69 69 72  BILITOT 0.5 0.8 0.6  PROT 5.2* 5.5* 5.6*  ALBUMIN 2.4* 2.7* 2.7*   No results for input(s): LIPASE, AMYLASE in the last 168 hours. No results for input(s): AMMONIA in the last 168 hours. Coagulation  Profile: Recent Labs  Lab 07/27/19 1531  INR 1.5*   Cardiac Enzymes: No results for input(s): CKTOTAL, CKMB, CKMBINDEX, TROPONINI in the last 168 hours. BNP (last 3 results) No results for input(s): PROBNP in the last 8760 hours. HbA1C: No results for input(s): HGBA1C in the last 72 hours. CBG: Recent Labs  Lab 07/31/19 1922  GLUCAP 135*   Lipid Profile: No results for input(s): CHOL, HDL, LDLCALC, TRIG, CHOLHDL, LDLDIRECT in the last 72 hours. Thyroid Function Tests: No results for input(s): TSH, T4TOTAL, FREET4, T3FREE, THYROIDAB in the last 72 hours. Anemia Panel: Recent Labs    08/01/19 1225  RETICCTPCT 7.4*   Urine analysis:    Component Value Date/Time   COLORURINE YELLOW (A) 07/22/2019 0054   APPEARANCEUR HAZY (A) 07/22/2019 0054   LABSPEC 1.012 07/22/2019 0054   PHURINE 5.0 07/22/2019 0054   GLUCOSEU NEGATIVE 07/22/2019 0054   HGBUR MODERATE (A) 07/22/2019 0054   BILIRUBINUR NEGATIVE 07/22/2019 0054   KETONESUR NEGATIVE 07/22/2019 0054   PROTEINUR NEGATIVE 07/22/2019 0054   NITRITE POSITIVE (A) 07/22/2019 0054   LEUKOCYTESUR TRACE (A) 07/22/2019 0054   Sepsis Labs: @LABRCNTIP (procalcitonin:4,lacticidven:4)  ) No results found for this or any previous visit (from the past 240 hour(s)).    Radiology Studies: No results found.   Scheduled Meds: . atorvastatin  80 mg Oral Daily  . diltiazem  240 mg Oral Daily  . donepezil  10 mg Oral QHS  . feeding supplement  1 Container Oral TID BM  . furosemide  40 mg Intravenous Daily  . megestrol  400 mg Oral Daily  . melatonin  5 mg Oral Once  . memantine  10 mg Oral Daily  . multivitamin with minerals  1 tablet Oral Daily  . pantoprazole (PROTONIX) IV  40 mg Intravenous Q12H  . sertraline  50 mg Oral QHS   Continuous Infusions:    LOS: 12 days   Time Spent in minutes   45 minutes  Albertine Grates MD PhD FACPon 08/03/2019 at 12:49 PM  Between 7am to 7pm - Please see pager noted on [email or web address]  After 7pm go  to [email or web address]  And look for the night coverage person covering for me after hours  [facility] Office  [phone]

## 2019-08-03 NOTE — Progress Notes (Signed)
Hematology/Oncology Progress Note [facility] [phone] [phone]  Patient Care Team: [name], MD as PCP - General (Family Medicine)   Name of the patient: Kimberly Wang  [id number]  1930-02-29  Date of visit: 08/03/19   INTERVAL HISTORY-  Patient was sitting in the chair.  She has no new complaints.  She feels hungry and wants to monitor her lunch.    Review of systems- Review of Systems  Unable to perform ROS: Dementia    Allergies  Allergen Reactions  . Latex Hives and Rash  . Lidocaine Hcl Other (See Comments)    Other Reaction: Other reaction. Patient can't take Novacaine  . Codeine Other (See Comments)  . Morphine Other (See Comments)  . Novocain [Procaine]     Heart races  . Other Other (See Comments)    Uncoded Allergy. Allergen: RED PEPPER    Patient Active Problem List   Diagnosis Date Noted  . A-fib (HCC) 08/01/2019  . Sepsis due to urinary tract infection (HCC)   . Anemia   . Sepsis due to gram-negative UTI (HCC) 07/22/2019  . Severe sepsis (HCC) 07/22/2019  . Dementia due to Parkinson's disease without behavioral disturbance (HCC) 07/22/2019  . Benign essential HTN 07/22/2019  . CAD (coronary artery disease) 07/22/2019  . Pain due to onychomycosis of toenails of both feet 05/17/2019  . Coagulation defect (HCC) 05/17/2019     Past Medical History:  Diagnosis Date  . Coronary artery disease   . Dementia (HCC)   . Hypertension   . Myocardial infarct [facility])      Past Surgical History:  Procedure Laterality Date  . CHOLECYSTECTOMY    . CORONARY STENT PLACEMENT    . ESOPHAGOGASTRODUODENOSCOPY (EGD) WITH PROPOFOL N/A 07/31/2019   Procedure: ESOPHAGOGASTRODUODENOSCOPY (EGD) WITH PROPOFOL;  Surgeon: Toney Reil, MD;  Location[facility] ENDOSCOPY;  Service: Gastroenterology;  Laterality: N/A;    Social History   Socioeconomic History  . Marital status: Married    Spouse name: Not on  file  . Number of children: Not on file  . Years of education: Not on file  . Highest education level: Not on file  Occupational History  . Not on file  Tobacco Use  . Smoking status: Never Smoker  . Smokeless tobacco: Never Used  Vaping Use  . Vaping Use: Never used  Substance and Sexual Activity  . Alcohol use: Never  . Drug use: Never  . Sexual activity: Not on file  Other Topics Concern  . Not on file  Social History Narrative  . Not on file   Social Determinants of Health   Financial Resource Strain:   . Difficulty of Paying Living Expenses:   Food Insecurity:   . Worried About Programme researcher, broadcasting/film/video in the Last Year:   . Barista in the Last Year:   Transportation Needs:   . Freight forwarder (Medical):   Marland Kitchen Lack of Transportation (Non-Medical):   Physical Activity:   . Days of Exercise per Week:   . Minutes of Exercise per Session:   Stress:   . Feeling of Stress :   Social Connections:   . Frequency of Communication with Friends and Family:   . Frequency of Social Gatherings with Friends and Family:   . Attends Religious Services:   . Active Member of Clubs or Organizations:   . Attends Banker Meetings:   Marland Kitchen Marital Status:   Intimate Partner Violence:   .  Fear of Current or Ex-Partner:   . Emotionally Abused:   Marland Kitchen Physically Abused:   . Sexually Abused:      History reviewed. No pertinent family history.   Current Facility-Administered Medications:  .  acetaminophen (TYLENOL) tablet 650 mg, 650 mg, Oral, Q6H PRN, 650 mg at 07/25/19 2125 **OR** acetaminophen (TYLENOL) suppository 650 mg, 650 mg, Rectal, Q6H PRN, Mansy, Jan A, MD .  atorvastatin (LIPITOR) tablet 80 mg, 80 mg, Oral, Daily, Mansy, Jan A, MD, 80 mg at 08/03/19 0916 .  diltiazem (CARDIZEM CD) 24 hr capsule 240 mg, 240 mg, Oral, Daily, Mansy, Jan A, MD, 240 mg at 08/03/19 0916 .  donepezil (ARICEPT) tablet 10 mg, 10 mg, Oral, QHS, Mansy, Jan A, MD, 10 mg at 08/02/19  2226 .  feeding supplement (ENSURE ENLIVE) (ENSURE ENLIVE) liquid 237 mL, 237 mL, Oral, BID BM, Albertine Grates, MD, 237 mL at 08/03/19 1331 .  furosemide (LASIX) injection 40 mg, 40 mg, Intravenous, Daily, Dalia Heading, MD, 40 mg at 08/03/19 1331 .  ipratropium-albuterol (DUONEB) 0.5-2.5 (3) MG/3ML nebulizer solution 3 mL, 3 mL, Nebulization, Q6H PRN, Jimmye Norman, NP, 3 mL at 08/03/19 1619 .  magnesium hydroxide (MILK OF MAGNESIA) suspension 30 mL, 30 mL, Oral, Daily PRN, Mansy, Jan A, MD .  megestrol (MEGACE) 400 MG/10ML suspension 400 mg, 400 mg, Oral, Daily, Drema Dallas, MD, 400 mg at 08/03/19 0918 .  melatonin tablet 5 mg, 5 mg, Oral, Once, Manuela Schwartz, NP .  memantine [facility]) tablet 10 mg, 10 mg, Oral, Daily, Mansy, Jan A, MD, 10 mg at 08/03/19 0277 .  multivitamin with minerals tablet 1 tablet, 1 tablet, Oral, Daily, Valrie Hart A, RPH, 1 tablet at 08/03/19 0914 .  nitroGLYCERIN (NITROSTAT) SL tablet 0.4 mg, 0.4 mg, Sublingual, Q5 min PRN, Mansy, Jan A, MD .  ondansetron (ZOFRAN) tablet 4 mg, 4 mg, Oral, Q6H PRN **OR** ondansetron (ZOFRAN) injection 4 mg, 4 mg, Intravenous, Q6H PRN, Mansy, Jan A, MD, 4 mg at 08/02/19 1948 .  pantoprazole (PROTONIX) injection 40 mg, 40 mg, Intravenous, Q12H, Marguerita Merles Chester, DO, 40 mg at 08/03/19 4128 .  QUEtiapine (SEROQUEL) tablet 25 mg, 25 mg, Oral, QHS PRN, Jimmye Norman, NP, 25 mg at 08/03/19 1602 .  sertraline (ZOLOFT) tablet 50 mg, 50 mg, Oral, QHS, Ronnald Ramp, RPH, 50 mg at 08/02/19 2226 .  traZODone (DESYREL) tablet 25 mg, 25 mg, Oral, QHS PRN, Mansy, Jan A, MD, 25 mg at 08/01/19 2117   Physical exam:  Vitals:   08/03/19 1122 08/03/19 1315 08/03/19 1330 08/03/19 1613  BP: 130/77  (!) 113/40 115/65  Pulse: 84  67 77  Resp:  16 (!) 21   Temp: 97.6 F (36.4 C)  98.3 F (36.8 C) 98.2 F (36.8 C)  TempSrc: Oral  Oral Oral  SpO2: 100%  98% 99%  Weight:      Height:       Physical Exam Constitutional:       General: She is not in acute distress.    Appearance: She is not diaphoretic.  HENT:     Head: Normocephalic and atraumatic.     Mouth/Throat:     Pharynx: No oropharyngeal exudate.  Eyes:     General: No scleral icterus.    Pupils: Pupils are equal, round, and reactive to light.  Cardiovascular:     Rate and Rhythm: Normal rate and regular rhythm.     Heart sounds: No murmur heard.  Pulmonary:     Effort: Pulmonary effort is normal. No respiratory distress.     Breath sounds: No rales.  Chest:     Chest wall: No tenderness.  Abdominal:     Palpations: Abdomen is soft.  Musculoskeletal:        General: Swelling present. Normal range of motion.     Cervical back: Normal range of motion and neck supple.  Skin:    General: Skin is warm and dry.     Coloration: Skin is pale.     Findings: No erythema.  Neurological:     Mental Status: She is alert. Mental status is at baseline.  Psychiatric:        Mood and Affect: Affect normal.        CMP Latest Ref Rng & Units 08/03/2019  Glucose 70 - 99 mg/dL 161(W)  BUN 8 - 23 mg/dL 17  Creatinine 9.60 - 4.54 mg/dL 0.98  Sodium 119 - 147 mmol/L 134(L)  Potassium 3.5 - 5.1 mmol/L 5.0  Chloride 98 - 111 mmol/L 106  CO2 22 - 32 mmol/L 21(L)  Calcium 8.9 - 10.3 mg/dL 7.8(L)  Total Protein 6.5 - 8.1 g/dL -  Total Bilirubin 0.3 - 1.2 mg/dL -  Alkaline Phos 38 - 829 U/L -  AST 15 - 41 U/L -  ALT 0 - 44 U/L -   CBC Latest Ref Rng & Units 08/03/2019  WBC 4.0 - 10.5 K/uL 17.7(H)  Hemoglobin 12.0 - 15.0 g/dL 6.6(L)  Hematocrit 36 - 46 % 20.7(L)  Platelets 150 - 400 K/uL 390    RADIOGRAPHIC STUDIES: I have personally reviewed the radiological images as listed and agreed with the findings in the report. CT ABDOMEN PELVIS WO CONTRAST  Result Date: 07/22/2019 CLINICAL DATA:  Sepsis EXAM: CT ABDOMEN AND PELVIS WITHOUT CONTRAST TECHNIQUE: Multidetector CT imaging of the abdomen and pelvis was performed following the standard protocol  without IV contrast. COMPARISON:  None. FINDINGS: Lower chest: There is mild cardiomegaly. Mitral valve calcifications. A moderate paraesophageal hernia containing contrast is seen. Hepatobiliary: Although limited due to the lack of intravenous contrast, normal in appearance without gross focal abnormality. The patient is status post cholecystectomy. No biliary ductal dilation. Pancreas:  Unremarkable.  No surrounding inflammatory changes. Spleen: Normal in size. Although limited due to the lack of intravenous contrast, normal in appearance. Adrenals/Urinary Tract: Both adrenal glands appear normal. The kidneys and collecting system appear normal without evidence of urinary tract calculus or hydronephrosis. Bladder is unremarkable. Stomach/Bowel: The stomach, small bowel, are normal in appearance. There is scattered colonic diverticula without diverticulitis. Vascular/Lymphatic: There are no enlarged abdominal or pelvic lymph nodes. Scattered aortic atherosclerotic calcifications are seen without aneurysmal dilatation. Reproductive: The uterus and adnexa are unremarkable. Other: A small fat and transverse colon containing umbilical hernia seen within the mid abdomen. Musculoskeletal: No acute or significant osseous findings. IMPRESSION: Moderate paraesophageal hernia. Diverticulosis without diverticulitis. No other acute intra-abdominal or pelvic pathology to explain the patient's symptoms. Aortic Atherosclerosis (ICD10-I70.0). Electronically Signed   By: Jonna Clark M.D.   On: 07/22/2019 01:40   DG Chest 1 View  Result Date: 07/27/2019 CLINICAL DATA:  Shortness of breath EXAM: CHEST  1 VIEW COMPARISON:  July 26, 2019 FINDINGS: There is unchanged mild cardiomegaly. There is aortic knob and descending intrathoracic aortic calcifications. There is slight interval improvement in the prominence of the central pulmonary vasculature. No acute osseous abnormality. IMPRESSION: Slight interval improvement in the pulmonary  vascular congestion. Electronically Signed  By: Jonna Clark M.D.   On: 07/27/2019 06:04   DG Chest 1 View  Result Date: 07/26/2019 CLINICAL DATA:  Shortness of breath. EXAM: CHEST  1 VIEW COMPARISON:  07/23/2019. FINDINGS: Mediastinum hilar structures normal. Cardiomegaly. Mild bilateral interstitial prominence and small pleural effusions. CHF could present in this fashion. Sliding hiatal hernia cannot be excluded. IMPRESSION: Cardiomegaly. Mild bilateral interstitial prominence and small pleural effusions. CHF could present in this fashion. Electronically Signed   By: Maisie Fus  Register   On: 07/26/2019 07:52   DG Chest 1 View  Result Date: 07/23/2019 CLINICAL DATA:  Dyspnea EXAM: CHEST  1 VIEW COMPARISON:  Chest radiograph from one day prior. FINDINGS: Stable cardiomediastinal silhouette with mild cardiomegaly. No pneumothorax. Stable small left pleural effusion. No right pleural effusion. No overt pulmonary edema. Streaky bibasilar lung opacities are similar IMPRESSION: Stable small left pleural effusion. Stable streaky bibasilar lung opacities, favor atelectasis or scarring. Stable mild cardiomegaly without overt pulmonary edema. Electronically Signed   By: Delbert Phenix M.D.   On: 07/23/2019 04:50   DG Chest 1 View  Result Date: 07/21/2019 CLINICAL DATA:  Sepsis. EXAM: CHEST  1 VIEW COMPARISON:  None. FINDINGS: There are small bilateral pleural effusions. There is a left perihilar density of unknown clinical significance. The heart size is mildly enlarged. There are dense aortic calcifications. There are few streaky airspace opacities at the lung bases favored to represent areas of atelectasis. There is no pneumothorax. IMPRESSION: 1. Small bilateral pleural effusions. 2. Mild cardiomegaly. 3. Slightly prominent left perihilar region may be secondary to a dilated pulmonary artery or lymphadenopathy. A follow-up two-view chest x-ray is recommended in 4-6 weeks. Electronically Signed   By: Katherine Mantle M.D.   On: 07/21/2019 22:34   DG Chest Port 1 View  Result Date: 07/22/2019 CLINICAL DATA:  Shortness of breath. EXAM: PORTABLE CHEST 1 VIEW COMPARISON:  Jul 21, 2019 FINDINGS: Cardiomegaly. Skin fold over the lateral right chest. The hila and mediastinum are normal. No pneumothorax. No nodules or masses. No focal infiltrates. IMPRESSION: No active disease. Electronically Signed   By: Gerome Sam III M.D   On: 07/22/2019 15:12   ECHOCARDIOGRAM COMPLETE  Result Date: 07/27/2019    ECHOCARDIOGRAM REPORT   Patient Name:   YAZMEN BRIONES Date of Exam: 07/26/2019 Medical Rec #:  [id number]   Height:       64.0 in Accession #:    [id number]  Weight:       164.2 lb Date of Birth:  07-05-1930    BSA:          1.799 m Patient Age:    86 years    BP:           117/65 mmHg Patient Gender: F           HR:           124 bpm. Exam Location:  [facility] Procedure: 2D Echo, Cardiac Doppler and Color Doppler Indications:     Dyspnea 786.09 / R06.00  History:         Patient has no prior history of Echocardiogram examinations.                  CAD; Risk Factors:Hypertension. MI.  Sonographer:     Neysa Bonito Roar Referring Phys:  [id number] Kateri Mc LATIF [facility] Diagnosing Phys: Lorine Bears MD IMPRESSIONS  1. Left ventricular ejection fraction, by estimation, is 40 to 45%. The left ventricle has mildly decreased function. The left ventricle demonstrates  regional wall motion abnormalities with possible anterior/anteroseptal hypokinesis. There is mild left ventricular hypertrophy. Left ventricular diastolic parameters are indeterminate.  2. Right ventricular systolic function is normal. The right ventricular size is normal. There is mildly elevated pulmonary artery systolic pressure.  3. Left atrial size was severely dilated.  4. Right atrial size was moderately dilated.  5. The mitral valve is abnormal. Moderate mitral valve regurgitation. No evidence of mitral stenosis.  6. The aortic valve is abnormal. Aortic valve regurgitation is  moderate. Mild aortic valve sclerosis is present, with no evidence of aortic valve stenosis. FINDINGS  Left Ventricle: Left ventricular ejection fraction, by estimation, is 40 to 45%. The left ventricle has mildly decreased function. The left ventricle demonstrates regional wall motion abnormalities. The left ventricular internal cavity size was normal in size. There is mild left ventricular hypertrophy. Left ventricular diastolic parameters are indeterminate. Right Ventricle: The right ventricular size is normal. No increase in right ventricular wall thickness. Right ventricular systolic function is normal. There is mildly elevated pulmonary artery systolic pressure. The tricuspid regurgitant velocity is 2.69  m/s, and with an assumed right atrial pressure of 10 mmHg, the estimated right ventricular systolic pressure is 38.9 mmHg. Left Atrium: Left atrial size was severely dilated. Right Atrium: Right atrial size was moderately dilated. Pericardium: There is no evidence of pericardial effusion. Mitral Valve: The mitral valve is abnormal. There is moderate thickening of the mitral valve leaflet(s). There is mild calcification of the mitral valve leaflet(s). Normal mobility of the mitral valve leaflets. Moderate mitral annular calcification. Moderate mitral valve regurgitation. No evidence of mitral valve stenosis. Tricuspid Valve: The tricuspid valve is normal in structure. Tricuspid valve regurgitation is not demonstrated. No evidence of tricuspid stenosis. Aortic Valve: The aortic valve is abnormal. Aortic valve regurgitation is moderate. Mild aortic valve sclerosis is present, with no evidence of aortic valve stenosis. Aortic valve mean gradient measures 7.5 mmHg. Aortic valve peak gradient measures 14.1 mmHg. Aortic valve area, by VTI measures 1.73 cm. Pulmonic Valve: The pulmonic valve was normal in structure. Pulmonic valve regurgitation is not visualized. No evidence of pulmonic stenosis. Aorta: The aortic  root is normal in size and structure. Venous: The inferior vena cava was not well visualized. IAS/Shunts: No atrial level shunt detected by color flow Doppler.  LEFT VENTRICLE PLAX 2D LVIDd:         4.47 cm  Diastology LVIDs:         3.12 cm  LV e' lateral:   6.64 cm/s LV PW:         1.14 cm  LV E/e' lateral: 23.8 LV IVS:        1.06 cm  LV e' medial:    8.81 cm/s LVOT diam:     1.70 cm  LV E/e' medial:  17.9 LV SV:         52 LV SV Index:   29 LVOT Area:     2.27 cm  RIGHT VENTRICLE RV Mid diam:    2.29 cm RV S prime:     12.70 cm/s TAPSE (M-mode): 1.6 cm LEFT ATRIUM             Index       RIGHT ATRIUM           Index LA diam:        4.20 cm 2.33 cm/m  RA Area:     19.00 cm LA Vol (A2C):   92.9 ml 51.64 ml/m RA Volume:  44.60 ml  24.79 ml/m LA Vol (A4C):   82.8 ml 46.02 ml/m LA Biplane Vol: 91.4 ml 50.80 ml/m  AORTIC VALVE                    PULMONIC VALVE AV Area (Vmax):    1.54 cm     PV Vmax:        1.10 m/s AV Area (Vmean):   1.56 cm     PV Peak grad:   4.8 mmHg AV Area (VTI):     1.73 cm     RVOT Peak grad: 2 mmHg AV Vmax:           187.50 cm/s AV Vmean:          123.000 cm/s AV VTI:            0.301 m AV Peak Grad:      14.1 mmHg AV Mean Grad:      7.5 mmHg LVOT Vmax:         127.00 cm/s LVOT Vmean:        84.500 cm/s LVOT VTI:          0.229 m LVOT/AV VTI ratio: 0.76  AORTA Ao Root diam: 2.20 cm MITRAL VALVE                TRICUSPID VALVE MV Area (PHT): 3.16 cm     TR Peak grad:   28.9 mmHg MV Decel Time: 240 msec     TR Vmax:        269.00 cm/s MV E velocity: 158.00 cm/s                             SHUNTS                             Systemic VTI:  0.23 m                             Systemic Diam: 1.70 cm Lorine BearsMuhammad Arida MD Electronically signed by Lorine BearsMuhammad Arida MD Signature Date/Time: 07/27/2019/8:13:34 AM    Final     Assessment and plan-   #Acute onset of anemia, Iron panel is not typical for iron deficiency.  Ferritin is 55. Normal vitamin B12 and folate level.-Check  MMA-pending Reticulocyte panel shows normal reticulocyte hemoglobin, not typical for iron deficiency.  Patient has increased reticulocytosis which is usually seen in hemolysis or blood loss. LDH is normal.  Haptoglobin is pending.  Bilirubin is normal.  Less likely hemolysis. CT 07/22/2019 showed no retroperitoneal hematoma She is status post upper endoscopy.  Patient was found to have a large hiatal hernia otherwise no bleeding source was discovered.  There was plan for colonoscopy for further work-up of possible GI blood loss.  Unfortunately due to her dementia, she does not cooperate with bowel prep.  Colonoscopy was cancelled twice.  Per GI no plan for inpatient colonoscopy.  GI feels that it is less likely due to GI blood loss, more likely secondary to dilutional effect due to CHF  Hemoglobin has dropped below 7, when I was at the bedside, patient is getting blood transfusion.  She was at her baseline with no obvious wheezing.  She does have bilateral lower extremity edema which is chronic for her. After I left, it appears that patient has developed wheezing and agitation.  Suspect volume overload-TACO, or TRALI.  Recommend to check Coombs testing, LDH.  Hospitalist team has ordered Lasix, stat chest x-ray and ICU evaluation.   Leukocytosis, likely reactive secondary to UTI. Peripheral blood smear did not show any blast or fragmented RBCs.  Leukoerythroblastic changes. LDH is normal.  She has baseline leukocytosis, on admission, leukocytosis was elevated at 25.  Likely reactive due to UTI.  Overall leukocytosis have decreased, however persistently higher than her baseline.  Chronic leukocytosis etiology has not been worked up previously. I will check flow cytometry.  Thank you for allowing me to participate in the care of this patient.   Earlie Server, MD, PhD Hematology Oncology [facility] at [facility] Pager- [phone] 08/03/2019

## 2019-08-03 NOTE — Plan of Care (Signed)
No complaints of pain.

## 2019-08-03 NOTE — Plan of Care (Signed)
  Problem: Education: Goal: Knowledge of General Education information will improve Description: Including pain rating scale, medication(s)/side effects and non-pharmacologic comfort measures Outcome: Progressing   Problem: Health Behavior/Discharge Planning: Goal: Ability to manage health-related needs will improve Outcome: Progressing   Problem: Clinical Measurements: Goal: Ability to maintain clinical measurements within normal limits will improve Outcome: Progressing Goal: Will remain free from infection Outcome: Progressing Goal: Diagnostic test results will improve Outcome: Progressing Goal: Respiratory complications will improve Outcome: Progressing Goal: Cardiovascular complication will be avoided Outcome: Progressing   Problem: Activity: Goal: Risk for activity intolerance will decrease Outcome: Progressing   Problem: Nutrition: Goal: Adequate nutrition will be maintained Outcome: Progressing   Problem: Coping: Goal: Level of anxiety will decrease Outcome: Progressing   Problem: Elimination: Goal: Will not experience complications related to bowel motility Outcome: Progressing Goal: Will not experience complications related to urinary retention Outcome: Progressing   Problem: Safety: Goal: Ability to remain free from injury will improve Outcome: Progressing   Problem: Skin Integrity: Goal: Risk for impaired skin integrity will decrease Outcome: Progressing   Problem: Fluid Volume: Goal: Hemodynamic stability will improve Outcome: Progressing   Problem: Clinical Measurements: Goal: Diagnostic test results will improve Outcome: Progressing Goal: Signs and symptoms of infection will decrease Outcome: Progressing   Problem: Respiratory: Goal: Ability to maintain adequate ventilation will improve Outcome: Progressing   

## 2019-08-03 NOTE — Progress Notes (Signed)
Patient having SOB at rest this morning, required neb treatment last night 2200 and this morning 0900 for expiratory wheezing.  Increased edema +2 to +4 in all extremities. Cardiology placed orders for increased Lasix dosage. Hemoglobin dropped from 7.0 to 6.6 and 1 PRBC ordered and transfused. Dr. Allegra Lai notified of this mornings events. The colonoscopy will be cancelled for today due to patient condition. Will reassess clinical condition to reschedule at a later date.

## 2019-08-04 LAB — CBC WITH DIFFERENTIAL/PLATELET
Abs Immature Granulocytes: 0.12 10*3/uL — ABNORMAL HIGH (ref 0.00–0.07)
Basophils Absolute: 0 10*3/uL (ref 0.0–0.1)
Basophils Relative: 0 %
Eosinophils Absolute: 0.1 10*3/uL (ref 0.0–0.5)
Eosinophils Relative: 0 %
HCT: 24.1 % — ABNORMAL LOW (ref 36.0–46.0)
Hemoglobin: 7.4 g/dL — ABNORMAL LOW (ref 12.0–15.0)
Immature Granulocytes: 1 %
Lymphocytes Relative: 8 %
Lymphs Abs: 1.2 10*3/uL (ref 0.7–4.0)
MCH: 28.9 pg (ref 26.0–34.0)
MCHC: 30.7 g/dL (ref 30.0–36.0)
MCV: 94.1 fL (ref 80.0–100.0)
Monocytes Absolute: 0.8 10*3/uL (ref 0.1–1.0)
Monocytes Relative: 5 %
Neutro Abs: 13.8 10*3/uL — ABNORMAL HIGH (ref 1.7–7.7)
Neutrophils Relative %: 86 %
Platelets: 311 10*3/uL (ref 150–400)
RBC: 2.56 MIL/uL — ABNORMAL LOW (ref 3.87–5.11)
RDW: 16.7 % — ABNORMAL HIGH (ref 11.5–15.5)
WBC: 16 10*3/uL — ABNORMAL HIGH (ref 4.0–10.5)
nRBC: 0.5 % — ABNORMAL HIGH (ref 0.0–0.2)

## 2019-08-04 LAB — BASIC METABOLIC PANEL
Anion gap: 8 (ref 5–15)
BUN: 18 mg/dL (ref 8–23)
CO2: 24 mmol/L (ref 22–32)
Calcium: 8.1 mg/dL — ABNORMAL LOW (ref 8.9–10.3)
Chloride: 105 mmol/L (ref 98–111)
Creatinine, Ser: 1.06 mg/dL — ABNORMAL HIGH (ref 0.44–1.00)
GFR calc Af Amer: 55 mL/min — ABNORMAL LOW (ref >60)
GFR calc non Af Amer: 47 mL/min — ABNORMAL LOW (ref >60)
Glucose, Bld: 99 mg/dL (ref 70–99)
Potassium: 4.5 mmol/L (ref 3.5–5.1)
Sodium: 137 mmol/L (ref 135–145)

## 2019-08-04 LAB — MAGNESIUM: Magnesium: 2.6 mg/dL — ABNORMAL HIGH (ref 1.7–2.4)

## 2019-08-04 MED ORDER — FUROSEMIDE 10 MG/ML IJ SOLN
40.0000 mg | Freq: Once | INTRAMUSCULAR | Status: DC
  Filled 2019-08-04: qty 4

## 2019-08-04 NOTE — Progress Notes (Signed)
Hematology/Oncology Consult note [facility]  Telephone:([phone] [phone]  Patient Care Team: Valera Castle, MD as PCP - General (Family Medicine)   Name of the patient: Kimberly Wang  [id number]  1931/01/07   Date of visit: 08/04/2019  Interval history- she is sitting up on her chair. Able to walk to the bathroom. No active bleeding noted  ECOG PS- 3 Pain scale- 0   Review of systems- Review of Systems  Constitutional: Positive for malaise/fatigue. Negative for chills, fever and weight loss.  HENT: Negative for congestion, ear discharge and nosebleeds.   Eyes: Negative for blurred vision.  Respiratory: Positive for shortness of breath. Negative for cough, hemoptysis, sputum production and wheezing.   Cardiovascular: Positive for leg swelling. Negative for chest pain, palpitations, orthopnea and claudication.  Gastrointestinal: Negative for abdominal pain, blood in stool, constipation, diarrhea, heartburn, melena, nausea and vomiting.  Genitourinary: Negative for dysuria, flank pain, frequency, hematuria and urgency.  Musculoskeletal: Negative for back pain, joint pain and myalgias.  Skin: Negative for rash.  Neurological: Negative for dizziness, tingling, focal weakness, seizures, weakness and headaches.  Endo/Heme/Allergies: Does not bruise/bleed easily.  Psychiatric/Behavioral: Negative for depression and suicidal ideas. The patient does not have insomnia.       Allergies  Allergen Reactions  . Latex Hives and Rash  . Lidocaine Hcl Other (See Comments)    Other Reaction: Other reaction. Patient can't take Novacaine  . Codeine Other (See Comments)  . Morphine Other (See Comments)  . Novocain [Procaine]     Heart races  . Other Other (See Comments)    Uncoded Allergy. Allergen: RED PEPPER     Past Medical History:  Diagnosis Date  . Coronary artery disease   . Dementia ([place])   . Hypertension   . Myocardial infarct  [facility])      Past Surgical History:  Procedure Laterality Date  . CHOLECYSTECTOMY    . CORONARY STENT PLACEMENT    . ESOPHAGOGASTRODUODENOSCOPY (EGD) WITH PROPOFOL N/A 07/31/2019   Procedure: ESOPHAGOGASTRODUODENOSCOPY (EGD) WITH PROPOFOL;  Surgeon: Lin Landsman, MD;  Location: [facility] ENDOSCOPY;  Service: Gastroenterology;  Laterality: N/A;    Social History   Socioeconomic History  . Marital status: Married    Spouse name: Not on file  . Number of children: Not on file  . Years of education: Not on file  . Highest education level: Not on file  Occupational History  . Not on file  Tobacco Use  . Smoking status: Never Smoker  . Smokeless tobacco: Never Used  Vaping Use  . Vaping Use: Never used  Substance and Sexual Activity  . Alcohol use: Never  . Drug use: Never  . Sexual activity: Not on file  Other Topics Concern  . Not on file  Social History Narrative  . Not on file   Social Determinants of Health   Financial Resource Strain:   . Difficulty of Paying Living Expenses:   Food Insecurity:   . Worried About Charity fundraiser in the Last Year:   . Arboriculturist in the Last Year:   Transportation Needs:   . Film/video editor (Medical):   Marland Kitchen Lack of Transportation (Non-Medical):   Physical Activity:   . Days of Exercise per Week:   . Minutes of Exercise per Session:   Stress:   . Feeling of Stress :   Social Connections:   . Frequency of Communication with Friends and Family:   .  Frequency of Social Gatherings with Friends and Family:   . Attends Religious Services:   . Active Member of Clubs or Organizations:   . Attends Archivist Meetings:   Marland Kitchen Marital Status:   Intimate Partner Violence:   . Fear of Current or Ex-Partner:   . Emotionally Abused:   Marland Kitchen Physically Abused:   . Sexually Abused:     History reviewed. No pertinent family history.   Current Facility-Administered Medications:  .  acetaminophen (TYLENOL) tablet 650 mg,  650 mg, Oral, Q6H PRN, 650 mg at 07/25/19 2125 **OR** acetaminophen (TYLENOL) suppository 650 mg, 650 mg, Rectal, Q6H PRN, Mansy, Jan A, MD .  atorvastatin (LIPITOR) tablet 80 mg, 80 mg, Oral, Daily, Mansy, Jan A, MD, 80 mg at 08/04/19 1004 .  diltiazem (CARDIZEM CD) 24 hr capsule 240 mg, 240 mg, Oral, Daily, Mansy, Jan A, MD, 240 mg at 08/04/19 1001 .  donepezil (ARICEPT) tablet 10 mg, 10 mg, Oral, QHS, Mansy, Jan A, MD, 10 mg at 08/03/19 2137 .  feeding supplement (ENSURE ENLIVE) (ENSURE ENLIVE) liquid 237 mL, 237 mL, Oral, BID BM, Florencia Reasons, MD, 237 mL at 08/03/19 1331 .  furosemide (LASIX) injection 40 mg, 40 mg, Intravenous, Daily, Teodoro Spray, MD, 40 mg at 08/04/19 1003 .  ipratropium-albuterol (DUONEB) 0.5-2.5 (3) MG/3ML nebulizer solution 3 mL, 3 mL, Nebulization, Q6H PRN, Lang Snow, NP, 3 mL at 08/03/19 1619 .  magnesium hydroxide (MILK OF MAGNESIA) suspension 30 mL, 30 mL, Oral, Daily PRN, Mansy, Jan A, MD .  megestrol (MEGACE) 400 MG/10ML suspension 400 mg, 400 mg, Oral, Daily, Allie Bossier, MD, 400 mg at 08/04/19 1003 .  melatonin tablet 5 mg, 5 mg, Oral, Once, Sharion Settler, NP .  memantine [facility]) tablet 10 mg, 10 mg, Oral, Daily, Mansy, Jan A, MD, 10 mg at 08/04/19 1002 .  multivitamin with minerals tablet 1 tablet, 1 tablet, Oral, Daily, Hart Robinsons A, RPH, 1 tablet at 08/04/19 1002 .  nitroGLYCERIN (NITROSTAT) SL tablet 0.4 mg, 0.4 mg, Sublingual, Q5 min PRN, Mansy, Jan A, MD .  ondansetron (ZOFRAN) tablet 4 mg, 4 mg, Oral, Q6H PRN **OR** ondansetron (ZOFRAN) injection 4 mg, 4 mg, Intravenous, Q6H PRN, Mansy, Jan A, MD, 4 mg at 08/02/19 1948 .  pantoprazole (PROTONIX) injection 40 mg, 40 mg, Intravenous, Q12H, Sheikh, Omair Latif, DO, 40 mg at 08/04/19 1003 .  QUEtiapine (SEROQUEL) tablet 25 mg, 25 mg, Oral, QHS PRN, Lang Snow, NP, 25 mg at 08/03/19 2137 .  sertraline (ZOLOFT) tablet 50 mg, 50 mg, Oral, QHS, Oswald Hillock, RPH, 50 mg at  08/03/19 2138 .  traZODone (DESYREL) tablet 25 mg, 25 mg, Oral, QHS PRN, Mansy, Jan A, MD, 25 mg at 08/03/19 2138  Physical exam:  Vitals:   08/03/19 1927 08/04/19 0500 08/04/19 0643 08/04/19 0758  BP: 95/75  (!) 117/53 114/81  Pulse: 67  91 (!) 50  Resp: 18  20   Temp: (!) 97.5 F (36.4 C)  98.2 F (36.8 C) 97.9 F (36.6 C)  TempSrc: Oral  Oral Oral  SpO2: 97%  100% 98%  Weight:  173 lb (78.5 kg)    Height:       Physical Exam Constitutional:      General: She is not in acute distress.    Appearance: She is obese.  HENT:     Head: Normocephalic and atraumatic.  Cardiovascular:     Rate and Rhythm: Normal rate and regular rhythm.  Heart sounds: Normal heart sounds.  Pulmonary:     Effort: Pulmonary effort is normal.     Breath sounds: Normal breath sounds.  Abdominal:     General: Bowel sounds are normal.     Palpations: Abdomen is soft.  Musculoskeletal:     Right lower leg: Edema present.     Left lower leg: Edema present.  Skin:    General: Skin is warm and dry.  Neurological:     Mental Status: She is alert and oriented to person, place, and time.      CMP Latest Ref Rng & Units 08/04/2019  Glucose 70 - 99 mg/dL 99  BUN 8 - 23 mg/dL 18  Creatinine 0.44 - 1.00 mg/dL 1.06(H)  Sodium 135 - 145 mmol/L 137  Potassium 3.5 - 5.1 mmol/L 4.5  Chloride 98 - 111 mmol/L 105  CO2 22 - 32 mmol/L 24  Calcium 8.9 - 10.3 mg/dL 8.1(L)  Total Protein 6.5 - 8.1 g/dL -  Total Bilirubin 0.3 - 1.2 mg/dL -  Alkaline Phos 38 - 126 U/L -  AST 15 - 41 U/L -  ALT 0 - 44 U/L -   CBC Latest Ref Rng & Units 08/04/2019  WBC 4.0 - 10.5 K/uL 16.0(H)  Hemoglobin 12.0 - 15.0 g/dL 7.4(L)  Hematocrit 36 - 46 % 24.1(L)  Platelets 150 - 400 K/uL 311    _0 @  CT ABDOMEN PELVIS WO CONTRAST  Result Date: 07/22/2019 CLINICAL DATA:  Sepsis EXAM: CT ABDOMEN AND PELVIS WITHOUT CONTRAST TECHNIQUE: Multidetector CT imaging of the abdomen and pelvis was performed following the standard  protocol without IV contrast. COMPARISON:  None. FINDINGS: Lower chest: There is mild cardiomegaly. Mitral valve calcifications. A moderate paraesophageal hernia containing contrast is seen. Hepatobiliary: Although limited due to the lack of intravenous contrast, normal in appearance without gross focal abnormality. The patient is status post cholecystectomy. No biliary ductal dilation. Pancreas:  Unremarkable.  No surrounding inflammatory changes. Spleen: Normal in size. Although limited due to the lack of intravenous contrast, normal in appearance. Adrenals/Urinary Tract: Both adrenal glands appear normal. The kidneys and collecting system appear normal without evidence of urinary tract calculus or hydronephrosis. Bladder is unremarkable. Stomach/Bowel: The stomach, small bowel, are normal in appearance. There is scattered colonic diverticula without diverticulitis. Vascular/Lymphatic: There are no enlarged abdominal or pelvic lymph nodes. Scattered aortic atherosclerotic calcifications are seen without aneurysmal dilatation. Reproductive: The uterus and adnexa are unremarkable. Other: A small fat and transverse colon containing umbilical hernia seen within the mid abdomen. Musculoskeletal: No acute or significant osseous findings. IMPRESSION: Moderate paraesophageal hernia. Diverticulosis without diverticulitis. No other acute intra-abdominal or pelvic pathology to explain the patient's symptoms. Aortic Atherosclerosis (ICD10-I70.0). Electronically Signed   By: Prudencio Pair M.D.   On: 07/22/2019 01:40   DG Chest 1 View  Result Date: 07/27/2019 CLINICAL DATA:  Shortness of breath EXAM: CHEST  1 VIEW COMPARISON:  July 26, 2019 FINDINGS: There is unchanged mild cardiomegaly. There is aortic knob and descending intrathoracic aortic calcifications. There is slight interval improvement in the prominence of the central pulmonary vasculature. No acute osseous abnormality. IMPRESSION: Slight interval improvement in the  pulmonary vascular congestion. Electronically Signed   By: Prudencio Pair M.D.   On: 07/27/2019 06:04   DG Chest 1 View  Result Date: 07/26/2019 CLINICAL DATA:  Shortness of breath. EXAM: CHEST  1 VIEW COMPARISON:  07/23/2019. FINDINGS: Mediastinum hilar structures normal. Cardiomegaly. Mild bilateral interstitial prominence and small pleural effusions. CHF could present  in this fashion. Sliding hiatal hernia cannot be excluded. IMPRESSION: Cardiomegaly. Mild bilateral interstitial prominence and small pleural effusions. CHF could present in this fashion. Electronically Signed   By: Marcello Moores  Register   On: 07/26/2019 07:52   DG Chest 1 View  Result Date: 07/23/2019 CLINICAL DATA:  Dyspnea EXAM: CHEST  1 VIEW COMPARISON:  Chest radiograph from one day prior. FINDINGS: Stable cardiomediastinal silhouette with mild cardiomegaly. No pneumothorax. Stable small left pleural effusion. No right pleural effusion. No overt pulmonary edema. Streaky bibasilar lung opacities are similar IMPRESSION: Stable small left pleural effusion. Stable streaky bibasilar lung opacities, favor atelectasis or scarring. Stable mild cardiomegaly without overt pulmonary edema. Electronically Signed   By: Ilona Sorrel M.D.   On: 07/23/2019 04:50   DG Chest 1 View  Result Date: 07/21/2019 CLINICAL DATA:  Sepsis. EXAM: CHEST  1 VIEW COMPARISON:  None. FINDINGS: There are small bilateral pleural effusions. There is a left perihilar density of unknown clinical significance. The heart size is mildly enlarged. There are dense aortic calcifications. There are few streaky airspace opacities at the lung bases favored to represent areas of atelectasis. There is no pneumothorax. IMPRESSION: 1. Small bilateral pleural effusions. 2. Mild cardiomegaly. 3. Slightly prominent left perihilar region may be secondary to a dilated pulmonary artery or lymphadenopathy. A follow-up two-view chest x-ray is recommended in 4-6 weeks. Electronically Signed   By:  Constance Holster M.D.   On: 07/21/2019 22:34   DG Chest Port 1 View  Result Date: 08/03/2019 CLINICAL DATA:  Cough EXAM: PORTABLE CHEST 1 VIEW COMPARISON:  07/27/2019 FINDINGS: Cardiac shadow remains enlarged. Aortic calcifications are again seen. Small bilateral pleural effusions are noted stable from the prior exam. No focal infiltrate is noted. No acute bony abnormality is seen. IMPRESSION: Small effusions bilaterally.  No acute abnormality noted. Electronically Signed   By: Inez Catalina M.D.   On: 08/03/2019 17:17   DG Chest Port 1 View  Result Date: 07/22/2019 CLINICAL DATA:  Shortness of breath. EXAM: PORTABLE CHEST 1 VIEW COMPARISON:  Jul 21, 2019 FINDINGS: Cardiomegaly. Skin fold over the lateral right chest. The hila and mediastinum are normal. No pneumothorax. No nodules or masses. No focal infiltrates. IMPRESSION: No active disease. Electronically Signed   By: Dorise Bullion III M.D   On: 07/22/2019 15:12   ECHOCARDIOGRAM COMPLETE  Result Date: 07/27/2019    ECHOCARDIOGRAM REPORT   Patient Name:   ALAYSHIA MARINI Date of Exam: 07/26/2019 Medical Rec #:  [id number]   Height:       64.0 in Accession #:    [id number]  Weight:       164.2 lb Date of Birth:  1930-11-01    BSA:          1.799 m Patient Age:    51 years    BP:           117/65 mmHg Patient Gender: F           HR:           124 bpm. Exam Location:  [facility] Procedure: 2D Echo, Cardiac Doppler and Color Doppler Indications:     Dyspnea 786.09 / R06.00  History:         Patient has no prior history of Echocardiogram examinations.                  CAD; Risk Factors:Hypertension. MI.  Sonographer:     Alyse Low Roar Referring Phys:  [id number] [facility] Diagnosing Phys:  Kathlyn Sacramento MD IMPRESSIONS  1. Left ventricular ejection fraction, by estimation, is 40 to 45%. The left ventricle has mildly decreased function. The left ventricle demonstrates regional wall motion abnormalities with possible anterior/anteroseptal hypokinesis. There is mild  left ventricular hypertrophy. Left ventricular diastolic parameters are indeterminate.  2. Right ventricular systolic function is normal. The right ventricular size is normal. There is mildly elevated pulmonary artery systolic pressure.  3. Left atrial size was severely dilated.  4. Right atrial size was moderately dilated.  5. The mitral valve is abnormal. Moderate mitral valve regurgitation. No evidence of mitral stenosis.  6. The aortic valve is abnormal. Aortic valve regurgitation is moderate. Mild aortic valve sclerosis is present, with no evidence of aortic valve stenosis. FINDINGS  Left Ventricle: Left ventricular ejection fraction, by estimation, is 40 to 45%. The left ventricle has mildly decreased function. The left ventricle demonstrates regional wall motion abnormalities. The left ventricular internal cavity size was normal in size. There is mild left ventricular hypertrophy. Left ventricular diastolic parameters are indeterminate. Right Ventricle: The right ventricular size is normal. No increase in right ventricular wall thickness. Right ventricular systolic function is normal. There is mildly elevated pulmonary artery systolic pressure. The tricuspid regurgitant velocity is 2.69  m/s, and with an assumed right atrial pressure of 10 mmHg, the estimated right ventricular systolic pressure is 27.0 mmHg. Left Atrium: Left atrial size was severely dilated. Right Atrium: Right atrial size was moderately dilated. Pericardium: There is no evidence of pericardial effusion. Mitral Valve: The mitral valve is abnormal. There is moderate thickening of the mitral valve leaflet(s). There is mild calcification of the mitral valve leaflet(s). Normal mobility of the mitral valve leaflets. Moderate mitral annular calcification. Moderate mitral valve regurgitation. No evidence of mitral valve stenosis. Tricuspid Valve: The tricuspid valve is normal in structure. Tricuspid valve regurgitation is not demonstrated. No  evidence of tricuspid stenosis. Aortic Valve: The aortic valve is abnormal. Aortic valve regurgitation is moderate. Mild aortic valve sclerosis is present, with no evidence of aortic valve stenosis. Aortic valve mean gradient measures 7.5 mmHg. Aortic valve peak gradient measures 14.1 mmHg. Aortic valve area, by VTI measures 1.73 cm. Pulmonic Valve: The pulmonic valve was normal in structure. Pulmonic valve regurgitation is not visualized. No evidence of pulmonic stenosis. Aorta: The aortic root is normal in size and structure. Venous: The inferior vena cava was not well visualized. IAS/Shunts: No atrial level shunt detected by color flow Doppler.  LEFT VENTRICLE PLAX 2D LVIDd:         4.47 cm  Diastology LVIDs:         3.12 cm  LV e' lateral:   6.64 cm/s LV PW:         1.14 cm  LV E/e' lateral: 23.8 LV IVS:        1.06 cm  LV e' medial:    8.81 cm/s LVOT diam:     1.70 cm  LV E/e' medial:  17.9 LV SV:         52 LV SV Index:   29 LVOT Area:     2.27 cm  RIGHT VENTRICLE RV Mid diam:    2.29 cm RV S prime:     12.70 cm/s TAPSE (M-mode): 1.6 cm LEFT ATRIUM             Index       RIGHT ATRIUM           Index LA diam:  4.20 cm 2.33 cm/m  RA Area:     19.00 cm LA Vol (A2C):   92.9 ml 51.64 ml/m RA Volume:   44.60 ml  24.79 ml/m LA Vol (A4C):   82.8 ml 46.02 ml/m LA Biplane Vol: 91.4 ml 50.80 ml/m  AORTIC VALVE                    PULMONIC VALVE AV Area (Vmax):    1.54 cm     PV Vmax:        1.10 m/s AV Area (Vmean):   1.56 cm     PV Peak grad:   4.8 mmHg AV Area (VTI):     1.73 cm     RVOT Peak grad: 2 mmHg AV Vmax:           187.50 cm/s AV Vmean:          123.000 cm/s AV VTI:            0.301 m AV Peak Grad:      14.1 mmHg AV Mean Grad:      7.5 mmHg LVOT Vmax:         127.00 cm/s LVOT Vmean:        84.500 cm/s LVOT VTI:          0.229 m LVOT/AV VTI ratio: 0.76  AORTA Ao Root diam: 2.20 cm MITRAL VALVE                TRICUSPID VALVE MV Area (PHT): 3.16 cm     TR Peak grad:   28.9 mmHg MV Decel Time:  240 msec     TR Vmax:        269.00 cm/s MV E velocity: 158.00 cm/s                             SHUNTS                             Systemic VTI:  0.23 m                             Systemic Diam: 1.70 cm Kathlyn Sacramento MD Electronically signed by Kathlyn Sacramento MD Signature Date/Time: 07/27/2019/8:13:34 AM    Final      Assessment and plan- Patient is a 86 y.o. female admitted for altered mental status. Hematology consulted for anemia  Patient noted to have a steadily declining hemoglobin which was 13.8 on 07/21/2019 down to 7 requiring blood transfusion.  Her haptoglobin is normal and reticulocyte production index is less than 2.  Total bilirubin is normal.  This does not indicate any hemolysis.  This is hypoproliferative anemia.  Iron studies are indicative of combination of iron deficiency and anemia of chronic disease.  Coombs test was negative B12 and folate is normal.  Serum copper was normal.  Patient could not take her colonoscopy prep which was now canceled twice and there is no plans for colonoscopy as an inpatient.  If hemoglobin continues to decline bone marrow biopsy is a consideration. However 1 would not expect such an acute drop with a primary bone marrow process.  Continue supportive transfusions.  She is currently being treated for CHF  Leukocytosis mainly neutrophilia likely reactive.  Platelet counts remain normal  There was a concern for possibleTACO/ TRALI after blood transfusion yesterday.  Chest x-ray showed mild bilateral pleural effusion but no acute lung pathology.     Visit Diagnosis 1. Sepsis due to urinary tract infection ([place])   2. Sepsis ([place])   3. AKI (acute kidney injury) ([place])   4. Hypokalemia   5. Hyponatremia   6. Demand ischemia ([place])   7. Shortness of breath   8. SOB (shortness of breath)   9. Anemia, unspecified type   10. Cough      Dr. Randa Evens, MD, MPH [facility] [phone] 08/04/2019 10:39 AM

## 2019-08-04 NOTE — Progress Notes (Signed)
Physical Therapy Treatment Patient Details Name: Kimberly Wang MRN: [id number] DOB: 03/06/1930 Today's Date: 08/04/2019    History of Present Illness Patient is an 86 year old female who presented to the emergency room with acute onset of altered mental status with generalized weakness and confusion and significant diminish p.o. intake, vomiting. Chest x-ray showed bilateral small pleural effusions with mild cardiomegaly. Found to have spesis/UTI. PMH includes Parkinson's dementia, CAD, HTN, MI.     PT Comments    Patient agrees to PT treatment. She performs seated LAQ, hip flex x 10 x 2 . She performed sit to stand with RW x 3 repetitions . She has good sitting balance an fair standing balance She has deceased O2 saturation to 87 % in standing. She will continue to benefit from skilled PT to improve mobility and strength.   Follow Up Recommendations  SNF     Equipment Recommendations  Rolling walker with 5" wheels    Recommendations for Other Services       Precautions / Restrictions Restrictions Weight Bearing Restrictions: No    Mobility  Bed Mobility                  Transfers Overall transfer level: Needs assistance Equipment used: 1 person hand held assist   Sit to Stand: Min assist         General transfer comment: sit to stand with vC for safety  Ambulation/Gait Ambulation/Gait assistance:  (O2 decreased to 87%,)               Stairs             Wheelchair Mobility    Modified Rankin (Stroke Patients Only)       Balance Overall balance assessment: Needs assistance Sitting-balance support: Bilateral upper extremity supported;Feet supported Sitting balance-Leahy Scale: Good     Standing balance support: Bilateral upper extremity supported Standing balance-Leahy Scale: Good                              Cognition Arousal/Alertness: Awake/alert Behavior During Therapy: WFL for tasks assessed/performed Overall Cognitive  Status: Within Functional Limits for tasks assessed                                 General Comments: Pt is pleasantly confused and disoriented about recent events, time, and situation.  Pt follows single step commands without difficulty.  Pt with decreased short term memory, often repeating questions and stories.      Exercises Total Joint Exercises Marching in Standing:  (20 BLE) General Exercises - Lower Extremity Long Arc Quad: Strengthening;Right;Left;Both;20 reps    General Comments        Pertinent Vitals/Pain Pain Assessment: No/denies pain    Home Living                      Prior Function            PT Goals (current goals can now be found in the care plan section) Acute Rehab PT Goals Patient Stated Goal: to return to prior level of function  Progress towards PT goals: Progressing toward goals    Frequency    Min 2X/week      PT Plan Current plan remains appropriate    Co-evaluation              AM-PAC PT "6  Clicks" Mobility   Outcome Measure  Help needed turning from your back to your side while in a flat bed without using bedrails?: A Little Help needed moving from lying on your back to sitting on the side of a flat bed without using bedrails?: A Little Help needed moving to and from a bed to a chair (including a wheelchair)?: A Lot Help needed standing up from a chair using your arms (e.g., wheelchair or bedside chair)?: A Lot Help needed to walk in hospital room?: A Lot Help needed climbing 3-5 steps with a railing? : A Little 6 Click Score: 15    End of Session Equipment Utilized During Treatment: Gait belt Activity Tolerance: Other (comment) (O2 saturation decreased in standing) Patient left: in bed Nurse Communication: Mobility status PT Visit Diagnosis: Unsteadiness on feet (R26.81);Muscle weakness (generalized) (M62.81);Difficulty in walking, not elsewhere classified (R26.2)     Time: 9941-2904 PT Time  Calculation (min) (ACUTE ONLY): 25 min  Charges:  $Gait Training: 8-22 mins $Therapeutic Activity: 8-22 mins                        Ezekiel Ina, PT DPT 08/04/2019, 4:13 PM

## 2019-08-04 NOTE — Progress Notes (Signed)
[facility]    SUBJECTIVE: States to be doing reasonably well irregularly irregular heart rate patient asking for ice tea denies any pain no further bleeding feels reasonably well   Vitals:   08/03/19 1927 08/04/19 0500 08/04/19 0643 08/04/19 0758  BP: 95/75  (!) 117/53 114/81  Pulse: 67  91 (!) 50  Resp: 18  20   Temp: (!) 97.5 F (36.4 C)  98.2 F (36.8 C) 97.9 F (36.6 C)  TempSrc: Oral  Oral Oral  SpO2: 97%  100% 98%  Weight:  78.5 kg    Height:         Intake/Output Summary (Last 24 hours) at 08/04/2019 1112 Last data filed at 08/04/2019 1015 Gross per 24 hour  Intake 1510 ml  Output --  Net 1510 ml      PHYSICAL EXAM  General: Well developed, well nourished, in no acute distress HEENT:  Normocephalic and atramatic Neck:  No JVD.  Lungs: Clear bilaterally to auscultation and percussion. Heart: Irregular irregular. S1 and S2 without gallops or murmurs.  Abdomen: Bowel sounds are positive, abdomen soft and non-tender  Msk:  Back normal, normal gait. Normal strength and tone for age. Extremities: No clubbing, cyanosis or edema.   Neuro: Alert and oriented X 3. Psych:  Good affect, responds appropriately   LABS: Basic Metabolic Panel: Recent Labs    08/03/19 0637 08/04/19 0634  NA 134* 137  K 5.0 4.5  CL 106 105  CO2 21* 24  GLUCOSE 103* 99  BUN 17 18  CREATININE 1.00 1.06*  CALCIUM 7.8* 8.1*  MG 2.9* 2.6*   Liver Function Tests: No results for input(s): AST, ALT, ALKPHOS, BILITOT, PROT, ALBUMIN in the last 72 hours. No results for input(s): LIPASE, AMYLASE in the last 72 hours. CBC: Recent Labs    08/03/19 0637 08/04/19 0634  WBC 17.7* 16.0*  NEUTROABS 14.9* 13.8*  HGB 6.6* 7.4*  HCT 20.7* 24.1*  MCV 94.5 94.1  PLT 390 311   Cardiac Enzymes: No results for input(s): CKTOTAL, CKMB, CKMBINDEX, TROPONINI in the last 72 hours. BNP: Invalid input(s): POCBNP D-Dimer: No results for input(s): DDIMER in the last 72 hours. Hemoglobin  A1C: No results for input(s): HGBA1C in the last 72 hours. Fasting Lipid Panel: No results for input(s): CHOL, HDL, LDLCALC, TRIG, CHOLHDL, LDLDIRECT in the last 72 hours. Thyroid Function Tests: No results for input(s): TSH, T4TOTAL, T3FREE, THYROIDAB in the last 72 hours.  Invalid input(s): FREET3 Anemia Panel: Recent Labs    08/01/19 1225  RETICCTPCT 7.4*    DG Chest Port 1 View  Result Date: 08/03/2019 CLINICAL DATA:  Cough EXAM: PORTABLE CHEST 1 VIEW COMPARISON:  07/27/2019 FINDINGS: Cardiac shadow remains enlarged. Aortic calcifications are again seen. Small bilateral pleural effusions are noted stable from the prior exam. No focal infiltrate is noted. No acute bony abnormality is seen. IMPRESSION: Small effusions bilaterally.  No acute abnormality noted. Electronically Signed   By: Inez Catalina M.D.   On: 08/03/2019 17:17     Echo mildly reduced overall left ventricular function around 40 to 45%  TELEMETRY: Atrial fibrillation rate control  ASSESSMENT AND PLAN:  Active Problems:   Coagulation defect ([place])   Sepsis due to gram-negative UTI (HCC)   Severe sepsis ([place])   Dementia due to Parkinson's disease without behavioral disturbance (HCC)   Benign essential HTN   CAD (coronary artery disease)   Anemia   Sepsis due to urinary tract infection (HCC)   A-fib ([facility]CC) GI bleeding .  Plan Continue to correct anemia with transfusions Recommend continue to hold anticoagulation for A. Fib Maintain hypertension control Dementia mild/moderate chronic continue current therapy Follow-up with GIs input for lower GI bleeding Do not recommend any invasive strategy at this point Continue current therapy for sepsis    Alwyn Pea, MD 08/04/2019 11:12 AM

## 2019-08-04 NOTE — Progress Notes (Signed)
PROGRESS NOTE    Kimberly Wang  XTK:[id number] DOB: June 13, 1930 DOA: 07/22/2019 PCP: Kimberly Housekeeper, MD   Brief Narrative:  HPI On 07/22/2019 by Kimberly Wang Kimberly Wang a88 y.o.femalewith a known history of coronary artery disease, hypertension and dementia, who presented to the emergency room with acute onset of altered mental status with generalized weakness and confusion and significant diminish p.o. intake especially today. She vomited once. She was having low-grade fever without chills. She denied any abdominal pain. No significant dysuria, hematuria or urgency or frequency or flank pain. No cough or wheezing or dyspnea.  Upon presentation to the emergency room, temperature was 100.2 and heart rate 101 with otherwise normal vital signs. CMP revealed hyponatremia and hypokalemia as well as hypochloremia with blood glucose of 223, BUN of 18 and creatinine 1.14, albumin of 3.4. Lactic acid was 3.8 and later 3.7. High-sensitivity troponin I was 60. CBC showed leukocytosis of 25.1 with neutrophilia. Urinalysis showed 6-10 WBCs with positive nitrite and trace leukocytes. Blood cultures were drawn and urine culture was sent. Abdominal and pelvic CT scan revealed paraesophageal hernia with no acute findings. Chest x-ray showed bilateral small pleural effusions with mild cardiomegaly.  The patient was given 1 g of p.o. Tylenol, a gram of IV cefepime and a gram of IV vancomycin, 1 L of lactated Ringer, 10 medical IV potassium chloride and 40 mEq p.o. as well as 1 L IV normal saline. She will be admitted to the progressive unit bed for further evaluation and management.  Interim history  Remains pleasantly demented but will need PT OT to further evaluate and treat and now they are recommending SNF. Antibiotics have been changed to p.o.  She was wheezing a little bit her IV fluids have been stopped and she is given a dose of IV Lasix the day before yesterday and again yesterday and  will be given another one today. Cardiology was consulted for concern of CHF and Volume overload. She remains in Atrial Fibrillation but has had some faster heart rates.  Because of concern for GI bleed as her hemoglobin has dropped significantly along with an elevated BUN gastroenterology was consulted and they feel that the patient needs further hematological work-up to determine etiology of her anemia before proceeding with any endoscopic procedure.  Patient is unsure about having endoscopic procedure.  Fecal occult test has been pending.  Her Eliquis has been held for now.  Will need to continue monitor for signs and symptoms of bleeding.  Cardiology she still is not in overt heart failure recommending continuing to carefully diurese and following renal function and symptoms given that she is still 6.5 L positive.  Cardiology recommends discharging from their standpoint on current medications with Lipitor, diltiazem and K-Dur 20 milliequivalents and following up with Kimberly [facility] at [facility]  Hematology was consulted given her acute anemia and Kimberly Wang assisting and he recommends checking an MMA which is currently pending.  He is also recommended checking a haptoglobin which is also pending but he feels that hemolysis is less likely.  Because her hemoglobin continues to decrease further he recommends transfusion if it is below 7 and increasing will iron supplementation to twice daily.  He feels that we will need to rule out GI bleeding and cardiology recommending resuming Eliquis 5 mg twice daily as they do not feel that she is bleeding, however with her continuing drop in hemoglobin I do not feel that this is secondary to hydration and will hold until we figure out the  etiology of her blood count being lower   Assessment & Plan   Severe sepsis secondary to Kimberly Wang urinary tract infection ( on presentation, resolved now) -Present on admission.  Patient with leukocytosis, tachycardia, tachypnea, elevated lactic  acid AMS. -Patient is completed antibiotic course   Leukocytosis  Persistent and  somewhat fluctuating.   Previous hospitalist discussed with Kimberly Wang and pathologist, Kimberly Wang, who reviewed patient's smear and was noted to have no blasts or fragmented to PVCs.   Likely reactive per hematology she needs to follow up with hematology  Acute metabolic encephalopathy superimposed on chronic dementia -Patient presented with altered mental status.  Suspect this is secondary to the above -Continue Aricept, memantine, Seroquel -Minimize sedating medications -She is pleasantly confused and cooperative in the last few days, now appear at baseline confusion.  Atrial fibrillation with RVR -Cardiology consulted and appreciated, recommended continuing diltiazem -Eliquis has been held due to concern of GI bleed as patient's hemoglobin continues to drop -Heart rate stable Will follow cardiology recommendation  Acute on chronic diastolic heart failure - patient presented with sepsis and was given IV fluids ---remain volume overloaded,  cards change oral Lasix back to IV Lasix, given additional iv lasix this pm -Continue to monitor intake and output, daily weights -she is on coreg, spironolactone at home , she is not currently on this,  Cardiology aware  Hyponatremia -Suspect multifactorial including volume depletion, SSRI, anorexia -Patient was on IV fluids however this was discontinued -Zoloft has been held -Continue Megace -Resolved -Continue to monitor BMP  Elevated troponin/demand ischemia -With history of CAD status post PCI in 2006 -Relatively flat, peaked to 64.  Not consistent with ACS.  Suspect demand ischemia. -Continue statin -Eliquis  Held, is not compliant with colonoscopy prep, colonoscopy is canceled plan to have it done outpatient, resume Eliquis today, monitor sign of bleeding and H&H  Normocytic anemia/possible GI bleed -Was on Eliquis prior to admission as well as oral  iron which has been held -Anemia panel showed iron 30, ferritin 55, folate 14.8, vitamin B12 440 -Previous hospitalist discussed with hematology, KimberlyYu.  Not feel that the iron panel is typical for iron deficiency.  Recommended pending reticulocyte panel and obtain LDH and haptoglobin.  LDH was normal.  Increased reticulocytosis can be seen acute GI bleed or hemolysis.  T bilirubin is normal, less likely hemolysis. -Gastroenterology consulted and appreciated -Hemoglobin this morning down to 7.2 -Status post EGD: Large radial hernia, normal duodenal bulb and second portion of duodenum.  Normal stomach biopsied.  Normal gastroesophageal junction and esophagus.   -Eliquis  Held, is not compliant with colonoscopy prep, -Family insist to have colonoscopy done in the hospital, Eliquis held, colonoscopy per GI  Essential hypertension -Continue Cardizem, lasix -she is on coreg, spironolactone at home , she is not currently on this,  Cardiology aware  Dyslipidemia -LDL 38, continue statin  Elevated BUN -Improved, normalized  Hypomagnesemia/hypokalemia/hypophosphatemia -Resolved with repletion, continue to monitor and replace as needed  Deconditioning/generalized weakness -PT and OT recommending home health  DVT Prophylaxis  SCDs  Code Status: Full code  Family Communication: daughter Kimberly Wang at  bedside  Disposition Plan:  Status is: Inpatient  Remains inpatient appropriate because:Inpatient level of care appropriate due to severity of illness.  Needs diuresis  as she has wheezing and edema.   Dispo: The patient is from: Home              Anticipated d/c is to: SNF vs Home with [facility]  Anticipated d/c date is:1- 2 days needs cards and GI clearance                Consultants Cardiology Gastroenterology Hematology  Procedures  EGD PRBC transfusionx1 on 6/11  Antibiotics   Anti-infectives (From admission, onward)   Start     Dose/Rate Route Frequency Ordered Stop    07/26/19 0600  cephALEXin (KEFLEX) capsule 500 mg        500 mg Oral Every 6 hours 07/25/19 1718 07/29/19 0559   07/22/19 0900  cefTRIAXone (ROCEPHIN) 1 g in sodium chloride 0.9 % 100 mL IVPB  Status:  Discontinued        1 g 200 mL/hr over 30 Minutes Intravenous Every 24 hours 07/22/19 0248 07/25/19 1714   07/22/19 0145  vancomycin (VANCOCIN) IVPB 1000 mg/200 mL premix        1,000 mg 200 mL/hr over 60 Minutes Intravenous  Once 07/22/19 0144 07/22/19 0349   07/22/19 0045  ceFEPIme (MAXIPIME) 1 g in sodium chloride 0.9 % 100 mL IVPB        1 g 200 mL/hr over 30 Minutes Intravenous  Once 07/22/19 0039 07/22/19 0202      Subjective:   Kimberly Wang seen and examined today.  Patient with no complaints , she is sitting up in chair, she is pleasantly confused She wants to eat Daughter Kimberly Wang at bedside Objective:   Vitals:   08/04/19 0643 08/04/19 0758 08/04/19 0800 08/04/19 1114  BP: (!) 117/53 114/81  131/63  Pulse: 91 (!) 50  69  Resp: 20     Temp: 98.2 F (36.8 C) 97.9 F (36.6 C)  98.3 F (36.8 C)  TempSrc: Oral Oral  Oral  SpO2: 100% 98% 100% 100%  Weight:      Height:        Intake/Output Summary (Last 24 hours) at 08/04/2019 1446 Last data filed at 08/04/2019 1015 Gross per 24 hour  Intake 600 ml  Output --  Net 600 ml   Filed Weights   08/01/19 0653 08/03/19 1100 08/04/19 0500  Weight: 77.3 kg 74.3 kg 78.5 kg    Exam  General: pleasantly confused elderly, chronically ill-appearing, NAD  HEENT: NCAT,  mucous membranes moist.   Cardiovascular: S1 S2 auscultated, irregular  Respiratory: upper airway wheezing mostly, does has mild wheezing lower lung fields   Abdomen: Soft, nontender, nondistended, + bowel sounds  Extremities: warm dry without cyanosis clubbing. +++ lower extremity edema.  Neuro: AAOx2 (person, place), has short-term memory deficits.  Nonfocal  Skin: Without rashes exudates or nodules  Psych: Pleasantly confused   Data Reviewed: I have  personally reviewed following labs and imaging studies  CBC: Recent Labs  Lab 07/31/19 0505 08/01/19 0643 08/02/19 0549 08/03/19 0637 08/04/19 0634  WBC 12.5* 14.3* 14.4* 17.7* 16.0*  NEUTROABS 9.8* 11.7* 11.7* 14.9* 13.8*  HGB 7.2* 7.2* 7.0* 6.6* 7.4*  HCT 22.4* 23.0* 22.6* 20.7* 24.1*  MCV 94.9 97.0 97.0 94.5 94.1  PLT 329 345 313 390 311   Basic Metabolic Panel: Recent Labs  Lab 07/29/19 0731 07/29/19 0731 07/29/19 0856 07/29/19 0856 07/30/19 0700 07/30/19 0700 07/31/19 0505 08/01/19 0643 08/02/19 0549 08/03/19 0637 08/04/19 0634  NA  --   --  135   < > 136  --   --  138 138 134* 137  K  --   --  4.5   < > 4.6  --   --  4.8 4.6 5.0 4.5  CL  --   --  105   < > 104  --   --  107 108 106 105  CO2  --   --  22   < > 23  --   --  24 22 21* 24  GLUCOSE  --   --  105*   < > 119*  --   --  103* 90 103* 99  BUN  --   --  19   < > 18  --   --  16 21 17 18   CREATININE  --   --  0.73   < > 0.80  --   --  0.87 0.84 1.00 1.06*  CALCIUM  --   --  8.6*   < > 8.6*  --   --  8.4* 8.0* 7.8* 8.1*  MG 2.3   < > 2.2   < > 2.3   < > 2.4 2.4 2.6* 2.9* 2.6*  PHOS 3.3  --  3.3  --  3.5  --  3.6 3.6  --   --   --    < > = values in this interval not displayed.   GFR: Estimated Creatinine Clearance: 37.9 mL/min (A) (by C-G formula based on SCr of 1.06 mg/dL (H)). Liver Function Tests: Recent Labs  Lab 07/29/19 0856 07/30/19 0700  AST 31 33  ALT 41 41  ALKPHOS 69 72  BILITOT 0.8 0.6  PROT 5.5* 5.6*  ALBUMIN 2.7* 2.7*   No results for input(s): LIPASE, AMYLASE in the last 168 hours. No results for input(s): AMMONIA in the last 168 hours. Coagulation Profile: No results for input(s): INR, PROTIME in the last 168 hours. Cardiac Enzymes: No results for input(s): CKTOTAL, CKMB, CKMBINDEX, TROPONINI in the last 168 hours. BNP (last 3 results) No results for input(s): PROBNP in the last 8760 hours. HbA1C: No results for input(s): HGBA1C in the last 72 hours. CBG: Recent Labs  Lab  07/31/19 1922  GLUCAP 135*   Lipid Profile: No results for input(s): CHOL, HDL, LDLCALC, TRIG, CHOLHDL, LDLDIRECT in the last 72 hours. Thyroid Function Tests: No results for input(s): TSH, T4TOTAL, FREET4, T3FREE, THYROIDAB in the last 72 hours. Anemia Panel: No results for input(s): VITAMINB12, FOLATE, FERRITIN, TIBC, IRON, RETICCTPCT in the last 72 hours. Urine analysis:    Component Value Date/Time   COLORURINE YELLOW (A) 07/22/2019 0054   APPEARANCEUR HAZY (A) 07/22/2019 0054   LABSPEC 1.012 07/22/2019 0054   PHURINE 5.0 07/22/2019 0054   GLUCOSEU NEGATIVE 07/22/2019 0054   HGBUR MODERATE (A) 07/22/2019 0054   BILIRUBINUR NEGATIVE 07/22/2019 0054   KETONESUR NEGATIVE 07/22/2019 0054   PROTEINUR NEGATIVE 07/22/2019 0054   NITRITE POSITIVE (A) 07/22/2019 0054   LEUKOCYTESUR TRACE (A) 07/22/2019 0054   Sepsis Labs: @LABRCNTIP (procalcitonin:4,lacticidven:4)  ) No results found for this or any previous visit (from the past 240 hour(s)).    Radiology Studies: DG Chest Port 1 View  Result Date: 08/03/2019 CLINICAL DATA:  Cough EXAM: PORTABLE CHEST 1 VIEW COMPARISON:  07/27/2019 FINDINGS: Cardiac shadow remains enlarged. Aortic calcifications are again seen. Small bilateral pleural effusions are noted stable from the prior exam. No focal infiltrate is noted. No acute bony abnormality is seen. IMPRESSION: Small effusions bilaterally.  No acute abnormality noted. Electronically Signed   By: 10/03/2019 M.D.   On: 08/03/2019 17:17     Scheduled Meds: . atorvastatin  80 mg Oral Daily  . diltiazem  240 mg Oral Daily  . donepezil  10 mg Oral QHS  .  feeding supplement (ENSURE ENLIVE)  237 mL Oral BID BM  . furosemide  40 mg Intravenous Daily  . megestrol  400 mg Oral Daily  . melatonin  5 mg Oral Once  . memantine  10 mg Oral Daily  . multivitamin with minerals  1 tablet Oral Daily  . pantoprazole (PROTONIX) IV  40 mg Intravenous Q12H  . sertraline  50 mg Oral QHS    Continuous Infusions:    LOS: 13 days   Time Spent in minutes   45 minutes  Albertine Grates MD PhD FACPon 08/04/2019 at 2:46 PM  Between 7am to 7pm - Please see pager noted on [email or web address]  After 7pm go to [email or web address]  And look for the night coverage person covering for me after hours  [facility] Office  [phone]

## 2019-08-05 LAB — BPAM RBC
Blood Product Expiration Date: 202107022359
ISSUE DATE / TIME: 202106111016
Unit Type and Rh: 6200

## 2019-08-05 LAB — TYPE AND SCREEN
ABO/RH(D): AB POS
Antibody Screen: NEGATIVE
Unit division: 0

## 2019-08-05 LAB — BASIC METABOLIC PANEL
Anion gap: 10 (ref 5–15)
BUN: 18 mg/dL (ref 8–23)
CO2: 22 mmol/L (ref 22–32)
Calcium: 7.7 mg/dL — ABNORMAL LOW (ref 8.9–10.3)
Chloride: 102 mmol/L (ref 98–111)
Creatinine, Ser: 0.95 mg/dL (ref 0.44–1.00)
GFR calc Af Amer: >60 mL/min (ref >60)
GFR calc non Af Amer: 54 mL/min — ABNORMAL LOW (ref >60)
Glucose, Bld: 102 mg/dL — ABNORMAL HIGH (ref 70–99)
Potassium: 3.6 mmol/L (ref 3.5–5.1)
Sodium: 134 mmol/L — ABNORMAL LOW (ref 135–145)

## 2019-08-05 LAB — GLUCOSE, CAPILLARY: Glucose-Capillary: 100 mg/dL — ABNORMAL HIGH (ref 70–99)

## 2019-08-05 LAB — MAGNESIUM: Magnesium: 2.3 mg/dL (ref 1.7–2.4)

## 2019-08-05 MED ORDER — DILTIAZEM HCL 30 MG PO TABS
30.0000 mg | ORAL_TABLET | Freq: Once | ORAL | Status: AC
  Administered 2019-08-05: 30 mg via ORAL
  Filled 2019-08-05: qty 1

## 2019-08-05 MED ORDER — METOPROLOL TARTRATE 25 MG PO TABS
12.5000 mg | ORAL_TABLET | Freq: Two times a day (BID) | ORAL | Status: DC
  Administered 2019-08-05 – 2019-08-10 (×9): 12.5 mg via ORAL
  Filled 2019-08-05 (×9): qty 1

## 2019-08-05 MED ORDER — POTASSIUM CHLORIDE CRYS ER 20 MEQ PO TBCR
40.0000 meq | EXTENDED_RELEASE_TABLET | Freq: Once | ORAL | Status: AC
  Administered 2019-08-05: 40 meq via ORAL
  Filled 2019-08-05: qty 2

## 2019-08-05 NOTE — Progress Notes (Signed)
   08/05/19 1556  Assess: MEWS Score  Temp 97.8 F (36.6 C)  BP 135/79  Pulse Rate (!) 121  Resp 18  SpO2 98 %  Assess: MEWS Score  MEWS Temp 0  MEWS Systolic 0  MEWS Pulse 2  MEWS RR 0  MEWS LOC 0  MEWS Score 2  MEWS Score Color Yellow  Assess: if the MEWS score is Yellow or Red  Were vital signs taken at a resting state? Yes  Focused Assessment Documented focused assessment  Early Detection of Sepsis Score *See Row Information* Low  MEWS guidelines implemented *See Row Information* No, previously yellow, continue vital signs every 4 hours  Notify: Charge Nurse/RN  Name of Charge Nurse/RN Notified Judeth Cornfield RN  Date Charge Nurse/RN Notified 08/05/19  Time Charge Nurse/RN Notified 1637  Notify: Provider  Provider Name/Title Dr. Roda Shutters  Date Provider Notified 08/05/19  Time Provider Notified 1600  Notification Type Page (secure Chat)  Notification Reason Other (Comment) (For PRN for HR)  Response See new orders  Date of Provider Response 08/05/19  Time of Provider Response 1638  Patient is asymptomatic-  Denies CP, worsening SOB & lightheadedness

## 2019-08-05 NOTE — Progress Notes (Addendum)
PROGRESS NOTE    Kimberly Wang  ELF:[id number] DOB: [birth year] DOA: 07/22/2019 PCP: [name], MD   Brief Narrative:  HPI On 07/22/2019 by Dr. Valente David Kimberly Wang a88 y.o.femalewith a known history of coronary artery disease, hypertension and dementia, who presented to the emergency room with acute onset of altered mental status with generalized weakness and confusion and significant diminish p.o. intake especially today. She vomited once. She was having low-grade fever without chills. She denied any abdominal pain. No significant dysuria, hematuria or urgency or frequency or flank pain. No cough or wheezing or dyspnea.  Upon presentation to the emergency room, temperature was 100.2 and heart rate 101 with otherwise normal vital signs. CMP revealed hyponatremia and hypokalemia as well as hypochloremia with blood glucose of 223, BUN of 18 and creatinine 1.14, albumin of 3.4. Lactic acid was 3.8 and later 3.7. High-sensitivity troponin I was 60. CBC showed leukocytosis of 25.1 with neutrophilia. Urinalysis showed 6-10 WBCs with positive nitrite and trace leukocytes. Blood cultures were drawn and urine culture was sent. Abdominal and pelvic CT scan revealed paraesophageal hernia with no acute findings. Chest x-ray showed bilateral small pleural effusions with mild cardiomegaly.  The patient was given 1 g of p.o. Tylenol, a gram of IV cefepime and a gram of IV vancomycin, 1 L of lactated Ringer, 10 medical IV potassium chloride and 40 mEq p.o. as well as 1 L IV normal saline. She will be admitted to the progressive unit bed for further evaluation and management.  Interim history   Presents with Sepsis secondary to UTI.  Resolved  Found to have Anemia, status post EGD 6/8.  Patient is not able to cooperate with bowel prep, GI signed off and recommend outpatient colonoscopy  Hematology following, also has chronic leukocytosis A. fib with RVR, Eliquis held. Remains  volume overloaded, getting IV diuresis, likely able to discharge to skilled nursing facility once approaching euvolemic. Cardiology following   Assessment & Plan   Severe sepsis secondary to E. coli urinary tract infection ( on presentation, resolved now) -Present on admission.  Patient with leukocytosis, tachycardia, tachypnea, elevated lactic acid AMS. -Patient is completed antibiotic course   Leukocytosis  Persistent and  somewhat fluctuating.   Previous hospitalist discussed with Dr. Cathie Hoops and pathologist, Dr. Burgess Estelle, who reviewed patient's smear and was noted to have no blasts or fragmented to PVCs.   Likely reactive per hematology she needs to follow up with hematology  Acute metabolic encephalopathy superimposed on chronic dementia -Patient presented with altered mental status.  Suspect this is secondary to the above -Continue Aricept, memantine, Seroquel -Minimize sedating medications -She is pleasantly confused and cooperative in the last few days, now appear at baseline confusion.  Atrial fibrillation with RVR -Cardiology consulted and appreciated, recommended continuing diltiazem -Eliquis has been held due to concern of GI bleed as patient's hemoglobin continues to drop -started to have tachycardia on 6/13 pm, continue cardizem, home meds coreg held since admission ,cardiology aware, will add on lopressor for now -Will follow cardiology recommendation  Acute on chronic diastolic heart failure, currently significantly volume overloaded , admission weight 160 pounds, today patient weight 168 pounds on 6/13 - patient presented with sepsis and was given IV fluids ---remain volume overloaded,  cards change oral Lasix back to IV Lasix, given additional iv lasix in pm last two days -Continue to monitor intake and output, daily weights -she is on coreg, spironolactone at home , she is not currently on this,  Cardiology aware  Elevated troponin/demand ischemia -With history of  CAD status post PCI in 2006 -Relatively flat, peaked to 64.  Not consistent with ACS.  Suspect demand ischemia. -Continue statin -denies chest pain  Normocytic anemia/possible GI bleed -Was on Eliquis prior to admission as well as oral iron which has been held -Anemia panel showed iron 30, ferritin 55, folate 14.8, vitamin B12 440 -Previous hospitalist discussed with hematology, Dr.Yu.  Not feel that the iron panel is typical for iron deficiency.  Recommended pending reticulocyte panel and obtain LDH and haptoglobin.  LDH was normal.  Increased reticulocytosis can be seen acute GI bleed or hemolysis.  T bilirubin is normal, less likely hemolysis. -Gastroenterology consulted and appreciated -Hemoglobin nadir at 6.6, received one unit prbc -Status post EGD: Large radial hernia, normal duodenal bulb and second portion of duodenum.  Normal stomach biopsied.  Normal gastroesophageal junction and esophagus.   -Eliquis  Held, is not compliant with colonoscopy prep,GI recommend outpatient colonoscopy and signed off,  -monitor h/h  Essential hypertension -Continue Cardizem, lasix -she is on coreg, spironolactone at home , she is not currently on this,  Cardiology aware  Dyslipidemia -LDL 38, continue statin  Elevated BUN, bun 50 on presentation - normalized  Hyponatremia, sodium 130 on presentation -Suspect multifactorial including volume depletion, SSRI, anorexia,  -Patient was on IV fluids however this was discontinued -Zoloft has been held -Continue Megace -Resolved -Continue to monitor BMP  Hypomagnesemia/hypokalemia/hypophosphatemia -Resolved with repletion, continue to monitor and replace as needed  Deconditioning/generalized weakness -PT and OT recommending home health then snf  DVT Prophylaxis  SCDs  Code Status: Full code  Family Communication: daughter AMY at  Bedside on 6/10, 6/11  Disposition Plan:  Status is: Inpatient  Remains inpatient appropriate  because:Inpatient level of care appropriate due to severity of illness. Significantly volume overloaded,  Needs diuresis  as she has wheezing and edema.   Dispo: The patient is from: Home              Anticipated d/c is to: SNF vs Home with [facility]              Anticipated d/c date is:1- 2 days needs cards /gi and hematology clearance                Consultants Cardiology Gastroenterology Hematology  Procedures  EGD PRBC transfusionx1 on 6/11  Antibiotics   Anti-infectives (From admission, onward)   Start     Dose/Rate Route Frequency Ordered Stop   07/26/19 0600  cephALEXin (KEFLEX) capsule 500 mg        500 mg Oral Every 6 hours 07/25/19 1718 07/29/19 0559   07/22/19 0900  cefTRIAXone (ROCEPHIN) 1 g in sodium chloride 0.9 % 100 mL IVPB  Status:  Discontinued        1 g 200 mL/hr over 30 Minutes Intravenous Every 24 hours 07/22/19 0248 07/25/19 1714   07/22/19 0145  vancomycin (VANCOCIN) IVPB 1000 mg/200 mL premix        1,000 mg 200 mL/hr over 60 Minutes Intravenous  Once 07/22/19 0144 07/22/19 0349   07/22/19 0045  ceFEPIme (MAXIPIME) 1 g in sodium chloride 0.9 % 100 mL IVPB        1 g 200 mL/hr over 30 Minutes Intravenous  Once 07/22/19 0039 07/22/19 0202      Subjective:   Kimberly Wang seen and examined today.  Patient with no complaints , she is sitting up in chair, she is pleasantly confused She is tolerating regular diet  No wheezing noticed during encounter, remain significant edema in both legs, 1liter urine output documented  Objective:   Vitals:   08/04/19 2018 08/05/19 0500 08/05/19 0526 08/05/19 0723  BP: 134/80  109/83 111/72  Pulse: 71  94 (!) 101  Resp: 20  (!) 24 17  Temp: 97.9 F (36.6 C)  99.2 F (37.3 C) 97.7 F (36.5 C)  TempSrc: Oral  Oral   SpO2: 96%  98% 98%  Weight:  76.3 kg    Height:        Intake/Output Summary (Last 24 hours) at 08/05/2019 0758 Last data filed at 08/05/2019 1950 Gross per 24 hour  Intake 600 ml  Output 1000 ml  Net  -400 ml   Filed Weights   08/03/19 1100 08/04/19 0500 08/05/19 0500  Weight: 74.3 kg 78.5 kg 76.3 kg    Exam  General: pleasantly confused elderly, chronically ill-appearing, NAD  HEENT: NCAT,  mucous membranes moist.   Cardiovascular: S1 S2 auscultated, irregular  Respiratory: occasional upper airway wheezing , mild wheezing lower lung fields has resolved, improve aeration overall   Abdomen: Soft, nontender, nondistended, + bowel sounds  Extremities: warm dry without cyanosis clubbing. +++ lower extremity edema.  Neuro: AAOx2 (person, place), has short-term memory deficits.  Nonfocal  Skin: Without rashes exudates or nodules  Psych: Pleasantly confused   Data Reviewed: I have personally reviewed following labs and imaging studies  CBC: Recent Labs  Lab 07/31/19 0505 08/01/19 0643 08/02/19 0549 08/03/19 0637 08/04/19 0634  WBC 12.5* 14.3* 14.4* 17.7* 16.0*  NEUTROABS 9.8* 11.7* 11.7* 14.9* 13.8*  HGB 7.2* 7.2* 7.0* 6.6* 7.4*  HCT 22.4* 23.0* 22.6* 20.7* 24.1*  MCV 94.9 97.0 97.0 94.5 94.1  PLT 329 345 313 390 311   Basic Metabolic Panel: Recent Labs  Lab 07/29/19 0856 07/29/19 0856 07/30/19 0700 07/30/19 0700 07/31/19 0505 08/01/19 0643 08/02/19 0549 08/03/19 0637 08/04/19 0634 08/05/19 0457  NA 135   < > 136   < >  --  138 138 134* 137 134*  K 4.5   < > 4.6   < >  --  4.8 4.6 5.0 4.5 3.6  CL 105   < > 104   < >  --  107 108 106 105 102  CO2 22   < > 23   < >  --  24 22 21* 24 22  GLUCOSE 105*   < > 119*   < >  --  103* 90 103* 99 102*  BUN 19   < > 18   < >  --  16 21 17 18 18   CREATININE 0.73   < > 0.80   < >  --  0.87 0.84 1.00 1.06* 0.95  CALCIUM 8.6*   < > 8.6*   < >  --  8.4* 8.0* 7.8* 8.1* 7.7*  MG 2.2   < > 2.3   < > 2.4 2.4 2.6* 2.9* 2.6* 2.3  PHOS 3.3  --  3.5  --  3.6 3.6  --   --   --   --    < > = values in this interval not displayed.   GFR: Estimated Creatinine Clearance: 41.7 mL/min (by C-G formula based on SCr of 0.95  mg/dL). Liver Function Tests: Recent Labs  Lab 07/29/19 0856 07/30/19 0700  AST 31 33  ALT 41 41  ALKPHOS 69 72  BILITOT 0.8 0.6  PROT 5.5* 5.6*  ALBUMIN 2.7* 2.7*   No results for  input(s): LIPASE, AMYLASE in the last 168 hours. No results for input(s): AMMONIA in the last 168 hours. Coagulation Profile: No results for input(s): INR, PROTIME in the last 168 hours. Cardiac Enzymes: No results for input(s): CKTOTAL, CKMB, CKMBINDEX, TROPONINI in the last 168 hours. BNP (last 3 results) No results for input(s): PROBNP in the last 8760 hours. HbA1C: No results for input(s): HGBA1C in the last 72 hours. CBG: Recent Labs  Lab 07/31/19 1922  GLUCAP 135*   Lipid Profile: No results for input(s): CHOL, HDL, LDLCALC, TRIG, CHOLHDL, LDLDIRECT in the last 72 hours. Thyroid Function Tests: No results for input(s): TSH, T4TOTAL, FREET4, T3FREE, THYROIDAB in the last 72 hours. Anemia Panel: No results for input(s): VITAMINB12, FOLATE, FERRITIN, TIBC, IRON, RETICCTPCT in the last 72 hours. Urine analysis:    Component Value Date/Time   COLORURINE YELLOW (A) 07/22/2019 0054   APPEARANCEUR HAZY (A) 07/22/2019 0054   LABSPEC 1.012 07/22/2019 0054   PHURINE 5.0 07/22/2019 0054   GLUCOSEU NEGATIVE 07/22/2019 0054   HGBUR MODERATE (A) 07/22/2019 0054   BILIRUBINUR NEGATIVE 07/22/2019 0054   KETONESUR NEGATIVE 07/22/2019 0054   PROTEINUR NEGATIVE 07/22/2019 0054   NITRITE POSITIVE (A) 07/22/2019 0054   LEUKOCYTESUR TRACE (A) 07/22/2019 0054   Sepsis Labs: @LABRCNTIP (procalcitonin:4,lacticidven:4)  ) No results found for this or any previous visit (from the past 240 hour(s)).    Radiology Studies: DG Chest Port 1 View  Result Date: 08/03/2019 CLINICAL DATA:  Cough EXAM: PORTABLE CHEST 1 VIEW COMPARISON:  07/27/2019 FINDINGS: Cardiac shadow remains enlarged. Aortic calcifications are again seen. Small bilateral pleural effusions are noted stable from the prior exam. No focal  infiltrate is noted. No acute bony abnormality is seen. IMPRESSION: Small effusions bilaterally.  No acute abnormality noted. Electronically Signed   By: 09/26/2019 M.D.   On: 08/03/2019 17:17     Scheduled Meds: . atorvastatin  80 mg Oral Daily  . diltiazem  240 mg Oral Daily  . donepezil  10 mg Oral QHS  . feeding supplement (ENSURE ENLIVE)  237 mL Oral BID BM  . furosemide  40 mg Intravenous Daily  . furosemide  40 mg Intravenous Once  . megestrol  400 mg Oral Daily  . melatonin  5 mg Oral Once  . memantine  10 mg Oral Daily  . multivitamin with minerals  1 tablet Oral Daily  . pantoprazole (PROTONIX) IV  40 mg Intravenous Q12H  . sertraline  50 mg Oral QHS   Continuous Infusions:    LOS: 14 days   Time Spent in minutes   45 minutes  10/03/2019 MD PhD FACPon 08/05/2019 at 7:58 AM  Between 7am to 7pm - Please see pager noted on [email or web address]  After 7pm go to [email or web address]  And look for the night coverage person covering for me after hours  [facility] Office  [phone]

## 2019-08-05 NOTE — Plan of Care (Signed)
  Problem: Activity: Goal: Risk for activity intolerance will decrease Outcome: Progressing   Problem: Safety: Goal: Ability to remain free from injury will improve Outcome: Progressing   

## 2019-08-06 LAB — CBC WITH DIFFERENTIAL/PLATELET
Abs Immature Granulocytes: 0.19 10*3/uL — ABNORMAL HIGH (ref 0.00–0.07)
Basophils Absolute: 0.1 10*3/uL (ref 0.0–0.1)
Basophils Relative: 0 %
Eosinophils Absolute: 0 10*3/uL (ref 0.0–0.5)
Eosinophils Relative: 0 %
HCT: 26.8 % — ABNORMAL LOW (ref 36.0–46.0)
Hemoglobin: 8.8 g/dL — ABNORMAL LOW (ref 12.0–15.0)
Immature Granulocytes: 1 %
Lymphocytes Relative: 6 %
Lymphs Abs: 1.2 10*3/uL (ref 0.7–4.0)
MCH: 30.3 pg (ref 26.0–34.0)
MCHC: 32.8 g/dL (ref 30.0–36.0)
MCV: 92.4 fL (ref 80.0–100.0)
Monocytes Absolute: 0.9 10*3/uL (ref 0.1–1.0)
Monocytes Relative: 5 %
Neutro Abs: 17.6 10*3/uL — ABNORMAL HIGH (ref 1.7–7.7)
Neutrophils Relative %: 88 %
Platelets: 334 10*3/uL (ref 150–400)
RBC: 2.9 MIL/uL — ABNORMAL LOW (ref 3.87–5.11)
RDW: 18.7 % — ABNORMAL HIGH (ref 11.5–15.5)
WBC: 19.9 10*3/uL — ABNORMAL HIGH (ref 4.0–10.5)
nRBC: 0.2 % (ref 0.0–0.2)

## 2019-08-06 LAB — RETIC PANEL
Immature Retic Fract: 30.6 % — ABNORMAL HIGH (ref 2.3–15.9)
RBC.: 2.96 MIL/uL — ABNORMAL LOW (ref 3.87–5.11)
Retic Count, Absolute: 229.4 10*3/uL — ABNORMAL HIGH (ref 19.0–186.0)
Retic Ct Pct: 7.8 % — ABNORMAL HIGH (ref 0.4–3.1)
Reticulocyte Hemoglobin: 32.7 pg (ref >27.9)

## 2019-08-06 LAB — MAGNESIUM: Magnesium: 2.3 mg/dL (ref 1.7–2.4)

## 2019-08-06 MED ORDER — FUROSEMIDE 10 MG/ML IJ SOLN
40.0000 mg | Freq: Two times a day (BID) | INTRAMUSCULAR | Status: DC
  Administered 2019-08-06: 40 mg via INTRAVENOUS
  Filled 2019-08-06 (×2): qty 4

## 2019-08-06 MED ORDER — SODIUM CHLORIDE 0.9% FLUSH
3.0000 mL | Freq: Two times a day (BID) | INTRAVENOUS | Status: DC
  Administered 2019-08-06 – 2019-08-10 (×8): 3 mL via INTRAVENOUS

## 2019-08-06 MED ORDER — POTASSIUM CHLORIDE CRYS ER 20 MEQ PO TBCR
20.0000 meq | EXTENDED_RELEASE_TABLET | Freq: Every day | ORAL | Status: DC
  Administered 2019-08-06 – 2019-08-07 (×2): 20 meq via ORAL
  Filled 2019-08-06 (×2): qty 1

## 2019-08-06 NOTE — Progress Notes (Signed)
Occupational Therapy Treatment Patient Details Name: Kimberly Wang MRN: [id number] DOB: [birth year] Today's Date: 08/06/2019    History of present illness Patient is an 86 year old female who presented to the emergency room with acute onset of altered mental status with generalized weakness and confusion and significant diminish p.o. intake, vomiting. Chest x-ray showed bilateral small pleural effusions with mild cardiomegaly. Found to have spesis/UTI. PMH includes Parkinson's dementia, CAD, HTN, MI.    OT comments  Pt seen for OT tx this date. Goals reviewed and remain appropriate for pt; date for goal achievement updated to reflect pt's progress. Pt pleasant, mildly confused, and repeats herself when asking about dinner and what she ordered 5 min prior with OT's assist to use phone and read menu. Of note, pt missing her reading glasses. RN notified after this therapist was unable to locate in the room. BLE noted with significant 3+ pitting edema. Pt continues to require Min A for LB ADL tasks. Some SOB noted throughout session, O2 sats on room air WNL. Pt had BLE lowered while in recliner. Pt educated in importance of BLE elevation to promote edema mgt. Foot rest elevated. L sock doffed once noted to be saturated and foot seeping fluid. RN notified promptly. Pt continues to benefit from skilled OT services. Continue to recommend STR at discharge to support optional return to PLOF and minimize risk of future falls, increased caregiver burden, and readmission.    Follow Up Recommendations  Supervision/Assistance - 24 hour;SNF    Equipment Recommendations  3 in 1 bedside commode    Recommendations for Other Services      Precautions / Restrictions Precautions Precautions: Fall       Mobility Bed Mobility               General bed mobility comments: deferred, up in recliner at start and end of session  Transfers Overall transfer level: Needs assistance Equipment used: 1 person hand  held assist Transfers: Sit to/from Stand Sit to Stand: Min assist              Balance Overall balance assessment: Needs assistance Sitting-balance support: Feet supported;No upper extremity supported;Single extremity supported Sitting balance-Leahy Scale: Good Sitting balance - Comments: no LOB while sitting and donning/doffing socks   Standing balance support: Bilateral upper extremity supported Standing balance-Leahy Scale: Fair                             ADL either performed or assessed with clinical judgement   ADL Overall ADL's : Needs assistance/impaired Eating/Feeding: Set up;Sitting   Grooming: Set up;Sitting               Lower Body Dressing: Minimal assistance;Sit to/from stand Lower Body Dressing Details (indicate cue type and reason): Pt able to sit and doff/don socks on B feet. Significant edema noted and increasing difficulty and time needed to complete task                     Vision Baseline Vision/History: Wears glasses Wears Glasses: Reading only (pt cannot locate reading glasses, RN notified) Patient Visual Report: No change from baseline     Perception     Praxis      Cognition Arousal/Alertness: Awake/alert Behavior During Therapy: WFL for tasks assessed/performed Overall Cognitive Status: History of cognitive impairments - at baseline  General Comments: Pt pleasantly confused, repeats questions and stories at times, can follow commands with cues PRN        Exercises Other Exercises Other Exercises: Pt educated in activity pacing and task modification to support safety/indep with ADL tasks while minimizing SOB/over exertion. Pt will require additional instruction to support recall and carryover Other Exercises: Pt educated in BLE positioning in recliner to promote edema mgt, +3 pitting edema noted bilaterally with L foot noted to be seeping (sock saturated) - RN notified  and sock removed, BLE elevated   Shoulder Instructions       General Comments      Pertinent Vitals/ Pain       Pain Assessment: No/denies pain  Home Living                                          Prior Functioning/Environment              Frequency  Min 1X/week        Progress Toward Goals  OT Goals(current goals can now be found in the care plan section)  Progress towards OT goals: Progressing toward goals  Acute Rehab OT Goals Patient Stated Goal: to return to prior level of function  OT Goal Formulation: With patient Time For Goal Achievement: 08/20/19 Potential to Achieve Goals: Good  Plan Discharge plan remains appropriate;Frequency remains appropriate    Co-evaluation                 AM-PAC OT "6 Clicks" Daily Activity     Outcome Measure   Help from another person eating meals?: None Help from another person taking care of personal grooming?: None Help from another person toileting, which includes using toliet, bedpan, or urinal?: A Little Help from another person bathing (including washing, rinsing, drying)?: A Little Help from another person to put on and taking off regular upper body clothing?: None Help from another person to put on and taking off regular lower body clothing?: A Little 6 Click Score: 21    End of Session    OT Visit Diagnosis: Unsteadiness on feet (R26.81);Other symptoms and signs involving cognitive function   Activity Tolerance Patient tolerated treatment well   Patient Left in chair;with call bell/phone within reach;with chair alarm set   Nurse Communication Other (comment) (BLE edema, pt can't find her reading glasses)        Time: 7001-7494 OT Time Calculation (min): 40 min  Charges: OT General Charges $OT Visit: 1 Visit OT Treatments $Self Care/Home Management : 38-52 mins  Jeni Salles, MPH, MS, OTR/L ascom [phone] 08/06/19, 4:21 PM

## 2019-08-06 NOTE — Progress Notes (Signed)
PROGRESS NOTE    Kimberly Wang  ZWC:[id number]  DOB: 01/25/1931  PCP: [name], MD Admit date:07/22/2019 86 y.o.F with h/o CAD, HTN and dementia, presented to the emergency room with acute onset of AMS, generalized weakness, concern for poor oral intake and vomiting 1 associated with low grade fever . ED-Course: Temp 100.2 and heart rate 101 with otherwise normal vital signs. CMP revealed hyponatremia and hypokalemia as well as hypochloremia with blood glucose of 223, BUN of 18 and creatinine 1.14, albumin of 3.4. Lactic acid was 3.8 and later 3.7. High-sensitivity troponin I was 60. CBC showed leukocytosis of 25.1 with neutrophilia. Urinalysis showed 6-10 WBCs with positive nitrite and trace leukocytes. Blood cultures were drawn and urine culture was sent. Abdominal and pelvic CT scan revealed paraesophageal hernia with no acute findings. Chest x-ray showed bilateral small pleural effusions with mild cardiomegaly. Patient received IV fluids, IV cefepime and a gram of IV vancomycin. Hospital course: Patient admitted to [facility] for further evaluation and management of UTI with sepsis. HC complicated by worsening anemia requiring PRBC transfusion and EGD on 6/8, volume overload requiring IV diuresis and A fib with RVR. Prompting cardiology evaluation   Subjective:  Patient sitting in bedside chair.  Still has significant leg swellings and appears somewhat dyspneic on talking full sentences although claims breathing feels at her baseline.  On room air and saturating well.  Objective: Vitals:   08/05/19 2137 08/06/19 0000 08/06/19 0651 08/06/19 0725  BP: 123/66 130/75 136/70 117/82  Pulse: (!) 106  (!) 106 (!) 105  Resp: 19 18 18 18   Temp: 98.9 F (37.2 C) 98.5 F (36.9 C) 97.7 F (36.5 C) 98.5 F (36.9 C)  TempSrc: Oral Oral Oral   SpO2: 99% 99% 100% 94%  Weight:   79.8 kg   Height:        Intake/Output Summary (Last 24 hours) at 08/06/2019 0909 Last data filed at  08/05/2019 2200 Gross per 24 hour  Intake 840 ml  Output 875 ml  Net -35 ml   Filed Weights   08/04/19 0500 08/05/19 0500 08/06/19 0651  Weight: 78.5 kg 76.3 kg 79.8 kg    Physical Examination:  General exam: Appears comfortable, on room air Respiratory system: Clear to auscultation. Respiratory effort normal. Cardiovascular system: S1 & S2 heard, RRR. No JVD, murmurs, rubs, gallops or clicks.  3+ pedal edema. Gastrointestinal system: Abdomen is obese, nondistended, soft and nontender. Normal bowel sounds  Central nervous system: Alert and oriented. No new focal neurological deficits. Extremities: No contractures, but has 3+ pitting edema bilaterally Skin: No rashes, lesions or ulcers Psychiatry: Judgement and insight appear normal. Mood & affect upbeat.   Data Reviewed: I have personally reviewed following labs and imaging studies  CBC: Recent Labs  Lab 08/01/19 0643 08/02/19 0549 08/03/19 0637 08/04/19 0634 08/06/19 0639  WBC 14.3* 14.4* 17.7* 16.0* 19.9*  NEUTROABS 11.7* 11.7* 14.9* 13.8* 17.6*  HGB 7.2* 7.0* 6.6* 7.4* 8.8*  HCT 23.0* 22.6* 20.7* 24.1* 26.8*  MCV 97.0 97.0 94.5 94.1 92.4  PLT 345 313 390 311 334   Basic Metabolic Panel: Recent Labs  Lab 07/31/19 0505 07/31/19 0505 08/01/19 0643 08/01/19 0643 08/02/19 0549 08/03/19 0637 08/04/19 0634 08/05/19 0457 08/06/19 0639  NA  --   --  138  --  138 134* 137 134*  --   K  --   --  4.8  --  4.6 5.0 4.5 3.6  --   CL  --   --  107  --  108 106 105 102  --   CO2  --   --  24  --  22 21* 24 22  --   GLUCOSE  --   --  103*  --  90 103* 99 102*  --   BUN  --   --  16  --  21 17 18 18   --   CREATININE  --   --  0.87  --  0.84 1.00 1.06* 0.95  --   CALCIUM  --   --  8.4*  --  8.0* 7.8* 8.1* 7.7*  --   MG 2.4   < > 2.4   < > 2.6* 2.9* 2.6* 2.3 2.3  PHOS 3.6  --  3.6  --   --   --   --   --   --    < > = values in this interval not displayed.   GFR: Estimated Creatinine Clearance: 42.6 mL/min (by C-G formula  based on SCr of 0.95 mg/dL). Liver Function Tests: No results for input(s): AST, ALT, ALKPHOS, BILITOT, PROT, ALBUMIN in the last 168 hours. No results for input(s): LIPASE, AMYLASE in the last 168 hours. No results for input(s): AMMONIA in the last 168 hours. Coagulation Profile: No results for input(s): INR, PROTIME in the last 168 hours. Cardiac Enzymes: No results for input(s): CKTOTAL, CKMB, CKMBINDEX, TROPONINI in the last 168 hours. BNP (last 3 results) No results for input(s): PROBNP in the last 8760 hours. HbA1C: No results for input(s): HGBA1C in the last 72 hours. CBG: Recent Labs  Lab 07/31/19 1922 08/05/19 2150  GLUCAP 135* 100*   Lipid Profile: No results for input(s): CHOL, HDL, LDLCALC, TRIG, CHOLHDL, LDLDIRECT in the last 72 hours. Thyroid Function Tests: No results for input(s): TSH, T4TOTAL, FREET4, T3FREE, THYROIDAB in the last 72 hours. Anemia Panel: No results for input(s): VITAMINB12, FOLATE, FERRITIN, TIBC, IRON, RETICCTPCT in the last 72 hours. Sepsis Labs: No results for input(s): PROCALCITON, LATICACIDVEN in the last 168 hours.  No results found for this or any previous visit (from the past 240 hour(s)).    Radiology Studies: No results found.      Scheduled Meds: . atorvastatin  80 mg Oral Daily  . diltiazem  240 mg Oral Daily  . donepezil  10 mg Oral QHS  . feeding supplement (ENSURE ENLIVE)  237 mL Oral BID BM  . furosemide  40 mg Intravenous Daily  . furosemide  40 mg Intravenous Once  . megestrol  400 mg Oral Daily  . melatonin  5 mg Oral Once  . memantine  10 mg Oral Daily  . metoprolol tartrate  12.5 mg Oral BID  . multivitamin with minerals  1 tablet Oral Daily  . pantoprazole (PROTONIX) IV  40 mg Intravenous Q12H  . sertraline  50 mg Oral QHS   Continuous Infusions:   Assessment/Plan:  Severe sepsis secondary to E. coli urinary tract infection: Present on admission. Patient with leukocytosis, tachycardia, tachypnea,  elevated lactic acid and AMS on admission.Patient has completed antibiotic course. Afebrile now although has persistent/fluctuating leucocytosis. No blasts or fragmented cells on peripheral smear per pathology review. Likely reactive per hematology She needs to follow up with hematology  Acute metabolic encephalopathy with underlying dementia-secondary to the above-Continue Aricept, memantine, Seroquel -Minimize sedatives. She is pleasantly confused and cooperative in the last few days, now appear at baseline confusion.  Normocytic anemia, unclear etiology: Was on Eliquis prior to admission as well as  oral iron which are currently held.Anemia panel showed iron 30, ferritin 55, folate 14.8, vitamin B12 440-iron panel is not typical for iron deficiency per d/w hematology, Dr Tasia Catchings. LDH normal. Increased reticulocytosis can be seen acute GI bleed or hemolysis. T bilirubin is normal, less likely hemolysis.Hemoglobinnadir at 6.6, received one unit PRBC on 6/11. Patient is s/p EGD showing large hiatal hernia, normal otherwise. Patient not compliant with colonoscopy prep,GI recommend outpatient colonoscopy and signed off,Eliquis remains on hold. FOBT not done. CT abd /pelvis 07/23/19-- showed mitral valve calcification, moderate paraesophageal hernia , diverticulosis but no evidence of retroperitoneal hematoma or hepatosplenomegaly.   Check FOBT.  Atrial fibrillation with RVR--home med Coreg held. Cardiology recommended continuing diltiazem. Eliquis has been held due to concern for anemia/GI bleed. Pt started to have tachycardia on 6/13 pm lopressor added, follow final cardiology recommendations  Acute on chronic diastolic heart failure: patient presented with sepsis and was given IV fluids,currently significantly volume overloaded ,admission weight 160 pounds-> now 176 pounds. Cards changed oral Lasix back to IV Lasix over the weekend--will increase frequency to twice daily given 3+ leg edema. Continue to  monitor intake and output, daily weights. Home med Aldactone on hold.  Elevated troponin/demand ischemia-With history of CAD status post PCI in 2006 Relatively flat, although peaked to 64. No chest pain and inconsistent with ACS. Suspect demand ischemia.Continue statin  Essential hypertension-Continue Cardizem, lasix. Home meds coreg, spironolactone on hold.   Dyslipidemia-LDL 38, continue statin  Elevated BUN : sec to GIB vs dehydration/prerenal. BUN 50 on presentation, home med aldactone held, now normalized--monitor with diuresis.  Hyponatremia :Resolved.Sodium 130 on presentation-Suspect multifactorial including volume depletion, SSRI, poor oral intake.Patient was on IV fluids, now off and getting diuresis.Zoloft has been held.Continue Megace.Continue to monitor BMP  Hypomagnesemia/hypokalemia/hypophosphatemia-Resolved with repletion, continue to monitor and replace as needed  Deconditioning/generalized weakness-PT and OT recommending home healththen snf    DVT prophylaxis:  Eliquis on hold, SCDs Code Status:  Full code Family / Patient Communication: daughter Amy at bedside on 6/10, 6/11.  Will call and update today  Disposition Plan:   Status is: Inpatient  Remains inpatient appropriate because:Inpatient level of care appropriate due to severity of illness.Significantly volume overloaded,Needs IV diuresis.  Dispo: The patient is from: Home  Anticipated d/c is to: Home  Anticipated d/c date is: 2 days  Patient currently is not medically stable to d/c.    LOS: 15 days    Time spent: [place] [facility]     Guilford Shi, MD [facility] Pager in [place]  If 7PM-7AM, please contact night-coverage [email or web address] 08/06/2019, 9:09 AM

## 2019-08-06 NOTE — Care Management Important Message (Signed)
Important Message  Patient Details  Name: Kimberly Wang MRN: [id number] Date of Birth: Jun 23, 1930   Medicare Important Message Given:  Yes     [name] 08/06/2019, 12:18 PM

## 2019-08-07 ENCOUNTER — Inpatient Hospital Stay: Payer: Medicare PPO

## 2019-08-07 LAB — CBC WITH DIFFERENTIAL/PLATELET
Abs Immature Granulocytes: 0.1 10*3/uL — ABNORMAL HIGH (ref 0.00–0.07)
Basophils Absolute: 0 10*3/uL (ref 0.0–0.1)
Basophils Relative: 0 %
Eosinophils Absolute: 0 10*3/uL (ref 0.0–0.5)
Eosinophils Relative: 0 %
HCT: 26.2 % — ABNORMAL LOW (ref 36.0–46.0)
Hemoglobin: 8.2 g/dL — ABNORMAL LOW (ref 12.0–15.0)
Immature Granulocytes: 1 %
Lymphocytes Relative: 9 %
Lymphs Abs: 1.1 10*3/uL (ref 0.7–4.0)
MCH: 29.7 pg (ref 26.0–34.0)
MCHC: 31.3 g/dL (ref 30.0–36.0)
MCV: 94.9 fL (ref 80.0–100.0)
Monocytes Absolute: 0.7 10*3/uL (ref 0.1–1.0)
Monocytes Relative: 6 %
Neutro Abs: 10 10*3/uL — ABNORMAL HIGH (ref 1.7–7.7)
Neutrophils Relative %: 84 %
Platelets: 301 10*3/uL (ref 150–400)
RBC: 2.76 MIL/uL — ABNORMAL LOW (ref 3.87–5.11)
RDW: 18.7 % — ABNORMAL HIGH (ref 11.5–15.5)
WBC: 11.9 10*3/uL — ABNORMAL HIGH (ref 4.0–10.5)
nRBC: 0 % (ref 0.0–0.2)

## 2019-08-07 LAB — BASIC METABOLIC PANEL
Anion gap: 9 (ref 5–15)
BUN: 26 mg/dL — ABNORMAL HIGH (ref 8–23)
CO2: 24 mmol/L (ref 22–32)
Calcium: 8.1 mg/dL — ABNORMAL LOW (ref 8.9–10.3)
Chloride: 100 mmol/L (ref 98–111)
Creatinine, Ser: 0.97 mg/dL (ref 0.44–1.00)
GFR calc Af Amer: >60 mL/min (ref >60)
GFR calc non Af Amer: 53 mL/min — ABNORMAL LOW (ref >60)
Glucose, Bld: 90 mg/dL (ref 70–99)
Potassium: 4.2 mmol/L (ref 3.5–5.1)
Sodium: 133 mmol/L — ABNORMAL LOW (ref 135–145)

## 2019-08-07 LAB — MAGNESIUM: Magnesium: 2.3 mg/dL (ref 1.7–2.4)

## 2019-08-07 MED ORDER — METOLAZONE 2.5 MG PO TABS
2.5000 mg | ORAL_TABLET | Freq: Once | ORAL | Status: AC
  Administered 2019-08-07: 2.5 mg via ORAL
  Filled 2019-08-07: qty 1

## 2019-08-07 MED ORDER — FUROSEMIDE 40 MG PO TABS
40.0000 mg | ORAL_TABLET | Freq: Two times a day (BID) | ORAL | Status: DC
  Administered 2019-08-07 – 2019-08-09 (×5): 40 mg via ORAL
  Filled 2019-08-07 (×5): qty 1

## 2019-08-07 MED ORDER — CLONAZEPAM 0.25 MG PO TBDP
0.2500 mg | ORAL_TABLET | Freq: Two times a day (BID) | ORAL | Status: DC | PRN
  Administered 2019-08-07 – 2019-08-10 (×4): 0.25 mg via ORAL
  Filled 2019-08-07 (×4): qty 1

## 2019-08-07 NOTE — Progress Notes (Signed)
Physical Therapy Treatment Patient Details Name: Kimberly Wang MRN: [id number] DOB: [birth year] Today's Date: 08/07/2019    History of Present Illness Patient is an 86 year old female who presented to the emergency room with acute onset of altered mental status with generalized weakness and confusion and significant diminish p.o. intake, vomiting. Chest x-ray showed bilateral small pleural effusions with mild cardiomegaly. Found to have spesis/UTI. PMH includes Parkinson's dementia, CAD, HTN, MI.     PT Comments    Pt pleasant and motivated to participate during the session.  Pt's resting SpO2 on room air was 99% with HR 116 bpm.  Pt participated in graded therapeutic exercises with frequent rest breaks secondary to SOB with HR mostly in the 120s to 130s.  After two short ambulation sessions of grossly 3 feet the pt's HR increased to the low 140s with SpO2 remaining in the upper 90s.  Pt required frequent cues for safe sequencing with the RW during amb and for hand placement with transfers but did not require physical assistance during the session.  Pt will benefit from PT services in a SNF setting upon discharge to safely address deficits listed in patient problem list for decreased caregiver assistance and eventual return to PLOF.     Follow Up Recommendations  SNF     Equipment Recommendations  Rolling walker with 5" wheels    Recommendations for Other Services       Precautions / Restrictions Precautions Precautions: Fall Restrictions Weight Bearing Restrictions: No    Mobility  Bed Mobility               General bed mobility comments: NT, in recliner  Transfers Overall transfer level: Needs assistance Equipment used: Rolling walker (2 wheeled) Transfers: Sit to/from Stand Sit to Stand: Min guard         General transfer comment: Mod verbal cues for sequencing most notably for hand placement and proper use of the RW  Ambulation/Gait Ambulation/Gait assistance: Min  guard Gait Distance (Feet): 3 Feet x 2 Assistive device: Rolling walker (2 wheeled) Gait Pattern/deviations: Step-through pattern;Decreased step length - right;Decreased step length - left;Trunk flexed Gait velocity: decreased    General Gait Details: Mod verbal cues for safe use of the RW most notably during turns to the chair to stay closer to the [employer] Mobility    Modified Rankin (Stroke Patients Only)       Balance Overall balance assessment: Needs assistance   Sitting balance-Leahy Scale: Good     Standing balance support: Bilateral upper extremity supported Standing balance-Leahy Scale: Fair                              Cognition Arousal/Alertness: Awake/alert Behavior During Therapy: WFL for tasks assessed/performed Overall Cognitive Status: No family/caregiver present to determine baseline cognitive functioning                                 General Comments: Pt pleasantly confused, repeats questions and stories at times, perseverates on wanting to drink even after educating pt on fluid restrictions multiple times      Exercises Total Joint Exercises Ankle Circles/Pumps: AROM;Strengthening;Both;10 reps Quad Sets: Strengthening;Both;10 reps Gluteal Sets: Strengthening;Both;10 reps Towel Squeeze: Strengthening;Both;10 reps Hip ABduction/ADduction: Strengthening;Both;10 reps Long Arc Quad: Strengthening;Both;10 reps    General  Comments        Pertinent Vitals/Pain Pain Assessment: No/denies pain    Home Living                      Prior Function            PT Goals (current goals can now be found in the care plan section) Progress towards PT goals: Progressing toward goals    Frequency    Min 2X/week      PT Plan Current plan remains appropriate    Co-evaluation              AM-PAC PT "6 Clicks" Mobility   Outcome Measure  Help needed turning from your back to  your side while in a flat bed without using bedrails?: A Little Help needed moving from lying on your back to sitting on the side of a flat bed without using bedrails?: A Little Help needed moving to and from a bed to a chair (including a wheelchair)?: A Lot Help needed standing up from a chair using your arms (e.g., wheelchair or bedside chair)?: A Little Help needed to walk in hospital room?: A Lot Help needed climbing 3-5 steps with a railing? : A Lot 6 Click Score: 15    End of Session Equipment Utilized During Treatment: Gait belt Activity Tolerance: Treatment limited secondary to medical complications (Comment) (Pt limited by SOB and increased HR with minimal activity) Patient left: in chair;with call bell/phone within reach;with chair alarm set Nurse Communication: Mobility status;Other (comment) (SpO2 and HR response to activity) PT Visit Diagnosis: Unsteadiness on feet (R26.81);Muscle weakness (generalized) (M62.81);Difficulty in walking, not elsewhere classified (R26.2)     Time: 2025-4270 PT Time Calculation (min) (ACUTE ONLY): 23 min  Charges:  $Therapeutic Exercise: 8-22 mins $Therapeutic Activity: 8-22 mins                     D. Scott Talea Manges PT, DPT 08/07/19, 3:12 PM

## 2019-08-07 NOTE — Progress Notes (Signed)
Patient Name: Kimberly Wang Date of Encounter: 08/07/2019  Hospital Problem List     Active Problems:   Coagulation defect (HCC)   Sepsis due to gram-negative UTI (HCC)   Severe sepsis (HCC)   Dementia due to Parkinson's disease without behavioral disturbance (HCC)   Benign essential HTN   CAD (coronary artery disease)   Anemia   Sepsis due to urinary tract infection (HCC)   A-fib [facility])    Patient Profile     86 year old female with history of HFpEF followed at [facility], history of coronary artery disease status post PCI approximately 15 years ago, history atrial fibrillation rate controlled and anticoagulated with Eliquis. Rate control with carvedilol and diltiazem admitted with UTI sepsis. Had developed GI bleed.  Being evaluated by gastroenterology.  Further GI work-up has been hampered by patient noncompliance with bowel prep.  EGD showed large hiatal hernia but was normal otherwise.  Further GI work-up being deferred to outpatient due to noncompliance.  Subjective   Difficult historian.  Inpatient Medications    . atorvastatin  80 mg Oral Daily  . diltiazem  240 mg Oral Daily  . donepezil  10 mg Oral QHS  . feeding supplement (ENSURE ENLIVE)  237 mL Oral BID BM  . furosemide  40 mg Intravenous BID  . megestrol  400 mg Oral Daily  . melatonin  5 mg Oral Once  . memantine  10 mg Oral Daily  . metolazone  2.5 mg Oral Once  . metoprolol tartrate  12.5 mg Oral BID  . multivitamin with minerals  1 tablet Oral Daily  . pantoprazole (PROTONIX) IV  40 mg Intravenous Q12H  . potassium chloride  20 mEq Oral Daily  . sertraline  50 mg Oral QHS  . sodium chloride flush  3 mL Intravenous Q12H    Vital Signs    Vitals:   08/06/19 2006 08/07/19 0500 08/07/19 0612 08/07/19 0821  BP: (!) 145/99  126/71 118/73  Pulse: 97  95 (!) 46  Resp: 16  18 19   Temp: 98.9 F (37.2 C)  98.5 F (36.9 C) (!) 97.5 F (36.4 C)  TempSrc: Oral     SpO2: 100%  98% 98%   Weight:  80.7 kg    Height:        Intake/Output Summary (Last 24 hours) at 08/07/2019 0848 Last data filed at 08/07/2019 0612 Gross per 24 hour  Intake 660 ml  Output 450 ml  Net 210 ml   Filed Weights   08/05/19 0500 08/06/19 0651 08/07/19 0500  Weight: 76.3 kg 79.8 kg 80.7 kg    Physical Exam    GEN: Well nourished, well developed, in no acute distress.  HEENT: normal.  Neck: Supple, no JVD, carotid bruits, or masses. Cardiac: Irregularly irregular Respiratory:  Respirations regular and unlabored, clear to auscultation bilaterally. GI: Soft, nontender, nondistended, BS + x 4. MS: no deformity or atrophy. Skin: warm and dry, no rash. Neuro:  Strength and sensation are intact.   Labs    CBC Recent Labs    08/06/19 0639 08/07/19 0525  WBC 19.9* 11.9*  NEUTROABS 17.6* 10.0*  HGB 8.8* 8.2*  HCT 26.8* 26.2*  MCV 92.4 94.9  PLT 334 301   Basic Metabolic Panel Recent Labs    08/09/19 0457 08/05/19 0457 08/06/19 0639 08/07/19 0525 08/07/19 0811  NA 134*  --   --   --  133*  K 3.6  --   --   --  4.2  CL 102  --   --   --  100  CO2 22  --   --   --  24  GLUCOSE 102*  --   --   --  90  BUN 18  --   --   --  26*  CREATININE 0.95  --   --   --  0.97  CALCIUM 7.7*  --   --   --  8.1*  MG 2.3   < > 2.3 2.3  --    < > = values in this interval not displayed.   Liver Function Tests No results for input(s): AST, ALT, ALKPHOS, BILITOT, PROT, ALBUMIN in the last 72 hours. No results for input(s): LIPASE, AMYLASE in the last 72 hours. Cardiac Enzymes No results for input(s): CKTOTAL, CKMB, CKMBINDEX, TROPONINI in the last 72 hours. BNP No results for input(s): BNP in the last 72 hours. D-Dimer No results for input(s): DDIMER in the last 72 hours. Hemoglobin A1C No results for input(s): HGBA1C in the last 72 hours. Fasting Lipid Panel No results for input(s): CHOL, HDL, LDLCALC, TRIG, CHOLHDL, LDLDIRECT in the last 72 hours. Thyroid Function Tests No results  for input(s): TSH, T4TOTAL, T3FREE, THYROIDAB in the last 72 hours.  Invalid input(s): FREET3  Telemetry    Atrial fibrillation with controlled ventricular response  ECG      Radiology    CT ABDOMEN PELVIS WO CONTRAST  Result Date: 07/22/2019 CLINICAL DATA:  Sepsis EXAM: CT ABDOMEN AND PELVIS WITHOUT CONTRAST TECHNIQUE: Multidetector CT imaging of the abdomen and pelvis was performed following the standard protocol without IV contrast. COMPARISON:  None. FINDINGS: Lower chest: There is mild cardiomegaly. Mitral valve calcifications. A moderate paraesophageal hernia containing contrast is seen. Hepatobiliary: Although limited due to the lack of intravenous contrast, normal in appearance without gross focal abnormality. The patient is status post cholecystectomy. No biliary ductal dilation. Pancreas:  Unremarkable.  No surrounding inflammatory changes. Spleen: Normal in size. Although limited due to the lack of intravenous contrast, normal in appearance. Adrenals/Urinary Tract: Both adrenal glands appear normal. The kidneys and collecting system appear normal without evidence of urinary tract calculus or hydronephrosis. Bladder is unremarkable. Stomach/Bowel: The stomach, small bowel, are normal in appearance. There is scattered colonic diverticula without diverticulitis. Vascular/Lymphatic: There are no enlarged abdominal or pelvic lymph nodes. Scattered aortic atherosclerotic calcifications are seen without aneurysmal dilatation. Reproductive: The uterus and adnexa are unremarkable. Other: A small fat and transverse colon containing umbilical hernia seen within the mid abdomen. Musculoskeletal: No acute or significant osseous findings. IMPRESSION: Moderate paraesophageal hernia. Diverticulosis without diverticulitis. No other acute intra-abdominal or pelvic pathology to explain the patient's symptoms. Aortic Atherosclerosis (ICD10-I70.0). Electronically Signed   By: Jonna Clark M.D.   On: 07/22/2019  01:40   DG Chest 1 View  Result Date: 07/27/2019 CLINICAL DATA:  Shortness of breath EXAM: CHEST  1 VIEW COMPARISON:  July 26, 2019 FINDINGS: There is unchanged mild cardiomegaly. There is aortic knob and descending intrathoracic aortic calcifications. There is slight interval improvement in the prominence of the central pulmonary vasculature. No acute osseous abnormality. IMPRESSION: Slight interval improvement in the pulmonary vascular congestion. Electronically Signed   By: Jonna Clark M.D.   On: 07/27/2019 06:04   DG Chest 1 View  Result Date: 07/26/2019 CLINICAL DATA:  Shortness of breath. EXAM: CHEST  1 VIEW COMPARISON:  07/23/2019. FINDINGS: Mediastinum hilar structures normal. Cardiomegaly. Mild bilateral interstitial prominence and small pleural effusions. CHF could  present in this fashion. Sliding hiatal hernia cannot be excluded. IMPRESSION: Cardiomegaly. Mild bilateral interstitial prominence and small pleural effusions. CHF could present in this fashion. Electronically Signed   By: Marcello Moores  Register   On: 07/26/2019 07:52   DG Chest 1 View  Result Date: 07/23/2019 CLINICAL DATA:  Dyspnea EXAM: CHEST  1 VIEW COMPARISON:  Chest radiograph from one day prior. FINDINGS: Stable cardiomediastinal silhouette with mild cardiomegaly. No pneumothorax. Stable small left pleural effusion. No right pleural effusion. No overt pulmonary edema. Streaky bibasilar lung opacities are similar IMPRESSION: Stable small left pleural effusion. Stable streaky bibasilar lung opacities, favor atelectasis or scarring. Stable mild cardiomegaly without overt pulmonary edema. Electronically Signed   By: Ilona Sorrel M.D.   On: 07/23/2019 04:50   DG Chest 1 View  Result Date: 07/21/2019 CLINICAL DATA:  Sepsis. EXAM: CHEST  1 VIEW COMPARISON:  None. FINDINGS: There are small bilateral pleural effusions. There is a left perihilar density of unknown clinical significance. The heart size is mildly enlarged. There are dense  aortic calcifications. There are few streaky airspace opacities at the lung bases favored to represent areas of atelectasis. There is no pneumothorax. IMPRESSION: 1. Small bilateral pleural effusions. 2. Mild cardiomegaly. 3. Slightly prominent left perihilar region may be secondary to a dilated pulmonary artery or lymphadenopathy. A follow-up two-view chest x-ray is recommended in 4-6 weeks. Electronically Signed   By: Constance Holster M.D.   On: 07/21/2019 22:34   DG Chest Port 1 View  Result Date: 08/03/2019 CLINICAL DATA:  Cough EXAM: PORTABLE CHEST 1 VIEW COMPARISON:  07/27/2019 FINDINGS: Cardiac shadow remains enlarged. Aortic calcifications are again seen. Small bilateral pleural effusions are noted stable from the prior exam. No focal infiltrate is noted. No acute bony abnormality is seen. IMPRESSION: Small effusions bilaterally.  No acute abnormality noted. Electronically Signed   By: Inez Catalina M.D.   On: 08/03/2019 17:17   DG Chest Port 1 View  Result Date: 07/22/2019 CLINICAL DATA:  Shortness of breath. EXAM: PORTABLE CHEST 1 VIEW COMPARISON:  Jul 21, 2019 FINDINGS: Cardiomegaly. Skin fold over the lateral right chest. The hila and mediastinum are normal. No pneumothorax. No nodules or masses. No focal infiltrates. IMPRESSION: No active disease. Electronically Signed   By: Dorise Bullion III M.D   On: 07/22/2019 15:12   ECHOCARDIOGRAM COMPLETE  Result Date: 07/27/2019    ECHOCARDIOGRAM REPORT   Patient Name:   ELOIS AVERITT Date of Exam: 07/26/2019 Medical Rec #:  [id number]   Height:       64.0 in Accession #:    [id number]  Weight:       164.2 lb Date of Birth:  [birth year]    BSA:          1.799 m Patient Age:    70 years    BP:           117/65 mmHg Patient Gender: F           HR:           124 bpm. Exam Location:  [facility] Procedure: 2D Echo, Cardiac Doppler and Color Doppler Indications:     Dyspnea 786.09 / R06.00  History:         Patient has no prior history of Echocardiogram examinations.                   CAD; Risk Factors:Hypertension. MI.  Sonographer:     Alyse Low Roar Referring Phys:  [id number] [facility]  Diagnosing Phys: Lorine Bears MD IMPRESSIONS  1. Left ventricular ejection fraction, by estimation, is 40 to 45%. The left ventricle has mildly decreased function. The left ventricle demonstrates regional wall motion abnormalities with possible anterior/anteroseptal hypokinesis. There is mild left ventricular hypertrophy. Left ventricular diastolic parameters are indeterminate.  2. Right ventricular systolic function is normal. The right ventricular size is normal. There is mildly elevated pulmonary artery systolic pressure.  3. Left atrial size was severely dilated.  4. Right atrial size was moderately dilated.  5. The mitral valve is abnormal. Moderate mitral valve regurgitation. No evidence of mitral stenosis.  6. The aortic valve is abnormal. Aortic valve regurgitation is moderate. Mild aortic valve sclerosis is present, with no evidence of aortic valve stenosis. FINDINGS  Left Ventricle: Left ventricular ejection fraction, by estimation, is 40 to 45%. The left ventricle has mildly decreased function. The left ventricle demonstrates regional wall motion abnormalities. The left ventricular internal cavity size was normal in size. There is mild left ventricular hypertrophy. Left ventricular diastolic parameters are indeterminate. Right Ventricle: The right ventricular size is normal. No increase in right ventricular wall thickness. Right ventricular systolic function is normal. There is mildly elevated pulmonary artery systolic pressure. The tricuspid regurgitant velocity is 2.69  m/s, and with an assumed right atrial pressure of 10 mmHg, the estimated right ventricular systolic pressure is 38.9 mmHg. Left Atrium: Left atrial size was severely dilated. Right Atrium: Right atrial size was moderately dilated. Pericardium: There is no evidence of pericardial effusion. Mitral Valve: The  mitral valve is abnormal. There is moderate thickening of the mitral valve leaflet(s). There is mild calcification of the mitral valve leaflet(s). Normal mobility of the mitral valve leaflets. Moderate mitral annular calcification. Moderate mitral valve regurgitation. No evidence of mitral valve stenosis. Tricuspid Valve: The tricuspid valve is normal in structure. Tricuspid valve regurgitation is not demonstrated. No evidence of tricuspid stenosis. Aortic Valve: The aortic valve is abnormal. Aortic valve regurgitation is moderate. Mild aortic valve sclerosis is present, with no evidence of aortic valve stenosis. Aortic valve mean gradient measures 7.5 mmHg. Aortic valve peak gradient measures 14.1 mmHg. Aortic valve area, by VTI measures 1.73 cm. Pulmonic Valve: The pulmonic valve was normal in structure. Pulmonic valve regurgitation is not visualized. No evidence of pulmonic stenosis. Aorta: The aortic root is normal in size and structure. Venous: The inferior vena cava was not well visualized. IAS/Shunts: No atrial level shunt detected by color flow Doppler.  LEFT VENTRICLE PLAX 2D LVIDd:         4.47 cm  Diastology LVIDs:         3.12 cm  LV e' lateral:   6.64 cm/s LV PW:         1.14 cm  LV E/e' lateral: 23.8 LV IVS:        1.06 cm  LV e' medial:    8.81 cm/s LVOT diam:     1.70 cm  LV E/e' medial:  17.9 LV SV:         52 LV SV Index:   29 LVOT Area:     2.27 cm  RIGHT VENTRICLE RV Mid diam:    2.29 cm RV S prime:     12.70 cm/s TAPSE (M-mode): 1.6 cm LEFT ATRIUM             Index       RIGHT ATRIUM           Index LA diam:  4.20 cm 2.33 cm/m  RA Area:     19.00 cm LA Vol (A2C):   92.9 ml 51.64 ml/m RA Volume:   44.60 ml  24.79 ml/m LA Vol (A4C):   82.8 ml 46.02 ml/m LA Biplane Vol: 91.4 ml 50.80 ml/m  AORTIC VALVE                    PULMONIC VALVE AV Area (Vmax):    1.54 cm     PV Vmax:        1.10 m/s AV Area (Vmean):   1.56 cm     PV Peak grad:   4.8 mmHg AV Area (VTI):     1.73 cm     RVOT  Peak grad: 2 mmHg AV Vmax:           187.50 cm/s AV Vmean:          123.000 cm/s AV VTI:            0.301 m AV Peak Grad:      14.1 mmHg AV Mean Grad:      7.5 mmHg LVOT Vmax:         127.00 cm/s LVOT Vmean:        84.500 cm/s LVOT VTI:          0.229 m LVOT/AV VTI ratio: 0.76  AORTA Ao Root diam: 2.20 cm MITRAL VALVE                TRICUSPID VALVE MV Area (PHT): 3.16 cm     TR Peak grad:   28.9 mmHg MV Decel Time: 240 msec     TR Vmax:        269.00 cm/s MV E velocity: 158.00 cm/s                             SHUNTS                             Systemic VTI:  0.23 m                             Systemic Diam: 1.70 cm Lorine Bears MD Electronically signed by Lorine Bears MD Signature Date/Time: 07/27/2019/8:13:34 AM    Final     Assessment & Plan    86 y.o.femalewith history ofheart failure with preserved ejection fraction, chronic atrial fibrillation, coronary artery disease, dementia who is followed by [facility] who was admitted with weakness and acute onset of confusion worse over her baseline.She had a cardiac MRI in 2011 showing normal RV and LV function. There was a left atrial thrombus with no evidence of myocardial infarction. She had a non-STEMI in 2006 with cardiac cath revealing distal LAD disease not amenable to PCI. She had a PCI of the RCA with a Taxus stent. This was done in 2006 earlier. She has been on rate control with diltiazem for her A. fib. She has been on Eliquis for anticoagulation. She was diagnosed with both mixed Alzheimer's and vascular dementia in 2015. Her most recent outpatient regimen included apixaban 5 mg twice daily, atorvastatin 80 mg daily, carvedilol 6.25 mg twice daily, diltiazem 240 mg daily, furosemide 80 mg twice daily, spironolactone 25 mg daily along with potassium chloride to 20 mEq daily.   1. HFpEF-EF has been well-preserved by echo  [facility] with normal RV and LV function. Chest x-ray did not show  pulmonary edema on admission. Fluid status appears to be over hydration. She does not appear acutely in congestive heart failure at present. Heart rate is controlled and she is oxygenating well. Would continue to carefully diurese following renal function and symptoms. BNP was mildly elevated.Will repeat today.Echo done during this admission was read as showing an EF of 40 to 45% with moderate MR and AI.   Currently on IV Lasix.  Will convert to 40 mg p.o. twice daily.  Low-sodium diet suggested to the patient.   2. Atrial fibrillation-rate appears well controlled at present. We will continue with diltiazem 240 mg daily.  We will also continue with metoprolol tartrate 12.5 mg twice daily.  We will continue to hold Eliquis due to GI bleed risk.. Continue to hold anticolagulation due to anemia and possible active bleeding.  3. Sepsis-continue withcurrent rx. Appears clinically improved.  4. Dementia-has mixed vascular and Alzheimer's dementia.  Seems to be at her baseline.    5. Coronary artery disease-status post PCI. This was done in 2006. No clinical evidence of ischemia. Patient denies chest pain at present. Not a candidate for invasive evaluation at present. Cardiac markers are unremarkable.  6. Anemia-has stabilized hemoglobin.  GI work-up is hampered by failure to be compliant with prep. Hemoglobin 8.2 today was 8.8 yesterday 7.43 days ago.   Signed, Darlin PriestlyKenneth A. Atom Solivan MD 08/07/2019, 8:48 AM  Pager: [phone]

## 2019-08-07 NOTE — Progress Notes (Signed)
Patient put on fluid restriction and ensure orders were discontinued per MD request.

## 2019-08-07 NOTE — Progress Notes (Signed)
Nutrition Follow-Up Note   DOCUMENTATION CODES:   Not applicable  INTERVENTION:   Ensure Enlive discontinued by MD  MVI daily   NUTRITION DIAGNOSIS:   Inadequate oral intake related to altered GI function (anemia of unknown etiology) as evidenced by  (NPO/CLD x 10 days). -resolved   GOAL:   Patient will meet greater than or equal to 90% of their needs -progressing   MONITOR:   PO intake, Labs, Weight trends, Skin, I & O's  ASSESSMENT:   RD working remotely.  86 year old female admitted for severe sepsis secondary to E coli UTI after presenting with acute onset of AMS, generalized weakness, diminished po intake, episode of emesis, and low grade fever. Past medical history significant of dementia, CAD, and HTN.   Pt advanced to a regular diet 6/11. Pt with improved appetite and oral intake in hospital; pt eating 50-100% of meals. Ensure discontinued by MD in setting of volume overload. Per chart, pt up ~18lbs since admit. Pt placed on a 1L fluid restriction. Pt continues to be on megace.   Medications reviewed and include: lasix, melatonin, megace, MVI, protonix, KCl  Labs reviewed: Na 133(L), BUN 26(H) Wbc 11.9(H), Hgb 8.2(L), Hct 26.2(L)  Diet Order:   Diet Order            Diet Heart Room service appropriate? Yes; Fluid consistency: Thin; Fluid restriction: Other (see comments)  Diet effective now                EDUCATION NEEDS:   No education needs have been identified at this time  Skin:  Skin Assessment: Reviewed RN Assessment  Last BM:  6/12  Height:   Ht Readings from Last 1 Encounters:  08/03/19 5\' 4"  (1.626 m)    Weight:   Wt Readings from Last 1 Encounters:  08/07/19 80.7 kg     BMI:  Body mass index is 30.55 kg/m.  Estimated Nutritional Needs:   Kcal:  1700-1900  Protein:  85-95  Fluid:  08/09/19 per MD  MS, RD, LDN Please refer to [facility] for RD and/or RD on-call/weekend/after hours pager

## 2019-08-07 NOTE — Progress Notes (Signed)
Patient very agitated this afternoon and demanding fluids/food.  She has been educated repeatedly that she cannot have anything at this time.  She continues to be impulsive requiring nursing to be in there almost constantly.  Spoke to MD who placed orders for clonopin.   Clonopin given.   Vickey Sages, RN to take over for the remainder of the shift.  She will continue to follow up.

## 2019-08-07 NOTE — Progress Notes (Addendum)
PROGRESS NOTE    Kimberly Wang  WPY:[id number]  DOB: 05/21/1930  PCP: Valera Castle, MD Admit date:07/22/2019 86 y.o.F with h/o CAD, HTN and dementia, presented to the emergency room with acute onset of AMS, generalized weakness, concern for poor oral intake and vomiting 1 associated with low grade fever . ED-Course: Temp 100.2 and heart rate 101 with otherwise normal vital signs. CMP revealed hyponatremia and hypokalemia as well as hypochloremia with blood glucose of 223, BUN of 18 and creatinine 1.14, albumin of 3.4. Lactic acid was 3.8 and later 3.7. High-sensitivity troponin I was 60. CBC showed leukocytosis of 25.1 with neutrophilia. Urinalysis showed 6-10 WBCs with positive nitrite and trace leukocytes. Blood cultures were drawn and urine culture was sent. Abdominal and pelvic CT scan revealed paraesophageal hernia with no acute findings. Chest x-ray showed bilateral small pleural effusions with mild cardiomegaly. Patient received IV fluids, IV cefepime and a gram of IV vancomycin. Hospital course: Patient admitted to [facility] for further evaluation and management of UTI with sepsis. HC complicated by worsening anemia requiring PRBC transfusion and EGD on 6/8, volume overload requiring IV diuresis and A fib with RVR. Prompting cardiology evaluation . Also evaluated by Hematology for leucocytosis/anemia  Subjective:  Patient sitting in bedside chair.  Still has significant leg swellings and appears somewhat dyspneic on talking full sentences. Weight further up at 178 pounds.  Seems to be constantly asking for drinks coffee/tea/water) On room air and saturating well.  Objective: Vitals:   08/06/19 1606 08/06/19 2006 08/07/19 0500 08/07/19 0612  BP: 106/63 (!) 145/99  126/71  Pulse: 71 97  95  Resp: 17 16  18   Temp: 98.1 F (36.7 C) 98.9 F (37.2 C)  98.5 F (36.9 C)  TempSrc: Oral Oral    SpO2: 100% 100%  98%  Weight:   80.7 kg   Height:        Intake/Output Summary (Last  24 hours) at 08/07/2019 0804 Last data filed at 08/07/2019 0612 Gross per 24 hour  Intake 660 ml  Output 450 ml  Net 210 ml   Filed Weights   08/05/19 0500 08/06/19 0651 08/07/19 0500  Weight: 76.3 kg 79.8 kg 80.7 kg    Physical Examination:  General exam: Appears comfortable, on room air Respiratory system: Clear to auscultation but decreased breath sounds at bases. Respiratory effort mildly increased on talking full sentences.  Cardiovascular system: S1 & S2 heard, RRR. No JVD, murmurs, rubs, gallops or clicks.  3+ pedal edema. Gastrointestinal system: Abdomen is obese, nondistended, soft and nontender. Normal bowel sounds  Central nervous system: Alert and oriented. No new focal neurological deficits. Extremities: No contractures, but has 3+ pitting edema bilaterally Skin: No rashes, lesions or ulcers Psychiatry: Judgement and insight appear normal. Mood & affect upbeat.   Data Reviewed: I have personally reviewed following labs and imaging studies  CBC: Recent Labs  Lab 08/02/19 0549 08/03/19 0637 08/04/19 0634 08/06/19 0639 08/07/19 0525  WBC 14.4* 17.7* 16.0* 19.9* 11.9*  NEUTROABS 11.7* 14.9* 13.8* 17.6* 10.0*  HGB 7.0* 6.6* 7.4* 8.8* 8.2*  HCT 22.6* 20.7* 24.1* 26.8* 26.2*  MCV 97.0 94.5 94.1 92.4 94.9  PLT 313 390 311 334 053   Basic Metabolic Panel: Recent Labs  Lab 08/01/19 0643 08/01/19 0643 08/02/19 0549 08/02/19 0549 08/03/19 0637 08/04/19 0634 08/05/19 0457 08/06/19 0639 08/07/19 0525  NA 138  --  138  --  134* 137 134*  --   --   K 4.8  --  4.6  --  5.0 4.5 3.6  --   --   CL 107  --  108  --  106 105 102  --   --   CO2 24  --  22  --  21* 24 22  --   --   GLUCOSE 103*  --  90  --  103* 99 102*  --   --   BUN 16  --  21  --  17 18 18   --   --   CREATININE 0.87  --  0.84  --  1.00 1.06* 0.95  --   --   CALCIUM 8.4*  --  8.0*  --  7.8* 8.1* 7.7*  --   --   MG 2.4   < > 2.6*   < > 2.9* 2.6* 2.3 2.3 2.3  PHOS 3.6  --   --   --   --   --   --   --    --    < > = values in this interval not displayed.   GFR: Estimated Creatinine Clearance: 42.9 mL/min (by C-G formula based on SCr of 0.95 mg/dL). Liver Function Tests: No results for input(s): AST, ALT, ALKPHOS, BILITOT, PROT, ALBUMIN in the last 168 hours. No results for input(s): LIPASE, AMYLASE in the last 168 hours. No results for input(s): AMMONIA in the last 168 hours. Coagulation Profile: No results for input(s): INR, PROTIME in the last 168 hours. Cardiac Enzymes: No results for input(s): CKTOTAL, CKMB, CKMBINDEX, TROPONINI in the last 168 hours. BNP (last 3 results) No results for input(s): PROBNP in the last 8760 hours. HbA1C: No results for input(s): HGBA1C in the last 72 hours. CBG: Recent Labs  Lab 07/31/19 1922 08/05/19 2150  GLUCAP 135* 100*   Lipid Profile: No results for input(s): CHOL, HDL, LDLCALC, TRIG, CHOLHDL, LDLDIRECT in the last 72 hours. Thyroid Function Tests: No results for input(s): TSH, T4TOTAL, FREET4, T3FREE, THYROIDAB in the last 72 hours. Anemia Panel: Recent Labs    08/06/19 0639  RETICCTPCT 7.8*   Sepsis Labs: No results for input(s): PROCALCITON, LATICACIDVEN in the last 168 hours.  No results found for this or any previous visit (from the past 240 hour(s)).    Radiology Studies: No results found.      Scheduled Meds: . atorvastatin  80 mg Oral Daily  . diltiazem  240 mg Oral Daily  . donepezil  10 mg Oral QHS  . feeding supplement (ENSURE ENLIVE)  237 mL Oral BID BM  . furosemide  40 mg Intravenous BID  . megestrol  400 mg Oral Daily  . melatonin  5 mg Oral Once  . memantine  10 mg Oral Daily  . metolazone  2.5 mg Oral Once  . metoprolol tartrate  12.5 mg Oral BID  . multivitamin with minerals  1 tablet Oral Daily  . pantoprazole (PROTONIX) IV  40 mg Intravenous Q12H  . potassium chloride  20 mEq Oral Daily  . sertraline  50 mg Oral QHS  . sodium chloride flush  3 mL Intravenous Q12H   Continuous Infusions:    Assessment/Plan:  Severe sepsis secondary to E. coli urinary tract infection: Present on admission. Patient with leukocytosis, tachycardia, tachypnea, elevated lactic acid and AMS on admission.Patient has completed antibiotic course. Afebrile now although has persistent/fluctuating leucocytosis. No blasts or fragmented cells on peripheral smear per pathology review. Likely reactive per hematology-down to 11.9k today. BM biopsy deferred. She needs to follow up with hematology  Acute metabolic encephalopathy with underlying dementia-secondary to the above-Continue Aricept, memantine, Seroquel -Minimize sedatives. She is pleasantly confused and cooperative in the last few days, now appear at baseline confusion.  Normocytic anemia, unclear etiology: Was on Eliquis prior to admission as well as oral iron which are currently held.Anemia panel showed iron 30, ferritin 55, folate 14.8, vitamin B12 440-iron panel is not typical for iron deficiency per d/w hematology, Dr Cathie Hoops. LDH normal. Increased reticulocytosis can be seen acute GI bleed or hemolysis. T bilirubin is normal, less likely hemolysis.Hemoglobinnadir at 6.6, received one unit PRBC on 6/11. Patient is s/p EGD showing large hiatal hernia, normal otherwise. Patient not compliant with colonoscopy prep,GI recommend outpatient colonoscopy and signed off,Eliquis remains on hold. FOBT not done. CT abd /pelvis 07/23/19-- showed mitral valve calcification, moderate paraesophageal hernia , diverticulosis but no evidence of retroperitoneal hematoma or hepatosplenomegaly.   Check FOBT.  Atrial fibrillation with RVR--home med Coreg held. Cardiology recommended continuing diltiazem and to hold Eliquis in concern for anemia of unclear source/etiology. Pt started to have tachycardia on 6/13 pm lopressor added, follow final cardiology recommendations  Acute on chronic diastolic heart failure: patient presented with sepsis and was given IV fluids,currently  significantly volume overloaded ,admission weight 160 pounds-> now 176 pounds. Cards changed oral Lasix back to IV Lasix over the weekend--will increase frequency to twice daily given 3+ leg edema. Continue to monitor intake and output, daily weights--weight further up today to 178 pounds -add zaroxolyn x 1 dose and requested staff to place her on ted hose with legs elevated.. Home med Aldactone on hold. Pt to obtain walking desat studies. Will obtain CXR to r/o pleural effusions/CHF. Requested to place on fluid restriction.  Elevated troponin/demand ischemia-With history of CAD status post PCI in 2006 Relatively flat, although peaked to 64. No chest pain and inconsistent with ACS. Suspect demand ischemia.Continue statin  Essential hypertension-Continue Cardizem, lasix. Home meds coreg, spironolactone on hold.   Dyslipidemia-LDL 38, continue statin  Elevated BUN : sec to GIB vs dehydration/prerenal. BUN 50 on presentation, home med aldactone held, now normalized--monitor with diuresis.  Hyponatremia :Resolved.Sodium 130 on presentation-Suspect multifactorial including volume depletion, SSRI, poor oral intake.Patient was on IV fluids, now off and getting diuresis.Zoloft has been held.Continue Megace.Continue to monitor BMP  Hypomagnesemia/hypokalemia/hypophosphatemia-Resolved with repletion, continue to monitor and replace as needed  Deconditioning/generalized weakness-PT and OT recommending home healththen snf    DVT prophylaxis:  Eliquis on hold, SCDs Code Status:  Full code Family / Patient Communication: daughter Amy at bedside on 6/10, 6/11.  Will call and update today  Disposition Plan:   Status is: Inpatient  Remains inpatient appropriate because:Inpatient level of care appropriate due to severity of illness.Significantly volume overloaded,Needs IV diuresis.  Dispo: The patient is from: Home  Anticipated d/c is to: Home (patient preference)    Anticipated d/c date is: 2 days  Patient currently is not medically stable to d/c.    LOS: 16 days    Time spent: 35 MINS     Alessandra Bevels, MD [facility] Pager in [place]  If 7PM-7AM, please contact night-coverage [email or web address] 08/07/2019, 8:04 AM

## 2019-08-08 LAB — COMP PANEL: LEUKEMIA/LYMPHOMA: Immunophenotypic Profile: 0

## 2019-08-08 LAB — CBC WITH DIFFERENTIAL/PLATELET
Abs Immature Granulocytes: 0.06 10*3/uL (ref 0.00–0.07)
Basophils Absolute: 0 10*3/uL (ref 0.0–0.1)
Basophils Relative: 0 %
Eosinophils Absolute: 0 10*3/uL (ref 0.0–0.5)
Eosinophils Relative: 0 %
HCT: 27.7 % — ABNORMAL LOW (ref 36.0–46.0)
Hemoglobin: 8.8 g/dL — ABNORMAL LOW (ref 12.0–15.0)
Immature Granulocytes: 1 %
Lymphocytes Relative: 15 %
Lymphs Abs: 1.3 10*3/uL (ref 0.7–4.0)
MCH: 29.5 pg (ref 26.0–34.0)
MCHC: 31.8 g/dL (ref 30.0–36.0)
MCV: 93 fL (ref 80.0–100.0)
Monocytes Absolute: 0.7 10*3/uL (ref 0.1–1.0)
Monocytes Relative: 8 %
Neutro Abs: 6.8 10*3/uL (ref 1.7–7.7)
Neutrophils Relative %: 76 %
Platelets: 312 10*3/uL (ref 150–400)
RBC: 2.98 MIL/uL — ABNORMAL LOW (ref 3.87–5.11)
RDW: 18.1 % — ABNORMAL HIGH (ref 11.5–15.5)
Smear Review: NORMAL
WBC: 8.9 10*3/uL (ref 4.0–10.5)
nRBC: 0 % (ref 0.0–0.2)

## 2019-08-08 LAB — BASIC METABOLIC PANEL
Anion gap: 9 (ref 5–15)
BUN: 24 mg/dL — ABNORMAL HIGH (ref 8–23)
CO2: 29 mmol/L (ref 22–32)
Calcium: 8.2 mg/dL — ABNORMAL LOW (ref 8.9–10.3)
Chloride: 99 mmol/L (ref 98–111)
Creatinine, Ser: 0.94 mg/dL (ref 0.44–1.00)
GFR calc Af Amer: >60 mL/min (ref >60)
GFR calc non Af Amer: 55 mL/min — ABNORMAL LOW (ref >60)
Glucose, Bld: 97 mg/dL (ref 70–99)
Potassium: 3 mmol/L — ABNORMAL LOW (ref 3.5–5.1)
Sodium: 137 mmol/L (ref 135–145)

## 2019-08-08 LAB — POTASSIUM: Potassium: 3.1 mmol/L — ABNORMAL LOW (ref 3.5–5.1)

## 2019-08-08 LAB — SARS CORONAVIRUS 2 BY RT PCR (HOSPITAL ORDER, PERFORMED IN ~~LOC~~ HOSPITAL LAB): SARS Coronavirus 2: NEGATIVE

## 2019-08-08 LAB — MAGNESIUM: Magnesium: 2 mg/dL (ref 1.7–2.4)

## 2019-08-08 MED ORDER — METOLAZONE 2.5 MG PO TABS
2.5000 mg | ORAL_TABLET | Freq: Once | ORAL | Status: AC
  Administered 2019-08-08: 2.5 mg via ORAL
  Filled 2019-08-08: qty 1

## 2019-08-08 MED ORDER — POTASSIUM CHLORIDE CRYS ER 20 MEQ PO TBCR
40.0000 meq | EXTENDED_RELEASE_TABLET | Freq: Once | ORAL | Status: AC
  Administered 2019-08-08: 40 meq via ORAL
  Filled 2019-08-08: qty 2

## 2019-08-08 NOTE — Progress Notes (Signed)
PROGRESS NOTE    Kimberly Wang  DPO:[id number]  DOB: [birth year]  PCP: [name], MD Admit date:07/22/2019 86 y.o.F with h/o CAD, HTN and dementia, presented to the emergency room with acute onset of AMS, generalized weakness, concern for poor oral intake and vomiting 1 associated with low grade fever . ED-Course: Temp 100.2 and heart rate 101 with otherwise normal vital signs. CMP revealed hyponatremia and hypokalemia as well as hypochloremia with blood glucose of 223, BUN of 18 and creatinine 1.14, albumin of 3.4. Lactic acid was 3.8 and later 3.7. High-sensitivity troponin I was 60. CBC showed leukocytosis of 25.1 with neutrophilia. Urinalysis showed 6-10 WBCs with positive nitrite and trace leukocytes. Blood cultures were drawn and urine culture was sent. Abdominal and pelvic CT scan revealed paraesophageal hernia with no acute findings. Chest x-ray showed bilateral small pleural effusions with mild cardiomegaly. Patient received IV fluids, IV cefepime and a gram of IV vancomycin. Hospital course: Patient admitted to [facility] for further evaluation and management of UTI with sepsis. HC complicated by worsening anemia requiring PRBC transfusion and EGD on 6/8, volume overload requiring IV diuresis and A fib with RVR. Prompting cardiology evaluation . Also evaluated by Hematology for leucocytosis/anemia  Subjective:  Patient sitting in bedside chair.  Her weight finally appears to be downtrending and leg swellings look somewhat improved.  She states she is watching her oral fluid intake.  She is now agreeable to going to SNF per PT recommendations.  Objective: Vitals:   08/08/19 0816 08/08/19 1003 08/08/19 1221 08/08/19 1625  BP: (!) 102/59 113/82 105/72 116/66  Pulse: 95 100 (!) 110 85  Resp: 20  19 16   Temp: 98.3 F (36.8 C) 97.7 F (36.5 C) 98.3 F (36.8 C) 98.1 F (36.7 C)  TempSrc:  Oral  Oral  SpO2: 97% 100% 99% 100%  Weight:      Height:        Intake/Output  Summary (Last 24 hours) at 08/08/2019 1643 Last data filed at 08/08/2019 1505 Gross per 24 hour  Intake 60 ml  Output 2250 ml  Net -2190 ml   Filed Weights   08/07/19 0500 08/08/19 0020 08/08/19 0203  Weight: 80.7 kg 77.2 kg 77.2 kg    Physical Examination:  General exam: Appears comfortable, on room air Respiratory system: Clear to auscultation but decreased breath sounds at bases. Respiratory effort mildly increased on talking full sentences.  Cardiovascular system: S1 & S2 heard, RRR. No JVD, murmurs, rubs, gallops or clicks.  3+ pedal edema. Gastrointestinal system: Abdomen is obese, nondistended, soft and nontender. Normal bowel sounds  Central nervous system: Alert and oriented. No new focal neurological deficits. Extremities: No contractures, improving leg swellings with 2+ pitting edema bilaterally Skin: No rashes, lesions or ulcers Psychiatry: Judgement and insight appear normal. Mood & affect upbeat.   Data Reviewed: I have personally reviewed following labs and imaging studies  CBC: Recent Labs  Lab 08/03/19 0637 08/04/19 0634 08/06/19 0639 08/07/19 0525 08/08/19 0446  WBC 17.7* 16.0* 19.9* 11.9* 8.9  NEUTROABS 14.9* 13.8* 17.6* 10.0* 6.8  HGB 6.6* 7.4* 8.8* 8.2* 8.8*  HCT 20.7* 24.1* 26.8* 26.2* 27.7*  MCV 94.5 94.1 92.4 94.9 93.0  PLT 390 311 334 301 312   Basic Metabolic Panel: Recent Labs  Lab 08/03/19 0637 08/03/19 0637 08/04/19 0634 08/05/19 0457 08/06/19 0639 08/07/19 0525 08/07/19 0811 08/08/19 0446  NA 134*  --  137 134*  --   --  133* 137  K 5.0  --  4.5 3.6  --   --  4.2 3.0*  CL 106  --  105 102  --   --  100 99  CO2 21*  --  24 22  --   --  24 29  GLUCOSE 103*  --  99 102*  --   --  90 97  BUN 17  --  18 18  --   --  26* 24*  CREATININE 1.00  --  1.06* 0.95  --   --  0.97 0.94  CALCIUM 7.8*  --  8.1* 7.7*  --   --  8.1* 8.2*  MG 2.9*   < > 2.6* 2.3 2.3 2.3  --  2.0   < > = values in this interval not displayed.   GFR: Estimated  Creatinine Clearance: 42.4 mL/min (by C-G formula based on SCr of 0.94 mg/dL). Liver Function Tests: No results for input(s): AST, ALT, ALKPHOS, BILITOT, PROT, ALBUMIN in the last 168 hours. No results for input(s): LIPASE, AMYLASE in the last 168 hours. No results for input(s): AMMONIA in the last 168 hours. Coagulation Profile: No results for input(s): INR, PROTIME in the last 168 hours. Cardiac Enzymes: No results for input(s): CKTOTAL, CKMB, CKMBINDEX, TROPONINI in the last 168 hours. BNP (last 3 results) No results for input(s): PROBNP in the last 8760 hours. HbA1C: No results for input(s): HGBA1C in the last 72 hours. CBG: Recent Labs  Lab 08/05/19 2150  GLUCAP 100*   Lipid Profile: No results for input(s): CHOL, HDL, LDLCALC, TRIG, CHOLHDL, LDLDIRECT in the last 72 hours. Thyroid Function Tests: No results for input(s): TSH, T4TOTAL, FREET4, T3FREE, THYROIDAB in the last 72 hours. Anemia Panel: Recent Labs    08/06/19 0639  RETICCTPCT 7.8*   Sepsis Labs: No results for input(s): PROCALCITON, LATICACIDVEN in the last 168 hours.  No results found for this or any previous visit (from the past 240 hour(s)).    Radiology Studies: DG Chest Port 1 View  Result Date: 08/07/2019 CLINICAL DATA:  Dyspnea EXAM: PORTABLE CHEST 1 VIEW COMPARISON:  08/03/2019 FINDINGS: Cardiac shadow is enlarged but stable. Aortic calcifications are noted. Large hiatal hernia is again noted and stable. The lungs are well aerated. Small effusions are again noted and stable. Mild right basilar atelectasis is seen. IMPRESSION: Small effusions and right basilar atelectasis. Electronically Signed   By: Inez Catalina M.D.   On: 08/07/2019 13:40        Scheduled Meds: . atorvastatin  80 mg Oral Daily  . diltiazem  240 mg Oral Daily  . donepezil  10 mg Oral QHS  . furosemide  40 mg Oral BID  . megestrol  400 mg Oral Daily  . melatonin  5 mg Oral Once  . memantine  10 mg Oral Daily  . metoprolol  tartrate  12.5 mg Oral BID  . multivitamin with minerals  1 tablet Oral Daily  . pantoprazole (PROTONIX) IV  40 mg Intravenous Q12H  . sertraline  50 mg Oral QHS  . sodium chloride flush  3 mL Intravenous Q12H   Continuous Infusions:   Assessment/Plan:  Severe sepsis secondary to E. coli urinary tract infection: Present on admission. Patient with leukocytosis, tachycardia, tachypnea, elevated lactic acid and AMS on admission.Patient has completed antibiotic course. Afebrile now although has persistent/fluctuating leucocytosis. No blasts or fragmented cells on peripheral smear per pathology review. Likely reactive per hematology-down to 11.9k today. BM biopsy deferred. She needs to follow up with hematology  Acute on chronic  diastolic heart failure: patient presented with sepsis and was given IV fluids,currently significantly volume overloaded ,admission weight 160 pounds-> 178 pounds. Cards changed oral Lasix back to IV Lasix over the weekend--increased frequency to twice daily given 3+ leg edema over the last couple of days with no significant improvement but adding Zaroxolyn x1 dose along with fluid restriction appears to be helping.  Weight today down to 170 pounds.  Will repeat Zaroxolyn dose with potassium supplementation and continue to monitor intake and output, daily weights.  Chest x-ray showed bibasilar atelectasis with small but no significant pleural effusions.  She maintained O2 sats on walking desaturation study yesterday but got tachypneic and tachycardic.  Acute metabolic encephalopathy with underlying dementia-secondary to the above-Continue Aricept, memantine, Seroquel -Minimize sedatives. She is pleasantly confused and cooperative in the last few days, now appear at baseline confusion.  Normocytic anemia, unclear etiology: Was on Eliquis prior to admission as well as oral iron which are currently held.Anemia panel showed iron 30, ferritin 55, folate 14.8, vitamin B12 440-iron  panel is not typical for iron deficiency per d/w hematology, Dr Cathie Hoops. LDH normal. Increased reticulocytosis can be seen acute GI bleed or hemolysis. T bilirubin is normal, less likely hemolysis.Hemoglobinnadir at 6.6, received one unit PRBC on 6/11. Patient is s/p EGD showing large hiatal hernia, normal otherwise. Patient not compliant with colonoscopy prep,GI recommend outpatient colonoscopy and signed off,Eliquis remains on hold. FOBT not done. CT abd /pelvis 07/23/19-- showed mitral valve calcification, moderate paraesophageal hernia , diverticulosis but no evidence of retroperitoneal hematoma or hepatosplenomegaly.   Check FOBT.  Atrial fibrillation with RVR--home med Coreg held. Cardiology recommended continuing diltiazem and to hold Eliquis in concern for anemia of unclear source/etiology. Pt started to have tachycardia on 6/13 pm lopressor added, follow final cardiology recommendations   Elevated troponin/demand ischemia-With history of CAD status post PCI in 2006 Relatively flat, although peaked to 64. No chest pain and inconsistent with ACS. Suspect demand ischemia.Continue statin  Essential hypertension-Continue Cardizem, lasix. Home meds coreg, spironolactone on hold.   Dyslipidemia-LDL 38, continue statin  Elevated BUN : sec to GIB vs dehydration/prerenal. BUN 50 on presentation, home med aldactone held, now normalized--monitor with diuresis.  Hyponatremia :Resolved.Sodium 130 on presentation-Suspect multifactorial including volume depletion, SSRI, poor oral intake.Patient was on IV fluids, now off and getting diuresis.Zoloft has been held.Continue Megace.Continue to monitor BMP  Hypomagnesemia/hypokalemia/hypophosphatemia-resolved initially with replacement but potassium level low again today with aggressive diuresis.  Will replace and give additional dose with Zaroxolyn.    Deconditioning/generalized weakness-PT and OT recommending home healththen snf    DVT  prophylaxis:  Eliquis on hold, SCDs Code Status:  Full code Family / Patient Communication: daughter Amy at bedside on 6/10, 6/11.  Will call and update today  Disposition Plan:   Status is: Inpatient  Remains inpatient appropriate because:Inpatient level of care appropriate due to severity of illness.Significantly volume overloaded,Needs IV diuresis.  Dispo: The patient is from: Home  Anticipated d/c is to: SNF rehab in a.m. as patient now agreeable.             Anticipated d/c date is: 2 days  Patient currently is not medically stable to d/c.    LOS: 17 days    Time spent: 35 MINS     Alessandra Bevels, MD [facility] Pager in [place]  If 7PM-7AM, please contact night-coverage [email or web address] 08/08/2019, 4:43 PM

## 2019-08-08 NOTE — Plan of Care (Signed)
  Problem: Coping: Goal: Level of anxiety will decrease Outcome: Not Progressing Impulsive and boisterous episodes continue.  Slept at short intervals after prn meds given.

## 2019-08-08 NOTE — Progress Notes (Signed)
Hematology/Oncology Progress Note [facility] [phone] [phone]  Patient Care Team: Valera Castle, MD as PCP - General (Family Medicine)   Name of the patient: Kimberly Wang  [id number]  [birth year]  Date of visit: 08/08/19   INTERVAL HISTORY-  Patient was sitting in the chair.  She has no new complaints.  She feels hungry and is waiting for her lunch. Denies any pain.    Review of systems- Review of Systems  Unable to perform ROS: Dementia    Allergies  Allergen Reactions  . Latex Hives and Rash  . Lidocaine Hcl Other (See Comments)    Other Reaction: Other reaction. Patient can't take Novacaine  . Codeine Other (See Comments)  . Morphine Other (See Comments)  . Novocain [Procaine]     Heart races  . Other Other (See Comments)    Uncoded Allergy. Allergen: RED PEPPER    Patient Active Problem List   Diagnosis Date Noted  . A-fib ([place]) 08/01/2019  . Sepsis due to urinary tract infection ([place])   . Anemia   . Sepsis due to gram-negative UTI ([place]) 07/22/2019  . Severe sepsis ([place]) 07/22/2019  . Dementia due to Parkinson's disease without behavioral disturbance ([place]) 07/22/2019  . Benign essential HTN 07/22/2019  . CAD (coronary artery disease) 07/22/2019  . Pain due to onychomycosis of toenails of both feet 05/17/2019  . Coagulation defect ([place]) 05/17/2019     Past Medical History:  Diagnosis Date  . Coronary artery disease   . Dementia ([place])   . Hypertension   . Myocardial infarct [facility])      Past Surgical History:  Procedure Laterality Date  . CHOLECYSTECTOMY    . CORONARY STENT PLACEMENT    . ESOPHAGOGASTRODUODENOSCOPY (EGD) WITH PROPOFOL N/A 07/31/2019   Procedure: ESOPHAGOGASTRODUODENOSCOPY (EGD) WITH PROPOFOL;  Surgeon: Lin Landsman, MD;  Location: [facility] ENDOSCOPY;  Service: Gastroenterology;  Laterality: N/A;    Social History   Socioeconomic History  . Marital status: Married    Spouse  name: Not on file  . Number of children: Not on file  . Years of education: Not on file  . Highest education level: Not on file  Occupational History  . Not on file  Tobacco Use  . Smoking status: Never Smoker  . Smokeless tobacco: Never Used  Vaping Use  . Vaping Use: Never used  Substance and Sexual Activity  . Alcohol use: Never  . Drug use: Never  . Sexual activity: Not on file  Other Topics Concern  . Not on file  Social History Narrative  . Not on file   Social Determinants of Health   Financial Resource Strain:   . Difficulty of Paying Living Expenses:   Food Insecurity:   . Worried About Charity fundraiser in the Last Year:   . Arboriculturist in the Last Year:   Transportation Needs:   . Film/video editor (Medical):   Marland Kitchen Lack of Transportation (Non-Medical):   Physical Activity:   . Days of Exercise per Week:   . Minutes of Exercise per Session:   Stress:   . Feeling of Stress :   Social Connections:   . Frequency of Communication with Friends and Family:   . Frequency of Social Gatherings with Friends and Family:   . Attends Religious Services:   . Active Member of Clubs or Organizations:   . Attends Archivist Meetings:   Marland Kitchen Marital Status:   Intimate Production manager  Violence:   . Fear of Current or Ex-Partner:   . Emotionally Abused:   Marland Kitchen Physically Abused:   . Sexually Abused:      History reviewed. No pertinent family history.   Current Facility-Administered Medications:  .  acetaminophen (TYLENOL) tablet 650 mg, 650 mg, Oral, Q6H PRN, 650 mg at 07/25/19 2125 **OR** acetaminophen (TYLENOL) suppository 650 mg, 650 mg, Rectal, Q6H PRN, Mansy, Jan A, MD .  atorvastatin (LIPITOR) tablet 80 mg, 80 mg, Oral, Daily, Mansy, Jan A, MD, 80 mg at 08/07/19 0810 .  clonazePAM (KLONOPIN) disintegrating tablet 0.25 mg, 0.25 mg, Oral, BID PRN, Alessandra Bevels, MD, 0.25 mg at 08/07/19 1549 .  diltiazem (CARDIZEM CD) 24 hr capsule 240 mg, 240 mg, Oral,  Daily, Mansy, Jan A, MD, 240 mg at 08/07/19 0809 .  donepezil (ARICEPT) tablet 10 mg, 10 mg, Oral, QHS, Mansy, Jan A, MD, 10 mg at 08/07/19 2124 .  furosemide (LASIX) tablet 40 mg, 40 mg, Oral, BID, Dalia Heading, MD, 40 mg at 08/07/19 1758 .  ipratropium-albuterol (DUONEB) 0.5-2.5 (3) MG/3ML nebulizer solution 3 mL, 3 mL, Nebulization, Q6H PRN, Jimmye Norman, NP, 3 mL at 08/05/19 1657 .  magnesium hydroxide (MILK OF MAGNESIA) suspension 30 mL, 30 mL, Oral, Daily PRN, Mansy, Jan A, MD .  megestrol (MEGACE) 400 MG/10ML suspension 400 mg, 400 mg, Oral, Daily, Drema Dallas, MD, 400 mg at 08/07/19 0809 .  melatonin tablet 5 mg, 5 mg, Oral, Once, Manuela Schwartz, NP .  [facility]) tablet 10 mg, 10 mg, Oral, Daily, Mansy, Jan A, MD, 10 mg at 08/07/19 0809 .  metoprolol tartrate (LOPRESSOR) tablet 12.5 mg, 12.5 mg, Oral, BID, Albertine Grates, MD, 12.5 mg at 08/07/19 2124 .  multivitamin with minerals tablet 1 tablet, 1 tablet, Oral, Daily, Valrie Hart A, RPH, 1 tablet at 08/07/19 0810 .  nitroGLYCERIN (NITROSTAT) SL tablet 0.4 mg, 0.4 mg, Sublingual, Q5 min PRN, Mansy, Jan A, MD .  ondansetron (ZOFRAN) tablet 4 mg, 4 mg, Oral, Q6H PRN **OR** ondansetron (ZOFRAN) injection 4 mg, 4 mg, Intravenous, Q6H PRN, Mansy, Jan A, MD, 4 mg at 08/02/19 1948 .  pantoprazole (PROTONIX) injection 40 mg, 40 mg, Intravenous, Q12H, Marguerita Merles Casper, DO, 40 mg at 08/07/19 2122 .  potassium chloride SA (KLOR-CON) CR tablet 40 mEq, 40 mEq, Oral, Once, Kamineni, Neelima, MD .  QUEtiapine (SEROQUEL) tablet 25 mg, 25 mg, Oral, QHS PRN, Jimmye Norman, NP, 25 mg at 08/07/19 2123 .  sertraline (ZOLOFT) tablet 50 mg, 50 mg, Oral, QHS, Ronnald Ramp, RPH, 50 mg at 08/07/19 2123 .  sodium chloride flush (NS) 0.9 % injection 3 mL, 3 mL, Intravenous, Q12H, Kamineni, Neelima, MD, 3 mL at 08/07/19 2122 .  traZODone (DESYREL) tablet 25 mg, 25 mg, Oral, QHS PRN, Mansy, Jan A, MD, 25 mg at 08/07/19  2123   Physical exam:  Vitals:   08/07/19 2008 08/08/19 0020 08/08/19 0203 08/08/19 0635  BP: 113/77   117/88  Pulse: (!) 104   96  Resp:    19  Temp: 98.6 F (37 C)   97.9 F (36.6 C)  TempSrc: Oral   Oral  SpO2: 100%   100%  Weight:  170 lb 3.2 oz (77.2 kg) 170 lb 3.2 oz (77.2 kg)   Height:       Physical Exam Constitutional:      General: She is not in acute distress.    Appearance: She is not diaphoretic.  HENT:  Head: Normocephalic and atraumatic.     Mouth/Throat:     Pharynx: No oropharyngeal exudate.  Eyes:     General: No scleral icterus.    Pupils: Pupils are equal, round, and reactive to light.  Cardiovascular:     Rate and Rhythm: Normal rate and regular rhythm.     Heart sounds: No murmur heard.   Pulmonary:     Effort: Pulmonary effort is normal. No respiratory distress.     Breath sounds: No rales.  Chest:     Chest wall: No tenderness.  Abdominal:     Palpations: Abdomen is soft.  Musculoskeletal:        General: Swelling present. Normal range of motion.     Cervical back: Normal range of motion and neck supple.  Skin:    General: Skin is warm and dry.     Coloration: Skin is pale.     Findings: No erythema.  Neurological:     Mental Status: She is alert. Mental status is at baseline.     Comments: Pleasantly confused  Psychiatric:        Mood and Affect: Affect normal.        CMP Latest Ref Rng & Units 08/08/2019  Glucose 70 - 99 mg/dL 97  BUN 8 - 23 mg/dL 16(X)  Creatinine 0.96 - 1.00 mg/dL 0.45  Sodium 409 - 811 mmol/L 137  Potassium 3.5 - 5.1 mmol/L 3.0(L)  Chloride 98 - 111 mmol/L 99  CO2 22 - 32 mmol/L 29  Calcium 8.9 - 10.3 mg/dL 8.2(L)  Total Protein 6.5 - 8.1 g/dL -  Total Bilirubin 0.3 - 1.2 mg/dL -  Alkaline Phos 38 - 914 U/L -  AST 15 - 41 U/L -  ALT 0 - 44 U/L -   CBC Latest Ref Rng & Units 08/08/2019  WBC 4.0 - 10.5 K/uL 8.9  Hemoglobin 12.0 - 15.0 g/dL 7.8(G)  Hematocrit 36 - 46 % 27.7(L)  Platelets 150 - 400  K/uL 312    RADIOGRAPHIC STUDIES: I have personally reviewed the radiological images as listed and agreed with the findings in the report. CT ABDOMEN PELVIS WO CONTRAST  Result Date: 07/22/2019 CLINICAL DATA:  Sepsis EXAM: CT ABDOMEN AND PELVIS WITHOUT CONTRAST TECHNIQUE: Multidetector CT imaging of the abdomen and pelvis was performed following the standard protocol without IV contrast. COMPARISON:  None. FINDINGS: Lower chest: There is mild cardiomegaly. Mitral valve calcifications. A moderate paraesophageal hernia containing contrast is seen. Hepatobiliary: Although limited due to the lack of intravenous contrast, normal in appearance without gross focal abnormality. The patient is status post cholecystectomy. No biliary ductal dilation. Pancreas:  Unremarkable.  No surrounding inflammatory changes. Spleen: Normal in size. Although limited due to the lack of intravenous contrast, normal in appearance. Adrenals/Urinary Tract: Both adrenal glands appear normal. The kidneys and collecting system appear normal without evidence of urinary tract calculus or hydronephrosis. Bladder is unremarkable. Stomach/Bowel: The stomach, small bowel, are normal in appearance. There is scattered colonic diverticula without diverticulitis. Vascular/Lymphatic: There are no enlarged abdominal or pelvic lymph nodes. Scattered aortic atherosclerotic calcifications are seen without aneurysmal dilatation. Reproductive: The uterus and adnexa are unremarkable. Other: A small fat and transverse colon containing umbilical hernia seen within the mid abdomen. Musculoskeletal: No acute or significant osseous findings. IMPRESSION: Moderate paraesophageal hernia. Diverticulosis without diverticulitis. No other acute intra-abdominal or pelvic pathology to explain the patient's symptoms. Aortic Atherosclerosis (ICD10-I70.0). Electronically Signed   By: Jonna Clark M.D.   On: 07/22/2019 01:40  DG Chest 1 View  Result Date:  07/27/2019 CLINICAL DATA:  Shortness of breath EXAM: CHEST  1 VIEW COMPARISON:  July 26, 2019 FINDINGS: There is unchanged mild cardiomegaly. There is aortic knob and descending intrathoracic aortic calcifications. There is slight interval improvement in the prominence of the central pulmonary vasculature. No acute osseous abnormality. IMPRESSION: Slight interval improvement in the pulmonary vascular congestion. Electronically Signed   By: Jonna Clark M.D.   On: 07/27/2019 06:04   DG Chest 1 View  Result Date: 07/26/2019 CLINICAL DATA:  Shortness of breath. EXAM: CHEST  1 VIEW COMPARISON:  07/23/2019. FINDINGS: Mediastinum hilar structures normal. Cardiomegaly. Mild bilateral interstitial prominence and small pleural effusions. CHF could present in this fashion. Sliding hiatal hernia cannot be excluded. IMPRESSION: Cardiomegaly. Mild bilateral interstitial prominence and small pleural effusions. CHF could present in this fashion. Electronically Signed   By: Maisie Fus  Register   On: 07/26/2019 07:52   DG Chest 1 View  Result Date: 07/23/2019 CLINICAL DATA:  Dyspnea EXAM: CHEST  1 VIEW COMPARISON:  Chest radiograph from one day prior. FINDINGS: Stable cardiomediastinal silhouette with mild cardiomegaly. No pneumothorax. Stable small left pleural effusion. No right pleural effusion. No overt pulmonary edema. Streaky bibasilar lung opacities are similar IMPRESSION: Stable small left pleural effusion. Stable streaky bibasilar lung opacities, favor atelectasis or scarring. Stable mild cardiomegaly without overt pulmonary edema. Electronically Signed   By: Delbert Phenix M.D.   On: 07/23/2019 04:50   DG Chest 1 View  Result Date: 07/21/2019 CLINICAL DATA:  Sepsis. EXAM: CHEST  1 VIEW COMPARISON:  None. FINDINGS: There are small bilateral pleural effusions. There is a left perihilar density of unknown clinical significance. The heart size is mildly enlarged. There are dense aortic calcifications. There are few streaky  airspace opacities at the lung bases favored to represent areas of atelectasis. There is no pneumothorax. IMPRESSION: 1. Small bilateral pleural effusions. 2. Mild cardiomegaly. 3. Slightly prominent left perihilar region may be secondary to a dilated pulmonary artery or lymphadenopathy. A follow-up two-view chest x-ray is recommended in 4-6 weeks. Electronically Signed   By: Katherine Mantle M.D.   On: 07/21/2019 22:34   DG Chest Port 1 View  Result Date: 08/07/2019 CLINICAL DATA:  Dyspnea EXAM: PORTABLE CHEST 1 VIEW COMPARISON:  08/03/2019 FINDINGS: Cardiac shadow is enlarged but stable. Aortic calcifications are noted. Large hiatal hernia is again noted and stable. The lungs are well aerated. Small effusions are again noted and stable. Mild right basilar atelectasis is seen. IMPRESSION: Small effusions and right basilar atelectasis. Electronically Signed   By: Alcide Clever M.D.   On: 08/07/2019 13:40   DG Chest Port 1 View  Result Date: 08/03/2019 CLINICAL DATA:  Cough EXAM: PORTABLE CHEST 1 VIEW COMPARISON:  07/27/2019 FINDINGS: Cardiac shadow remains enlarged. Aortic calcifications are again seen. Small bilateral pleural effusions are noted stable from the prior exam. No focal infiltrate is noted. No acute bony abnormality is seen. IMPRESSION: Small effusions bilaterally.  No acute abnormality noted. Electronically Signed   By: Alcide Clever M.D.   On: 08/03/2019 17:17   DG Chest Port 1 View  Result Date: 07/22/2019 CLINICAL DATA:  Shortness of breath. EXAM: PORTABLE CHEST 1 VIEW COMPARISON:  Jul 21, 2019 FINDINGS: Cardiomegaly. Skin fold over the lateral right chest. The hila and mediastinum are normal. No pneumothorax. No nodules or masses. No focal infiltrates. IMPRESSION: No active disease. Electronically Signed   By: Gerome Sam III M.D   On: 07/22/2019 15:12  ECHOCARDIOGRAM COMPLETE  Result Date: 07/27/2019    ECHOCARDIOGRAM REPORT   Patient Name:   Edd ArbourDIANE Perea Date of Exam: 07/26/2019  Medical Rec #:  [id number]   Height:       64.0 in Accession #:    [id number]  Weight:       164.2 lb Date of Birth:  05/25/1930    BSA:          1.799 m Patient Age:    86 years    BP:           117/65 mmHg Patient Gender: F           HR:           124 bpm. Exam Location:  [facility] Procedure: 2D Echo, Cardiac Doppler and Color Doppler Indications:     Dyspnea 786.09 / R06.00  History:         Patient has no prior history of Echocardiogram examinations.                  CAD; Risk Factors:Hypertension. MI.  Sonographer:     Neysa Bonitohristy Roar Referring Phys:  [id number] Kateri McMAIR LATIF [facility] Diagnosing Phys: Lorine BearsMuhammad Arida MD IMPRESSIONS  1. Left ventricular ejection fraction, by estimation, is 40 to 45%. The left ventricle has mildly decreased function. The left ventricle demonstrates regional wall motion abnormalities with possible anterior/anteroseptal hypokinesis. There is mild left ventricular hypertrophy. Left ventricular diastolic parameters are indeterminate.  2. Right ventricular systolic function is normal. The right ventricular size is normal. There is mildly elevated pulmonary artery systolic pressure.  3. Left atrial size was severely dilated.  4. Right atrial size was moderately dilated.  5. The mitral valve is abnormal. Moderate mitral valve regurgitation. No evidence of mitral stenosis.  6. The aortic valve is abnormal. Aortic valve regurgitation is moderate. Mild aortic valve sclerosis is present, with no evidence of aortic valve stenosis. FINDINGS  Left Ventricle: Left ventricular ejection fraction, by estimation, is 40 to 45%. The left ventricle has mildly decreased function. The left ventricle demonstrates regional wall motion abnormalities. The left ventricular internal cavity size was normal in size. There is mild left ventricular hypertrophy. Left ventricular diastolic parameters are indeterminate. Right Ventricle: The right ventricular size is normal. No increase in right ventricular wall thickness. Right  ventricular systolic function is normal. There is mildly elevated pulmonary artery systolic pressure. The tricuspid regurgitant velocity is 2.69  m/s, and with an assumed right atrial pressure of 10 mmHg, the estimated right ventricular systolic pressure is 38.9 mmHg. Left Atrium: Left atrial size was severely dilated. Right Atrium: Right atrial size was moderately dilated. Pericardium: There is no evidence of pericardial effusion. Mitral Valve: The mitral valve is abnormal. There is moderate thickening of the mitral valve leaflet(s). There is mild calcification of the mitral valve leaflet(s). Normal mobility of the mitral valve leaflets. Moderate mitral annular calcification. Moderate mitral valve regurgitation. No evidence of mitral valve stenosis. Tricuspid Valve: The tricuspid valve is normal in structure. Tricuspid valve regurgitation is not demonstrated. No evidence of tricuspid stenosis. Aortic Valve: The aortic valve is abnormal. Aortic valve regurgitation is moderate. Mild aortic valve sclerosis is present, with no evidence of aortic valve stenosis. Aortic valve mean gradient measures 7.5 mmHg. Aortic valve peak gradient measures 14.1 mmHg. Aortic valve area, by VTI measures 1.73 cm. Pulmonic Valve: The pulmonic valve was normal in structure. Pulmonic valve regurgitation is not visualized. No evidence of pulmonic stenosis. Aorta: The aortic root is normal in  size and structure. Venous: The inferior vena cava was not well visualized. IAS/Shunts: No atrial level shunt detected by color flow Doppler.  LEFT VENTRICLE PLAX 2D LVIDd:         4.47 cm  Diastology LVIDs:         3.12 cm  LV e' lateral:   6.64 cm/s LV PW:         1.14 cm  LV E/e' lateral: 23.8 LV IVS:        1.06 cm  LV e' medial:    8.81 cm/s LVOT diam:     1.70 cm  LV E/e' medial:  17.9 LV SV:         52 LV SV Index:   29 LVOT Area:     2.27 cm  RIGHT VENTRICLE RV Mid diam:    2.29 cm RV S prime:     12.70 cm/s TAPSE (M-mode): 1.6 cm LEFT ATRIUM              Index       RIGHT ATRIUM           Index LA diam:        4.20 cm 2.33 cm/m  RA Area:     19.00 cm LA Vol (A2C):   92.9 ml 51.64 ml/m RA Volume:   44.60 ml  24.79 ml/m LA Vol (A4C):   82.8 ml 46.02 ml/m LA Biplane Vol: 91.4 ml 50.80 ml/m  AORTIC VALVE                    PULMONIC VALVE AV Area (Vmax):    1.54 cm     PV Vmax:        1.10 m/s AV Area (Vmean):   1.56 cm     PV Peak grad:   4.8 mmHg AV Area (VTI):     1.73 cm     RVOT Peak grad: 2 mmHg AV Vmax:           187.50 cm/s AV Vmean:          123.000 cm/s AV VTI:            0.301 m AV Peak Grad:      14.1 mmHg AV Mean Grad:      7.5 mmHg LVOT Vmax:         127.00 cm/s LVOT Vmean:        84.500 cm/s LVOT VTI:          0.229 m LVOT/AV VTI ratio: 0.76  AORTA Ao Root diam: 2.20 cm MITRAL VALVE                TRICUSPID VALVE MV Area (PHT): 3.16 cm     TR Peak grad:   28.9 mmHg MV Decel Time: 240 msec     TR Vmax:        269.00 cm/s MV E velocity: 158.00 cm/s                             SHUNTS                             Systemic VTI:  0.23 m                             Systemic Diam: 1.70 cm Lorine Bears MD Electronically  signed by Lorine Bears MD Signature Date/Time: 07/27/2019/8:13:34 AM    Final     Assessment and plan-   #Acute onset of anemia, Likely due to GI blood loss, and dilution.  Status post IV Venofer 200mg  x 3 doses. Eliquis has been stopped. Labs are reviewed. Hemoglobin has improved now above 8. Continue monitor daily..  Colonoscopy not able to be done as patient does not cooperate with bowel prep.  Leukocytosis, trended down.  Likely reactive to infection.. CHF, on Lasix.   Atrial fibrillation.  Cardiology on board.  Eliquis is hold due to GI bleeding risk. Her mental status secondary to dementia, seems to be at baseline.  Tthank you for allowing me to participate in the care of this patient.   , MD, PhD Hematology Oncology [facility] Pager- [facility] 08/08/2019

## 2019-08-08 NOTE — Progress Notes (Signed)
Patient up in recliner.  Frequent attempts to get up unassisted.  Declines having feet elevated.  Refuses to return to bed. Loud,  boisterous and hitting at staff with recommendations to elevate feet or return to bed for safety.  Cooperative with taking prn meds for anxiety.

## 2019-08-09 LAB — CBC WITH DIFFERENTIAL/PLATELET
Abs Immature Granulocytes: 0.08 10*3/uL — ABNORMAL HIGH (ref 0.00–0.07)
Basophils Absolute: 0 10*3/uL (ref 0.0–0.1)
Basophils Relative: 1 %
Eosinophils Absolute: 0.1 10*3/uL (ref 0.0–0.5)
Eosinophils Relative: 1 %
HCT: 28.8 % — ABNORMAL LOW (ref 36.0–46.0)
Hemoglobin: 9 g/dL — ABNORMAL LOW (ref 12.0–15.0)
Immature Granulocytes: 1 %
Lymphocytes Relative: 17 %
Lymphs Abs: 1.4 10*3/uL (ref 0.7–4.0)
MCH: 29 pg (ref 26.0–34.0)
MCHC: 31.3 g/dL (ref 30.0–36.0)
MCV: 92.9 fL (ref 80.0–100.0)
Monocytes Absolute: 0.7 10*3/uL (ref 0.1–1.0)
Monocytes Relative: 8 %
Neutro Abs: 6 10*3/uL (ref 1.7–7.7)
Neutrophils Relative %: 72 %
Platelets: 319 10*3/uL (ref 150–400)
RBC: 3.1 MIL/uL — ABNORMAL LOW (ref 3.87–5.11)
RDW: 18.1 % — ABNORMAL HIGH (ref 11.5–15.5)
Smear Review: NORMAL
WBC: 8.3 10*3/uL (ref 4.0–10.5)
nRBC: 0 % (ref 0.0–0.2)

## 2019-08-09 LAB — MAGNESIUM: Magnesium: 1.9 mg/dL (ref 1.7–2.4)

## 2019-08-09 LAB — BASIC METABOLIC PANEL
Anion gap: 12 (ref 5–15)
BUN: 30 mg/dL — ABNORMAL HIGH (ref 8–23)
CO2: 32 mmol/L (ref 22–32)
Calcium: 8.5 mg/dL — ABNORMAL LOW (ref 8.9–10.3)
Chloride: 94 mmol/L — ABNORMAL LOW (ref 98–111)
Creatinine, Ser: 1.04 mg/dL — ABNORMAL HIGH (ref 0.44–1.00)
GFR calc Af Amer: 56 mL/min — ABNORMAL LOW (ref >60)
GFR calc non Af Amer: 48 mL/min — ABNORMAL LOW (ref >60)
Glucose, Bld: 110 mg/dL — ABNORMAL HIGH (ref 70–99)
Potassium: 2.9 mmol/L — ABNORMAL LOW (ref 3.5–5.1)
Sodium: 138 mmol/L (ref 135–145)

## 2019-08-09 MED ORDER — POTASSIUM CHLORIDE CRYS ER 20 MEQ PO TBCR
40.0000 meq | EXTENDED_RELEASE_TABLET | Freq: Two times a day (BID) | ORAL | Status: AC
  Administered 2019-08-09 (×2): 40 meq via ORAL
  Filled 2019-08-09 (×2): qty 2

## 2019-08-09 MED ORDER — POTASSIUM CHLORIDE 10 MEQ/100ML IV SOLN
10.0000 meq | INTRAVENOUS | Status: AC
  Administered 2019-08-09 (×2): 10 meq via INTRAVENOUS
  Filled 2019-08-09 (×2): qty 100

## 2019-08-09 MED ORDER — BUMETANIDE 1 MG PO TABS
1.0000 mg | ORAL_TABLET | Freq: Two times a day (BID) | ORAL | Status: DC
  Administered 2019-08-09 – 2019-08-10 (×2): 1 mg via ORAL
  Filled 2019-08-09 (×4): qty 1

## 2019-08-09 NOTE — Progress Notes (Signed)
PROGRESS NOTE    Kimberly Wang  NWG:[id number]  DOB: Dec 10, 1930  PCP: [name], MD Admit date:07/22/2019 86 y.o.F with h/o CAD, HTN and dementia, presented to the emergency room with acute onset of AMS, generalized weakness, concern for poor oral intake and vomiting 1 associated with low grade fever . ED-Course: Temp 100.2 and heart rate 101 with otherwise normal vital signs. CMP revealed hyponatremia and hypokalemia as well as hypochloremia with blood glucose of 223, BUN of 18 and creatinine 1.14, albumin of 3.4. Lactic acid was 3.8 and later 3.7. High-sensitivity troponin I was 60. CBC showed leukocytosis of 25.1 with neutrophilia. Urinalysis showed 6-10 WBCs with positive nitrite and trace leukocytes. Blood cultures were drawn and urine culture was sent. Abdominal and pelvic CT scan revealed paraesophageal hernia with no acute findings. Chest x-ray showed bilateral small pleural effusions with mild cardiomegaly. Patient received IV fluids, IV cefepime and a gram of IV vancomycin. Hospital course: Patient admitted to [facility] for further evaluation and management of UTI with sepsis. HC complicated by worsening anemia requiring PRBC transfusion and EGD on 6/8, volume overload requiring IV diuresis and A fib with RVR. Prompting cardiology evaluation . Also evaluated by Hematology for leucocytosis/anemia  Subjective:  Patient sitting in bedside chair.  Her weight I further down to 166 pounds today and leg swellings look much improved.  She states she is watching her oral fluid intake.  She is now agreeable to going to SNF per PT recommendations.  Objective: Vitals:   08/09/19 0620 08/09/19 0630 08/09/19 0721 08/09/19 1236  BP:  113/69 121/73 (!) 118/55  Pulse:  85 79 70  Resp: 14 19 18 16   Temp:  98.2 F (36.8 C) 98 F (36.7 C) 97.7 F (36.5 C)  TempSrc:  Oral Oral Oral  SpO2:  96% 99% 96%  Weight:  75.7 kg    Height:        Intake/Output Summary (Last 24 hours) at  08/09/2019 1504 Last data filed at 08/09/2019 1407 Gross per 24 hour  Intake 720 ml  Output 3001 ml  Net -2281 ml   Filed Weights   08/08/19 0020 08/08/19 0203 08/09/19 0630  Weight: 77.2 kg 77.2 kg 75.7 kg    Physical Examination:  General exam: Appears comfortable, on room air Respiratory system: Clear to auscultation but decreased breath sounds at bases. Respiratory effort mildly increased on talking full sentences.  Cardiovascular system: S1 & S2 heard, RRR. No JVD, murmurs, rubs, gallops or clicks.  3+ pedal edema. Gastrointestinal system: Abdomen is obese, nondistended, soft and nontender. Normal bowel sounds  Central nervous system: Alert and oriented. No new focal neurological deficits. Extremities: No contractures, improving leg swellings with 2+ pitting edema bilaterally Skin: No rashes, lesions or ulcers Psychiatry: Judgement and insight appear normal. Mood & affect upbeat.   Data Reviewed: I have personally reviewed following labs and imaging studies  CBC: Recent Labs  Lab 08/04/19 0634 08/06/19 0639 08/07/19 0525 08/08/19 0446 08/09/19 0446  WBC 16.0* 19.9* 11.9* 8.9 8.3  NEUTROABS 13.8* 17.6* 10.0* 6.8 6.0  HGB 7.4* 8.8* 8.2* 8.8* 9.0*  HCT 24.1* 26.8* 26.2* 27.7* 28.8*  MCV 94.1 92.4 94.9 93.0 92.9  PLT 311 334 301 312 319   Basic Metabolic Panel: Recent Labs  Lab 08/04/19 0634 08/04/19 0634 08/05/19 0457 08/06/19 0639 08/07/19 0525 08/07/19 0811 08/08/19 0446 08/08/19 1701 08/09/19 0446  NA 137  --  134*  --   --  133* 137  --  138  K 4.5   < > 3.6  --   --  4.2 3.0* 3.1* 2.9*  CL 105  --  102  --   --  100 99  --  94*  CO2 24  --  22  --   --  24 29  --  32  GLUCOSE 99  --  102*  --   --  90 97  --  110*  BUN 18  --  18  --   --  26* 24*  --  30*  CREATININE 1.06*  --  0.95  --   --  0.97 0.94  --  1.04*  CALCIUM 8.1*  --  7.7*  --   --  8.1* 8.2*  --  8.5*  MG 2.6*   < > 2.3 2.3 2.3  --  2.0  --  1.9   < > = values in this interval not  displayed.   GFR: Estimated Creatinine Clearance: 38 mL/min (A) (by C-G formula based on SCr of 1.04 mg/dL (H)). Liver Function Tests: No results for input(s): AST, ALT, ALKPHOS, BILITOT, PROT, ALBUMIN in the last 168 hours. No results for input(s): LIPASE, AMYLASE in the last 168 hours. No results for input(s): AMMONIA in the last 168 hours. Coagulation Profile: No results for input(s): INR, PROTIME in the last 168 hours. Cardiac Enzymes: No results for input(s): CKTOTAL, CKMB, CKMBINDEX, TROPONINI in the last 168 hours. BNP (last 3 results) No results for input(s): PROBNP in the last 8760 hours. HbA1C: No results for input(s): HGBA1C in the last 72 hours. CBG: Recent Labs  Lab 08/05/19 2150  GLUCAP 100*   Lipid Profile: No results for input(s): CHOL, HDL, LDLCALC, TRIG, CHOLHDL, LDLDIRECT in the last 72 hours. Thyroid Function Tests: No results for input(s): TSH, T4TOTAL, FREET4, T3FREE, THYROIDAB in the last 72 hours. Anemia Panel: No results for input(s): VITAMINB12, FOLATE, FERRITIN, TIBC, IRON, RETICCTPCT in the last 72 hours. Sepsis Labs: No results for input(s): PROCALCITON, LATICACIDVEN in the last 168 hours.  Recent Results (from the past 240 hour(s))  SARS Coronavirus 2 by RT PCR (hospital order, performed in [facility] hospital lab) Nasopharyngeal Nasopharyngeal Swab     Status: None   Collection Time: 08/08/19  5:36 PM   Specimen: Nasopharyngeal Swab  Result Value Ref Range Status   SARS Coronavirus 2 NEGATIVE NEGATIVE Final    Comment: (NOTE) SARS-CoV-2 target nucleic acids are NOT DETECTED.  The SARS-CoV-2 RNA is generally detectable in upper and lower respiratory specimens during the acute phase of infection. The lowest concentration of SARS-CoV-2 viral copies this assay can detect is 250 copies / mL. A negative result does not preclude SARS-CoV-2 infection and should not be used as the sole basis for treatment or other patient management decisions.  A  negative result may occur with improper specimen collection / handling, submission of specimen other than nasopharyngeal swab, presence of viral mutation(s) within the areas targeted by this assay, and inadequate number of viral copies (<250 copies / mL). A negative result must be combined with clinical observations, patient history, and epidemiological information.  Fact Sheet for Patients:   StrictlyIdeas.no  Fact Sheet for Healthcare Providers: BankingDealers.co.za  This test is not yet approved or  cleared by the [place] FDA and has been authorized for detection and/or diagnosis of SARS-CoV-2 by FDA under an Emergency Use Authorization (EUA).  This EUA will remain in effect (meaning this test can be used) for the duration of the COVID-19  declaration under Section 564(b)(1) of the Act, 21 U.S.C. section 360bbb-3(b)(1), unless the authorization is terminated or revoked sooner.  Performed at [facility], [address], [place], [place] [address]       Radiology Studies: No results found.      Scheduled Meds: . atorvastatin  80 mg Oral Daily  . bumetanide  1 mg Oral BID  . diltiazem  240 mg Oral Daily  . donepezil  10 mg Oral QHS  . megestrol  400 mg Oral Daily  . melatonin  5 mg Oral Once  . memantine  10 mg Oral Daily  . metoprolol tartrate  12.5 mg Oral BID  . multivitamin with minerals  1 tablet Oral Daily  . pantoprazole (PROTONIX) IV  40 mg Intravenous Q12H  . potassium chloride  40 mEq Oral BID  . sertraline  50 mg Oral QHS  . sodium chloride flush  3 mL Intravenous Q12H   Continuous Infusions: . potassium chloride 10 mEq (08/09/19 1413)     Assessment/Plan:  Severe sepsis secondary to E. coli urinary tract infection: Present on admission. Patient with leukocytosis, tachycardia, tachypnea, elevated lactic acid and AMS on admission.Patient has completed antibiotic course. Afebrile now  although has persistent/fluctuating leucocytosis. No blasts or fragmented cells on peripheral smear per pathology review. Likely reactive per hematology-down to 11.9k today. BM biopsy deferred. She needs to follow up with hematology  Acute on chronic diastolic heart failure: patient presented with sepsis and was given IV fluids,currently significantly volume overloaded ,admission weight 160 pounds-> 178 pounds. Cards changed oral Lasix back to IV Lasix over the weekend--increased frequency to twice daily given 3+ leg edema over the last couple of days with no significant improvement but adding Zaroxolyn x1 dose along with fluid restriction appears to be helping.  Weight today down to 166 pounds.  Will change Lasix to Bumex for maintenance dosing with potassium supplementation in view of inadequate response to Lasix, continue to monitor intake and output, daily weights.  (She was not responding to p.o. Lasix 80 mg twice daily at home and states had persistent leg swellings).  Chest x-ray showed bibasilar atelectasis with small but no significant pleural effusions.  She maintained O2 sats on walking desaturation study but got tachypneic and tachycardic.  Severe hypokalemia: In the setting of diuresis. Replace aggressively today-IV and PO. Also add daily supplements with scheduled diuretic.   Acute metabolic encephalopathy with underlying dementia-secondary to acute infection-Continue Aricept, memantine, Seroquel -Minimize sedatives.   Now resolved and at baseline.  Oriented x3 and quite communicative.  Normocytic anemia, unclear etiology: Was on Eliquis prior to admission as well as oral iron which are currently held.Anemia panel showed iron 30, ferritin 55, folate 14.8, vitamin B12 440-iron panel is not typical for iron deficiency per d/w hematology, Dr Cathie Hoops. LDH normal. Increased reticulocytosis can be seen acute GI bleed or hemolysis. T bilirubin is normal, less likely hemolysis.Hemoglobinnadir at  6.6, received one unit PRBC on 6/11. Patient is s/p EGD showing large hiatal hernia, normal otherwise. Patient not compliant with colonoscopy prep,GI recommend outpatient colonoscopy and signed off,Eliquis remains on hold. FOBT not done. CT abd /pelvis 07/23/19-- showed mitral valve calcification, moderate paraesophageal hernia , diverticulosis but no evidence of retroperitoneal hematoma or hepatosplenomegaly.  Fecal occult blood test ordered and still pending.  Atrial fibrillation with RVR--home med Coreg held. Cardiology recommended continuing diltiazem and to hold Eliquis in concern for anemia of unclear source/etiology. Pt started to have tachycardia on 6/13 pm lopressor added,  follow final cardiology recommendations  Elevated troponin/demand ischemia-With history of CAD status post PCI in 2006 Relatively flat, although peaked to 64. No chest pain and inconsistent with ACS. Suspect demand ischemia.Continue statin  Essential hypertension-Continue Cardizem, lasix. Home meds coreg, spironolactone on hold.   Dyslipidemia-LDL 38, continue statin  Elevated BUN : sec to GIB vs dehydration/prerenal. BUN 50 on presentation, home med aldactone held, now normalized--monitor with diuresis.  Hyponatremia :Resolved.Sodium 130 on presentation-Suspect multifactorial including volume depletion, SSRI, poor oral intake.Patient was on IV fluids, now off and getting diuresis.Zoloft has been held.Continue Megace.Continue to monitor BMP  Hypomagnesemia//hypophosphatemia-resolved with replacement, continue to monitor and replace as needed  Deconditioning/generalized weakness-PT and OT recommending SNF    DVT prophylaxis:  Eliquis on hold, SCDs Code Status:  Full code Family / Patient Communication:  Discussed with patient and all questions answered to satisfaction. Disposition Plan:   Status is: Inpatient  Remains inpatient appropriate because:Inpatient level of care appropriate due to severity of  illness.Significantly volume overloaded,Needs IV diuresis.  Dispo: The patient is from: Home  Anticipated d/c is to: SNF rehab in a.m. as patient now agreeable.             Anticipated d/c date is: 6/18  Patient currently is medically stable to d/c.  Covid screen for possible SNF placement today or tomorrow.    LOS: 18 days    Time spent: 35 MINS     Alessandra Bevels, MD [facility] Pager in [place]  If 7PM-7AM, please contact night-coverage [email or web address] 08/09/2019, 3:04 PM

## 2019-08-09 NOTE — Care Management Important Message (Signed)
Important Message  Patient Details  Name: Kimberly Wang MRN: [id number] Date of Birth: 13-Jan-1931   Medicare Important Message Given:  Yes     [name] 08/09/2019, 2:09 PM

## 2019-08-09 NOTE — Progress Notes (Signed)
PT Cancellation Note  Patient Details Name: Kimberly Wang MRN: [id number] DOB: [birth year]   Cancelled Treatment:     PT attempt. Pt refused. She is pleasantly confused and max encouragement given to participate however pt continued to be unwilling.     Rushie Chestnut 08/09/2019, 4:12 PM

## 2019-08-09 NOTE — TOC Progression Note (Signed)
Transition of Care [facility]) - Progression Note    Patient Details  Name: Kimberly Wang MRN: [id number] Date of Birth: 1930/05/21  Transition of Care [facility]) CM/SW Contact  Maree Krabbe, LCSW Phone Number: 08/09/2019, 1:14 PM  Clinical Narrative:   [facility] has received authorization for pt to admit. Per MD will d/c tomorrow. Facility aware.    Expected Discharge Plan: Skilled Nursing Facility Barriers to Discharge: Continued Medical Work up  Expected Discharge Plan and Services Expected Discharge Plan: Skilled Nursing Facility In-house Referral: Clinical Social Work Discharge Planning Services: CM Consult Post Acute Care Choice: Skilled Nursing Facility Living arrangements for the past 2 months: Independent Living Facility                                       Social Determinants of Health (SDOH) Interventions    Readmission Risk Interventions No flowsheet data found.

## 2019-08-10 ENCOUNTER — Telehealth: Payer: Self-pay

## 2019-08-10 DIAGNOSIS — D500 Iron deficiency anemia secondary to blood loss (chronic): Secondary | ICD-10-CM

## 2019-08-10 DIAGNOSIS — I5033 Acute on chronic diastolic (congestive) heart failure: Secondary | ICD-10-CM

## 2019-08-10 DIAGNOSIS — D649 Anemia, unspecified: Secondary | ICD-10-CM

## 2019-08-10 DIAGNOSIS — E871 Hypo-osmolality and hyponatremia: Secondary | ICD-10-CM

## 2019-08-10 DIAGNOSIS — E876 Hypokalemia: Secondary | ICD-10-CM

## 2019-08-10 LAB — CBC WITH DIFFERENTIAL/PLATELET
Abs Immature Granulocytes: 0.05 10*3/uL (ref 0.00–0.07)
Basophils Absolute: 0.1 10*3/uL (ref 0.0–0.1)
Basophils Relative: 1 %
Eosinophils Absolute: 0.1 10*3/uL (ref 0.0–0.5)
Eosinophils Relative: 1 %
HCT: 26.4 % — ABNORMAL LOW (ref 36.0–46.0)
Hemoglobin: 8.6 g/dL — ABNORMAL LOW (ref 12.0–15.0)
Immature Granulocytes: 1 %
Lymphocytes Relative: 15 %
Lymphs Abs: 1.3 10*3/uL (ref 0.7–4.0)
MCH: 29.2 pg (ref 26.0–34.0)
MCHC: 32.6 g/dL (ref 30.0–36.0)
MCV: 89.5 fL (ref 80.0–100.0)
Monocytes Absolute: 0.6 10*3/uL (ref 0.1–1.0)
Monocytes Relative: 7 %
Neutro Abs: 6.6 10*3/uL (ref 1.7–7.7)
Neutrophils Relative %: 75 %
Platelets: 298 10*3/uL (ref 150–400)
RBC: 2.95 MIL/uL — ABNORMAL LOW (ref 3.87–5.11)
RDW: 17.7 % — ABNORMAL HIGH (ref 11.5–15.5)
WBC: 8.7 10*3/uL (ref 4.0–10.5)
nRBC: 0 % (ref 0.0–0.2)

## 2019-08-10 LAB — RETIC PANEL
Immature Retic Fract: 28.3 % — ABNORMAL HIGH (ref 2.3–15.9)
RBC.: 2.86 MIL/uL — ABNORMAL LOW (ref 3.87–5.11)
Retic Count, Absolute: 163.6 10*3/uL (ref 19.0–186.0)
Retic Ct Pct: 5.7 % — ABNORMAL HIGH (ref 0.4–3.1)
Reticulocyte Hemoglobin: 26.6 pg — ABNORMAL LOW (ref >27.9)

## 2019-08-10 LAB — POTASSIUM: Potassium: 3.1 mmol/L — ABNORMAL LOW (ref 3.5–5.1)

## 2019-08-10 LAB — BASIC METABOLIC PANEL
Anion gap: 10 (ref 5–15)
BUN: 35 mg/dL — ABNORMAL HIGH (ref 8–23)
CO2: 32 mmol/L (ref 22–32)
Calcium: 8.4 mg/dL — ABNORMAL LOW (ref 8.9–10.3)
Chloride: 95 mmol/L — ABNORMAL LOW (ref 98–111)
Creatinine, Ser: 0.96 mg/dL (ref 0.44–1.00)
GFR calc Af Amer: >60 mL/min (ref >60)
GFR calc non Af Amer: 53 mL/min — ABNORMAL LOW (ref >60)
Glucose, Bld: 95 mg/dL (ref 70–99)
Potassium: 2.9 mmol/L — ABNORMAL LOW (ref 3.5–5.1)
Sodium: 137 mmol/L (ref 135–145)

## 2019-08-10 LAB — FERRITIN: Ferritin: 150 ng/mL (ref 11–307)

## 2019-08-10 LAB — IRON AND TIBC
Iron: 34 ug/dL (ref 28–170)
Saturation Ratios: 10 % — ABNORMAL LOW (ref 10.4–31.8)
TIBC: 333 ug/dL (ref 250–450)
UIBC: 299 ug/dL

## 2019-08-10 LAB — MAGNESIUM: Magnesium: 1.9 mg/dL (ref 1.7–2.4)

## 2019-08-10 MED ORDER — MAGNESIUM HYDROXIDE 400 MG/5ML PO SUSP: 15.0000 mL | Freq: Every day | ORAL | Status: DC

## 2019-08-10 MED ORDER — METOPROLOL TARTRATE 25 MG PO TABS: 12.5000 mg | ORAL_TABLET | Freq: Two times a day (BID) | ORAL | Status: AC

## 2019-08-10 MED ORDER — IPRATROPIUM-ALBUTEROL 0.5-2.5 (3) MG/3ML IN SOLN: 3.0000 mL | Freq: Four times a day (QID) | RESPIRATORY_TRACT | Status: AC | PRN

## 2019-08-10 MED ORDER — BUMETANIDE 1 MG PO TABS: 1.0000 mg | ORAL_TABLET | Freq: Two times a day (BID) | ORAL | Status: AC

## 2019-08-10 MED ORDER — ONDANSETRON HCL 4 MG PO TABS: 4.0000 mg | ORAL_TABLET | Freq: Four times a day (QID) | ORAL | 0 refills | Status: AC | PRN

## 2019-08-10 MED ORDER — HYDROXYZINE HCL 25 MG PO TABS: 25.0000 mg | ORAL_TABLET | Freq: Two times a day (BID) | ORAL | Status: AC | PRN

## 2019-08-10 MED ORDER — POTASSIUM CHLORIDE CRYS ER 20 MEQ PO TBCR
40.0000 meq | EXTENDED_RELEASE_TABLET | ORAL | Status: AC
  Administered 2019-08-10 (×2): 40 meq via ORAL
  Filled 2019-08-10 (×2): qty 2

## 2019-08-10 MED ORDER — POTASSIUM CHLORIDE 20 MEQ PO PACK
40.0000 meq | PACK | Freq: Once | ORAL | Status: AC
  Administered 2019-08-10: 40 meq via ORAL
  Filled 2019-08-10: qty 2

## 2019-08-10 MED ORDER — POTASSIUM CHLORIDE CRYS ER 20 MEQ PO TBCR: 40.0000 meq | EXTENDED_RELEASE_TABLET | Freq: Two times a day (BID) | ORAL | Status: DC

## 2019-08-10 MED ORDER — TRAZODONE HCL 50 MG PO TABS: 25.0000 mg | ORAL_TABLET | Freq: Every evening | ORAL | Status: AC | PRN

## 2019-08-10 MED ORDER — SODIUM CHLORIDE 0.9 % IV SOLN
200.0000 mg | Freq: Every day | INTRAVENOUS | Status: DC
  Administered 2019-08-10: 200 mg via INTRAVENOUS
  Filled 2019-08-10 (×2): qty 10

## 2019-08-10 MED ORDER — POTASSIUM CHLORIDE CRYS ER 10 MEQ PO TBCR: 20.0000 meq | EXTENDED_RELEASE_TABLET | Freq: Two times a day (BID) | ORAL | Status: AC

## 2019-08-10 MED ORDER — POLYETHYLENE GLYCOL 3350 17 G PO PACK: 17.0000 g | PACK | Freq: Every day | ORAL | 0 refills | Status: AC

## 2019-08-10 MED ORDER — POTASSIUM CHLORIDE 10 MEQ/100ML IV SOLN
10.0000 meq | INTRAVENOUS | Status: AC
  Administered 2019-08-10 (×3): 10 meq via INTRAVENOUS
  Filled 2019-08-10 (×3): qty 100

## 2019-08-10 NOTE — Progress Notes (Addendum)
Physical Therapy Treatment Patient Details Name: Kimberly Wang MRN: [id number] DOB: 1930/09/24 Today's Date: 08/10/2019    History of Present Illness Patient is an 86 year old female who presented to the emergency room with acute onset of altered mental status with generalized weakness and confusion and significant diminish p.o. intake, vomiting. Chest x-ray showed bilateral small pleural effusions with mild cardiomegaly. Found to have spesis/UTI. PMH includes Parkinson's dementia, CAD, HTN, MI.     PT Comments    Pt was seated in recliner upon arriving. She is pleasantly confused and reports feeling hungry throughout. HR 92 bpm and sao2 96% while at rest. HR did elevated to 120s during ambulation but resolves to 80s with seated rest. She refused further ambulation then to doorway and back. " I don't want to miss lunch." She required CGA to stand and during ambulation. Poor safety awareness throughout. O2 maintained > 90% on rm air. She will benefit from continued skilled PT at DC to address deficits with strength,endurance, and balance. She was seated in recliner at conclusion of session with call bell in reach and chair alarm place.      Follow Up Recommendations  SNF;Supervision/Assistance - 24 hour;Supervision for mobility/OOB     Equipment Recommendations  Rolling walker with 5" wheels    Recommendations for Other Services       Precautions / Restrictions Precautions Precautions: Fall Restrictions Weight Bearing Restrictions: No    Mobility  Bed Mobility               General bed mobility comments: pt in recliner pre/post session  Transfers Overall transfer level: Needs assistance Equipment used: Rolling walker (2 wheeled) Transfers: Sit to/from Stand Sit to Stand: Min guard         General transfer comment: pt was able to STS from recliner to/from rw with CGA only. demonstarted improved eccentric control with stand to sit.   Ambulation/Gait Ambulation/Gait  assistance: Min guard Gait Distance (Feet): 25 Feet Assistive device: Rolling walker (2 wheeled) Gait Pattern/deviations: Step-through pattern;Trunk flexed Gait velocity: decreased    General Gait Details: pt ambulated to doorway of room and back with CGA. sao2 >90 % throughout. HR elevated to 120s but also drops to 80s quickly. A fib?   Stairs             Wheelchair Mobility    Modified Rankin (Stroke Patients Only)       Balance                                            Cognition Arousal/Alertness: Awake/alert Behavior During Therapy: WFL for tasks assessed/performed Overall Cognitive Status: History of cognitive impairments - at baseline                                 General Comments: Pt pleasantly confused, repeats questions and stories at times, perseverates on wanting to drink even after educating pt on fluid restrictions multiple times      Exercises      General Comments        Pertinent Vitals/Pain Pain Assessment: No/denies pain    Home Living                      Prior Function  PT Goals (current goals can now be found in the care plan section) Acute Rehab PT Goals Patient Stated Goal: none stated Progress towards PT goals: Progressing toward goals    Frequency    Min 2X/week      PT Plan Current plan remains appropriate    Co-evaluation              AM-PAC PT "6 Clicks" Mobility   Outcome Measure  Help needed turning from your back to your side while in a flat bed without using bedrails?: A Little Help needed moving from lying on your back to sitting on the side of a flat bed without using bedrails?: A Little Help needed moving to and from a bed to a chair (including a wheelchair)?: A Little Help needed standing up from a chair using your arms (e.g., wheelchair or bedside chair)?: A Little Help needed to walk in hospital room?: A Little Help needed climbing 3-5 steps  with a railing? : A Lot 6 Click Score: 17    End of Session         PT Visit Diagnosis: Unsteadiness on feet (R26.81);Muscle weakness (generalized) (M62.81);Difficulty in walking, not elsewhere classified (R26.2)     Time: 1856-3149 PT Time Calculation (min) (ACUTE ONLY): 22 min  Charges:  $Therapeutic Activity: 8-22 mins                    Julaine Fusi PTA 08/10/19, 11:51 AM

## 2019-08-10 NOTE — Telephone Encounter (Signed)
Please schedule.  She did get 1 dose of Venofer on 08/03/19 during admission

## 2019-08-10 NOTE — Discharge Summary (Addendum)
Physician Discharge Summary  Kimberly Wang ZOX:[id number] DOB: 1930-06-19 DOA: 07/22/2019  PCP: [name], MD  Admit date: 07/22/2019 Discharge date: 08/10/2019 Consultations: Hematology Oncology , Cardiology Dr Lady Gary, Gastroenterology Dr Norville Haggard Admitted From: home Disposition: SNF  Discharge Diagnoses:  Principal Problem:   Sepsis due to gram-negative UTI [facility]) Active Problems:   Acute on chronic diastolic CHF (congestive heart failure) (HCC)   Coagulation defect (HCC)   Dementia due to Parkinson's disease without behavioral disturbance (HCC)   Benign essential HTN   CAD (coronary artery disease)   Anemia   A-fib (HCC)   Hypokalemia   Hyponatremia   Iron deficiency anemia due to chronic blood loss   Hospital Course Summary: Admit date:07/22/2019 86 y.o.F with h/o CAD, HTNand dementia, presented to the emergency room with acute onset of AMS,generalized weakness, concern for poor oral intake and vomiting 1 associated with low grade fever. ED-Course:Temp100.2 and heart rate 101 with otherwise normal vital signs. CMP revealed hyponatremia and hypokalemia as well as hypochloremia with blood glucose of 223, BUN of 18 and creatinine 1.14, albumin of 3.4. Lactic acid was 3.8 and later 3.7. High-sensitivity troponin I was 60. CBC showed leukocytosis of 25.1 with neutrophilia. Urinalysis showed 6-10 WBCs with positive nitrite and trace leukocytes. Blood cultures were drawn and urine culture was sent. Abdominal and pelvic CT scan revealed paraesophageal hernia with no acute findings. Chest x-ray showed bilateral small pleural effusions with mild cardiomegaly.Patient received IV fluids,IV cefepime and a gram of IV vancomycin. Hospital course: Patient admitted to [facility] forfurther evaluation and management of UTI with sepsis. HC complicated by worsening anemia requiring PRBC transfusion and EGD on 6/8, volume overload requiring IV diuresis and A fib with RVR. Prompting cardiology  evaluation. Also evaluated by Hematology for leucocytosis/anemia  Severe sepsis secondary to E. coli urinary tract infection:Present on admission. Patient with leukocytosis, tachycardia, tachypnea, elevated lactic acid and AMS on admission.Patienthascompleted antibiotic course. Afebrile now although has persistent/fluctuating leucocytosis. No blasts or fragmentedcells on peripheral smear per pathology review. Likely reactive per hematology-down to 11.9k  now. BM biopsy deferred.  Acute on chronic diastolic heart failure:patient presented with sepsis and was given IV fluids,currently significantly volume overloaded ,admission weight 160 pounds-> 178 pounds. Cards changedoral Lasix back to IV Lasix over the weekend--increased frequency to twice daily given 3+ leg edema over the last couple of days with no significant improvement but adding Zaroxolyn x1 dose along with fluid restriction appears to be helping.  Weight today down to 166 pounds.  Changed Lasix to Bumex for maintenance dosing with potassium supplementation in view of inadequate response to Lasix, continue to monitor intake and output, daily weights.  (She was not responding to p.o. Lasix 80 mg twice daily at home and states had persistent leg swellings).  Chest x-ray showed bibasilar atelectasis with small but no significant pleural effusions.  She maintained O2 sats on walking desaturation study but got tachypneic and tachycardic 2 days prior to discharge, metoprolol added by cardiology and did much better today on walking with PT.Marland Kitchen  She will be discharged on p.o. Bumex along with potassium supplementation.  Weights need to be monitored closely at the nursing facility.  Severe hypokalemia: In the setting of diuresis. Replaced aggressively today-IV and PO.  Potassium 3.1 this afternoon, added extra dose of 40 mEq.  Also add daily supplements with scheduled diuretic.   Should have BMP checked at skilled nursing facility in a.m.  (Social  worker has communicated with them)  Acute metabolic encephalopathywith underlyingdementia-secondary to  acute infection-Continue Aricept, memantine, Seroquel -Minimize sedatives.  Now resolved and at baseline.  Oriented x3 and quite communicative.  Normocytic anemia, unclear etiology:Was on Eliquis prior to admission as well as oral iron whichare currentlyheld.Anemia panel showed iron 30, ferritin 55, folate 14.8, vitamin B12 440-iron panel isnottypical for iron deficiencyper d/w hematology, Dr Cathie Hoops. LDH normal. Increased reticulocytosis can be seen acute GI bleed or hemolysis. T bilirubin is normal, less likely hemolysis.Hemoglobinnadir at 6.6, received one unitPRBC on 6/11. Patient is s/pEGDshowing largehiatalhernia, normal otherwise. Patientnot compliant with colonoscopy prep,GI recommend outpatient colonoscopy and signed off,Eliquisremains on hold. FOBT not done. CT abd /pelvis 07/23/19-- showed mitral valve calcification, moderate paraesophageal hernia , diverticulosis but no evidence of retroperitoneal hematoma or hepatosplenomegaly.  Fecal occult blood test ordered but sent as no BM .She needs to follow up with hematology who have ordered one dose of IV iron today and intend to repeat second dose in 1 week.  She may resume chronic iron supplementation.  Will order laxatives.  Atrial fibrillation with RVR--home med Coreg held.Cardiology recommended continuing diltiazem and to holdEliquis in concern for anemia of unclear source/etiology. Ptstarted to have tachycardia on 6/13 pm lopressoradded,follow finalcardiology recommendations  Elevated troponin/demand ischemia-With history of CAD status post PCI in 2006 Relatively flat,althoughpeaked to 64. Nochestpain and inconsistent with ACS. Suspect demand ischemia.Continue statin  Essential hypertension-Continue Cardizem, now added on metoprolol and Bumex.  Resume Aldactone.   Dyslipidemia-LDL 38, continue statin    Elevated BUN: sec to GIB vs dehydration/prerenal. BUN50 on presentation, home med aldactone held, nownormalized--monitor with diuresis.  Hyponatremia:Resolved.Sodium 130 on presentation-Suspect multifactorial including volume depletion, SSRI,poor oral intake.Patient was on IV fluids, now off and getting diuresis.Zoloft initially held, then resumed.Continue Megace.Continue to monitor BMP periodically but hyponatremia has resolved on last couple of BMP checks.  Hypomagnesemia//hypophosphatemia-resolved with replacement, continue to monitor and replace as needed  Deconditioning/generalized weakness-PT and OT recommending SNF and has a bed available today.     Discharge Exam:   Vitals:   08/09/19 2300 08/10/19 0612 08/10/19 0728 08/10/19 1145  BP:  125/73 113/63 112/75  Pulse:  (!) 108 97 (!) 102  Resp: Temp:  98.4 F (36.9 C) 97.9 F (36.6 C) 97.9 F (36.6 C)  TempSrc:      SpO2:  99% 94% 95%  Weight:  76.1 kg    Height:        General: Pt is alert, awake, not in acute distress Cardiovascular: RRR, S1/S2 +, no rubs, no gallops Respiratory: CTA bilaterally, no wheezing, no rhonchi Abdominal: Soft, NT, ND, bowel sounds + Extremities: 1-2+ pitting edema, no cyanosis  Discharge Condition:Stable CODE STATUS: Full code Diet recommendation: Low-salt, heart healthy diet Recommendations for Outpatient Follow-up:  1. Follow up with PCP: At skilled nursing facility in 2 days 2. Follow up with consultants: Hematology clinic in 1 week for iron infusion, cardiology Dr. Lady Gary in 2 weeks, GI clinic in 2 weeks for colonoscopy evaluation. 3. Please obtain follow up labs including: BMP in a.m. and then periodically as appropriate  Home Health services upon discharge:  Equipment/Devices upon discharge: Walker recommended by PT   Discharge Instructions:  Discharge Instructions    (HEART FAILURE PATIENTS) Call MD:  Anytime you have any of the following symptoms: 1)  3 pound weight gain in 24 hours or 5 pounds in 1 week 2) shortness of breath, with or without a dry hacking cough 3) swelling in the hands, feet or stomach 4) if you have to sleep  on extra pillows at night in order to breathe.   Complete by: As directed    Call MD for:  difficulty breathing, headache or visual disturbances   Complete by: As directed    Call MD for:  persistant dizziness or light-headedness   Complete by: As directed    Call MD for:  persistant nausea and vomiting   Complete by: As directed    Call MD for:  severe uncontrolled pain   Complete by: As directed    Call MD for:  temperature >100.4   Complete by: As directed    Diet - low sodium heart healthy   Complete by: As directed    Increase activity slowly   Complete by: As directed      Allergies as of 08/10/2019      Reactions   Latex Hives, Rash   Lidocaine Hcl Other (See Comments)   Other Reaction: Other reaction. Patient can't take Novacaine   Codeine Other (See Comments)   Morphine Other (See Comments)   Novocain [procaine]    Heart races   Other Other (See Comments)   Uncoded Allergy. Allergen: RED PEPPER      Medication List    STOP taking these medications   carvedilol 6.25 MG tablet Commonly known as: COREG   Eliquis 5 MG Tabs tablet Generic drug: apixaban   furosemide 80 MG tablet Commonly known as: LASIX     TAKE these medications   atorvastatin 80 MG tablet Commonly known as: LIPITOR Take 80 mg by mouth daily.   bumetanide 1 MG tablet Commonly known as: BUMEX Take 1 tablet (1 mg total) by mouth 2 (two) times daily.   diltiazem 240 MG 24 hr capsule Commonly known as: CARDIZEM CD Take 240 mg by mouth daily.   donepezil 10 MG tablet Commonly known as: ARICEPT Take 10 mg by mouth daily.   FeroSul 325 (65 FE) MG tablet Generic drug: ferrous sulfate Take 325 mg by mouth daily with breakfast.   hydrOXYzine 25 MG tablet Commonly known as: ATARAX/VISTARIL Take 1 tablet (25 mg  total) by mouth 2 (two) times daily as needed for anxiety.   ipratropium-albuterol 0.5-2.5 (3) MG/3ML Soln Commonly known as: DUONEB Take 3 mLs by nebulization every 6 (six) hours as needed.   memantine 10 MG tablet Commonly known as: NAMENDA Take 10 mg by mouth 2 (two) times daily.   metoprolol tartrate 25 MG tablet Commonly known as: LOPRESSOR Take 0.5 tablets (12.5 mg total) by mouth 2 (two) times daily.   multivitamin capsule Take by mouth.   nitroGLYCERIN 0.4 MG SL tablet Commonly known as: NITROSTAT Place 0.4 mg under the tongue every 5 (five) minutes as needed for chest pain.   ondansetron 4 MG tablet Commonly known as: ZOFRAN Take 1 tablet (4 mg total) by mouth every 6 (six) hours as needed for nausea.   polyethylene glycol 17 g packet Commonly known as: MiraLax Take 17 g by mouth daily.   potassium chloride 10 MEQ tablet Commonly known as: KLOR-CON Take 2 tablets (20 mEq total) by mouth 2 (two) times daily. What changed:   how much to take  when to take this   sertraline 50 MG tablet Commonly known as: ZOLOFT Take 50 mg by mouth in the morning and at bedtime.   spironolactone 25 MG tablet Commonly known as: ALDACTONE Take 25 mg by mouth daily.   traZODone 50 MG tablet Commonly known as: DESYREL Take 0.5 tablets (25 mg total) by mouth at  bedtime as needed for sleep.       Contact information for follow-up providers    Povsic, Reather Converse, PA-C Follow up in 1 week(s).   Specialty: Cardiology Contact information: [address] [place] [address] [phone]            Contact information for after-discharge care    Destination    [facility] Preferred SNF .   Service: Skilled Nursing Contact information: [address] [place] [address] [phone]                 Allergies  Allergen Reactions   Latex Hives and Rash   Lidocaine Hcl Other (See Comments)    Other Reaction: Other reaction.  Patient can't take Novacaine   Codeine Other (See Comments)   Morphine Other (See Comments)   Novocain [Procaine]     Heart races   Other Other (See Comments)    Uncoded Allergy. Allergen: RED PEPPER      The results of significant diagnostics from this hospitalization (including imaging, microbiology, ancillary and laboratory) are listed below for reference.    Labs: BNP (last 3 results) Recent Labs    07/26/19 0443 07/27/19 0545 08/01/19 0643  BNP 125.1* 92.6 310.2*   Basic Metabolic Panel: Recent Labs  Lab 08/05/19 0457 08/05/19 0457 08/06/19 0639 08/07/19 0525 08/07/19 0811 08/07/19 0811 08/08/19 0446 08/08/19 1701 08/09/19 0446 08/10/19 0558 08/10/19 1212  NA 134*  --   --   --  133*  --  137  --  138 137  --   K 3.6   < >  --   --  4.2   < > 3.0* 3.1* 2.9* 2.9* 3.1*  CL 102  --   --   --  100  --  99  --  94* 95*  --   CO2 22  --   --   --  24  --  29  --  32 32  --   GLUCOSE 102*  --   --   --  90  --  97  --  110* 95  --   BUN 18  --   --   --  26*  --  24*  --  30* 35*  --   CREATININE 0.95  --   --   --  0.97  --  0.94  --  1.04* 0.96  --   CALCIUM 7.7*  --   --   --  8.1*  --  8.2*  --  8.5* 8.4*  --   MG 2.3   < > 2.3 2.3  --   --  2.0  --  1.9 1.9  --    < > = values in this interval not displayed.   Liver Function Tests: No results for input(s): AST, ALT, ALKPHOS, BILITOT, PROT, ALBUMIN in the last 168 hours. No results for input(s): LIPASE, AMYLASE in the last 168 hours. No results for input(s): AMMONIA in the last 168 hours. CBC: Recent Labs  Lab 08/06/19 0639 08/07/19 0525 08/08/19 0446 08/09/19 0446 08/10/19 0558  WBC 19.9* 11.9* 8.9 8.3 8.7  NEUTROABS 17.6* 10.0* 6.8 6.0 6.6  HGB 8.8* 8.2* 8.8* 9.0* 8.6*  HCT 26.8* 26.2* 27.7* 28.8* 26.4*  MCV 92.4 94.9 93.0 92.9 89.5  PLT 334 301 312 319 298   Cardiac Enzymes: No results for input(s): CKTOTAL, CKMB, CKMBINDEX, TROPONINI in the last 168 hours. BNP: Invalid input(s):  POCBNP CBG:  Recent Labs  Lab 08/05/19 2150  GLUCAP 100*   D-Dimer No results for input(s): DDIMER in the last 72 hours. Hgb A1c No results for input(s): HGBA1C in the last 72 hours. Lipid Profile No results for input(s): CHOL, HDL, LDLCALC, TRIG, CHOLHDL, LDLDIRECT in the last 72 hours. Thyroid function studies No results for input(s): TSH, T4TOTAL, T3FREE, THYROIDAB in the last 72 hours.  Invalid input(s): FREET3 Anemia work up Recent Labs    08/10/19 0558 08/10/19 1212  FERRITIN  --  150  TIBC  --  333  IRON  --  34  RETICCTPCT 5.7*  --    Urinalysis    Component Value Date/Time   COLORURINE YELLOW (A) 07/22/2019 0054   APPEARANCEUR HAZY (A) 07/22/2019 0054   LABSPEC 1.012 07/22/2019 0054   PHURINE 5.0 07/22/2019 0054   GLUCOSEU NEGATIVE 07/22/2019 0054   HGBUR MODERATE (A) 07/22/2019 0054   BILIRUBINUR NEGATIVE 07/22/2019 0054   KETONESUR NEGATIVE 07/22/2019 0054   PROTEINUR NEGATIVE 07/22/2019 0054   NITRITE POSITIVE (A) 07/22/2019 0054   LEUKOCYTESUR TRACE (A) 07/22/2019 0054   Sepsis Labs Invalid input(s): PROCALCITONIN,  WBC,  LACTICIDVEN Microbiology Recent Results (from the past 240 hour(s))  SARS Coronavirus 2 by RT PCR (hospital order, performed in [facility] hospital lab) Nasopharyngeal Nasopharyngeal Swab     Status: None   Collection Time: 08/08/19  5:36 PM   Specimen: Nasopharyngeal Swab  Result Value Ref Range Status   SARS Coronavirus 2 NEGATIVE NEGATIVE Final    Comment: (NOTE) SARS-CoV-2 target nucleic acids are NOT DETECTED.  The SARS-CoV-2 RNA is generally detectable in upper and lower respiratory specimens during the acute phase of infection. The lowest concentration of SARS-CoV-2 viral copies this assay can detect is 250 copies / mL. A negative result does not preclude SARS-CoV-2 infection and should not be used as the sole basis for treatment or other patient management decisions.  A negative result may occur with improper specimen  collection / handling, submission of specimen other than nasopharyngeal swab, presence of viral mutation(s) within the areas targeted by this assay, and inadequate number of viral copies (<250 copies / mL). A negative result must be combined with clinical observations, patient history, and epidemiological information.  Fact Sheet for Patients:   BoilerBrush.com.cy  Fact Sheet for Healthcare Providers: https://pope.com/  This test is not yet approved or  cleared by the [place] FDA and has been authorized for detection and/or diagnosis of SARS-CoV-2 by FDA under an Emergency Use Authorization (EUA).  This EUA will remain in effect (meaning this test can be used) for the duration of the COVID-19 declaration under Section 564(b)(1) of the Act, 21 U.S.C. section 360bbb-3(b)(1), unless the authorization is terminated or revoked sooner.  Performed at [facility], [address] [place], [place] [address]     Procedures/Studies: CT ABDOMEN PELVIS WO CONTRAST  Result Date: 07/22/2019 CLINICAL DATA:  Sepsis EXAM: CT ABDOMEN AND PELVIS WITHOUT CONTRAST TECHNIQUE: Multidetector CT imaging of the abdomen and pelvis was performed following the standard protocol without IV contrast. COMPARISON:  None. FINDINGS: Lower chest: There is mild cardiomegaly. Mitral valve calcifications. A moderate paraesophageal hernia containing contrast is seen. Hepatobiliary: Although limited due to the lack of intravenous contrast, normal in appearance without gross focal abnormality. The patient is status post cholecystectomy. No biliary ductal dilation. Pancreas:  Unremarkable.  No surrounding inflammatory changes. Spleen: Normal in size. Although limited due to the lack of intravenous contrast, normal in appearance. Adrenals/Urinary Tract: Both  adrenal glands appear normal. The kidneys and collecting system appear normal without evidence of urinary tract  calculus or hydronephrosis. Bladder is unremarkable. Stomach/Bowel: The stomach, small bowel, are normal in appearance. There is scattered colonic diverticula without diverticulitis. Vascular/Lymphatic: There are no enlarged abdominal or pelvic lymph nodes. Scattered aortic atherosclerotic calcifications are seen without aneurysmal dilatation. Reproductive: The uterus and adnexa are unremarkable. Other: A small fat and transverse colon containing umbilical hernia seen within the mid abdomen. Musculoskeletal: No acute or significant osseous findings. IMPRESSION: Moderate paraesophageal hernia. Diverticulosis without diverticulitis. No other acute intra-abdominal or pelvic pathology to explain the patient's symptoms. Aortic Atherosclerosis (ICD10-I70.0). Electronically Signed   By: Jonna ClarkBindu  Avutu M.D.   On: 07/22/2019 01:40   DG Chest 1 View  Result Date: 07/27/2019 CLINICAL DATA:  Shortness of breath EXAM: CHEST  1 VIEW COMPARISON:  July 26, 2019 FINDINGS: There is unchanged mild cardiomegaly. There is aortic knob and descending intrathoracic aortic calcifications. There is slight interval improvement in the prominence of the central pulmonary vasculature. No acute osseous abnormality. IMPRESSION: Slight interval improvement in the pulmonary vascular congestion. Electronically Signed   By: Jonna ClarkBindu  Avutu M.D.   On: 07/27/2019 06:04   DG Chest 1 View  Result Date: 07/26/2019 CLINICAL DATA:  Shortness of breath. EXAM: CHEST  1 VIEW COMPARISON:  07/23/2019. FINDINGS: Mediastinum hilar structures normal. Cardiomegaly. Mild bilateral interstitial prominence and small pleural effusions. CHF could present in this fashion. Sliding hiatal hernia cannot be excluded. IMPRESSION: Cardiomegaly. Mild bilateral interstitial prominence and small pleural effusions. CHF could present in this fashion. Electronically Signed   By: Maisie Fushomas  Register   On: 07/26/2019 07:52   DG Chest 1 View  Result Date: 07/23/2019 CLINICAL DATA:   Dyspnea EXAM: CHEST  1 VIEW COMPARISON:  Chest radiograph from one day prior. FINDINGS: Stable cardiomediastinal silhouette with mild cardiomegaly. No pneumothorax. Stable small left pleural effusion. No right pleural effusion. No overt pulmonary edema. Streaky bibasilar lung opacities are similar IMPRESSION: Stable small left pleural effusion. Stable streaky bibasilar lung opacities, favor atelectasis or scarring. Stable mild cardiomegaly without overt pulmonary edema. Electronically Signed   By: Delbert PhenixJason A Poff M.D.   On: 07/23/2019 04:50   DG Chest 1 View  Result Date: 07/21/2019 CLINICAL DATA:  Sepsis. EXAM: CHEST  1 VIEW COMPARISON:  None. FINDINGS: There are small bilateral pleural effusions. There is a left perihilar density of unknown clinical significance. The heart size is mildly enlarged. There are dense aortic calcifications. There are few streaky airspace opacities at the lung bases favored to represent areas of atelectasis. There is no pneumothorax. IMPRESSION: 1. Small bilateral pleural effusions. 2. Mild cardiomegaly. 3. Slightly prominent left perihilar region may be secondary to a dilated pulmonary artery or lymphadenopathy. A follow-up two-view chest x-ray is recommended in 4-6 weeks. Electronically Signed   By: Katherine Mantlehristopher  Green M.D.   On: 07/21/2019 22:34   DG Chest Port 1 View  Result Date: 08/07/2019 CLINICAL DATA:  Dyspnea EXAM: PORTABLE CHEST 1 VIEW COMPARISON:  08/03/2019 FINDINGS: Cardiac shadow is enlarged but stable. Aortic calcifications are noted. Large hiatal hernia is again noted and stable. The lungs are well aerated. Small effusions are again noted and stable. Mild right basilar atelectasis is seen. IMPRESSION: Small effusions and right basilar atelectasis. Electronically Signed   By: Alcide CleverMark  Lukens M.D.   On: 08/07/2019 13:40   DG Chest Port 1 View  Result Date: 08/03/2019 CLINICAL DATA:  Cough EXAM: PORTABLE CHEST 1 VIEW COMPARISON:  07/27/2019 FINDINGS: Cardiac  shadow  remains enlarged. Aortic calcifications are again seen. Small bilateral pleural effusions are noted stable from the prior exam. No focal infiltrate is noted. No acute bony abnormality is seen. IMPRESSION: Small effusions bilaterally.  No acute abnormality noted. Electronically Signed   By: Alcide Clever M.D.   On: 08/03/2019 17:17   DG Chest Port 1 View  Result Date: 07/22/2019 CLINICAL DATA:  Shortness of breath. EXAM: PORTABLE CHEST 1 VIEW COMPARISON:  Jul 21, 2019 FINDINGS: Cardiomegaly. Skin fold over the lateral right chest. The hila and mediastinum are normal. No pneumothorax. No nodules or masses. No focal infiltrates. IMPRESSION: No active disease. Electronically Signed   By: Gerome Sam III M.D   On: 07/22/2019 15:12   ECHOCARDIOGRAM COMPLETE  Result Date: 07/27/2019    ECHOCARDIOGRAM REPORT   Patient Name:   BEVERLY FERNER Date of Exam: 07/26/2019 Medical Rec #:  [id number]   Height:       64.0 in Accession #:    [id number]  Weight:       164.2 lb Date of Birth:  [birth year]    BSA:          1.799 m Patient Age:    86 years    BP:           117/65 mmHg Patient Gender: F           HR:           124 bpm. Exam Location:  [facility] Procedure: 2D Echo, Cardiac Doppler and Color Doppler Indications:     Dyspnea 786.09 / R06.00  History:         Patient has no prior history of Echocardiogram examinations.                  CAD; Risk Factors:Hypertension. MI.  Sonographer:     Neysa Bonito Roar Referring Phys:  [id number] Kateri Mc LATIF [facility] Diagnosing Phys: Lorine Bears MD IMPRESSIONS  1. Left ventricular ejection fraction, by estimation, is 40 to 45%. The left ventricle has mildly decreased function. The left ventricle demonstrates regional wall motion abnormalities with possible anterior/anteroseptal hypokinesis. There is mild left ventricular hypertrophy. Left ventricular diastolic parameters are indeterminate.  2. Right ventricular systolic function is normal. The right ventricular size is normal. There is mildly  elevated pulmonary artery systolic pressure.  3. Left atrial size was severely dilated.  4. Right atrial size was moderately dilated.  5. The mitral valve is abnormal. Moderate mitral valve regurgitation. No evidence of mitral stenosis.  6. The aortic valve is abnormal. Aortic valve regurgitation is moderate. Mild aortic valve sclerosis is present, with no evidence of aortic valve stenosis. FINDINGS  Left Ventricle: Left ventricular ejection fraction, by estimation, is 40 to 45%. The left ventricle has mildly decreased function. The left ventricle demonstrates regional wall motion abnormalities. The left ventricular internal cavity size was normal in size. There is mild left ventricular hypertrophy. Left ventricular diastolic parameters are indeterminate. Right Ventricle: The right ventricular size is normal. No increase in right ventricular wall thickness. Right ventricular systolic function is normal. There is mildly elevated pulmonary artery systolic pressure. The tricuspid regurgitant velocity is 2.69  m/s, and with an assumed right atrial pressure of 10 mmHg, the estimated right ventricular systolic pressure is 38.9 mmHg. Left Atrium: Left atrial size was severely dilated. Right Atrium: Right atrial size was moderately dilated. Pericardium: There is no evidence of pericardial effusion. Mitral Valve: The mitral valve is abnormal. There is moderate thickening of the mitral valve  leaflet(s). There is mild calcification of the mitral valve leaflet(s). Normal mobility of the mitral valve leaflets. Moderate mitral annular calcification. Moderate mitral valve regurgitation. No evidence of mitral valve stenosis. Tricuspid Valve: The tricuspid valve is normal in structure. Tricuspid valve regurgitation is not demonstrated. No evidence of tricuspid stenosis. Aortic Valve: The aortic valve is abnormal. Aortic valve regurgitation is moderate. Mild aortic valve sclerosis is present, with no evidence of aortic valve  stenosis. Aortic valve mean gradient measures 7.5 mmHg. Aortic valve peak gradient measures 14.1 mmHg. Aortic valve area, by VTI measures 1.73 cm. Pulmonic Valve: The pulmonic valve was normal in structure. Pulmonic valve regurgitation is not visualized. No evidence of pulmonic stenosis. Aorta: The aortic root is normal in size and structure. Venous: The inferior vena cava was not well visualized. IAS/Shunts: No atrial level shunt detected by color flow Doppler.  LEFT VENTRICLE PLAX 2D LVIDd:         4.47 cm  Diastology LVIDs:         3.12 cm  LV e' lateral:   6.64 cm/s LV PW:         1.14 cm  LV E/e' lateral: 23.8 LV IVS:        1.06 cm  LV e' medial:    8.81 cm/s LVOT diam:     1.70 cm  LV E/e' medial:  17.9 LV SV:         52 LV SV Index:   29 LVOT Area:     2.27 cm  RIGHT VENTRICLE RV Mid diam:    2.29 cm RV S prime:     12.70 cm/s TAPSE (M-mode): 1.6 cm LEFT ATRIUM             Index       RIGHT ATRIUM           Index LA diam:        4.20 cm 2.33 cm/m  RA Area:     19.00 cm LA Vol (A2C):   92.9 ml 51.64 ml/m RA Volume:   44.60 ml  24.79 ml/m LA Vol (A4C):   82.8 ml 46.02 ml/m LA Biplane Vol: 91.4 ml 50.80 ml/m  AORTIC VALVE                    PULMONIC VALVE AV Area (Vmax):    1.54 cm     PV Vmax:        1.10 m/s AV Area (Vmean):   1.56 cm     PV Peak grad:   4.8 mmHg AV Area (VTI):     1.73 cm     RVOT Peak grad: 2 mmHg AV Vmax:           187.50 cm/s AV Vmean:          123.000 cm/s AV VTI:            0.301 m AV Peak Grad:      14.1 mmHg AV Mean Grad:      7.5 mmHg LVOT Vmax:         127.00 cm/s LVOT Vmean:        84.500 cm/s LVOT VTI:          0.229 m LVOT/AV VTI ratio: 0.76  AORTA Ao Root diam: 2.20 cm MITRAL VALVE                TRICUSPID VALVE MV Area (PHT): 3.16 cm     TR Peak grad:  28.9 mmHg MV Decel Time: 240 msec     TR Vmax:        269.00 cm/s MV E velocity: 158.00 cm/s                             SHUNTS                             Systemic VTI:  0.23 m                             Systemic  Diam: 1.70 cm Lorine Bears MD Electronically signed by Lorine Bears MD Signature Date/Time: 07/27/2019/8:13:34 AM    Final     Time coordinating discharge: Over 30 minutes  SIGNED:   Alessandra Bevels, MD  [facility] 08/10/2019, 3:53 PM

## 2019-08-10 NOTE — Telephone Encounter (Signed)
-----   Message from Rickard Patience, MD sent at 08/10/2019  3:29 PM EDT ----- I saw this patient during her admission. She is being discharged to SNF.  Please arrange her to do post hosp follow up, cbc retic panel  MD +/- Venofer in 1 week.  Please place orders for me. Thanks.

## 2019-08-10 NOTE — Progress Notes (Signed)
Pt agitated and stating that she would "rather die than go to a nursing home".  Spoke with family and requested them to come to her bedside to calm pt.  Family asking to delay transfer until AM.  Klonopin PO given to help calm pt.  MD updated.

## 2019-08-10 NOTE — Discharge Instructions (Signed)
Anemia  Anemia is a condition in which you do not have enough red blood cells or hemoglobin. Hemoglobin is a substance in red blood cells that carries oxygen. When you do not have enough red blood cells or hemoglobin (are anemic), your body cannot get enough oxygen and your organs may not work properly. As a result, you may feel very tired or have other problems. What are the causes? Common causes of anemia include:  Excessive bleeding. Anemia can be caused by excessive bleeding inside or outside the body, including bleeding from the intestine or from periods in women.  Poor nutrition.  Long-lasting (chronic) kidney, thyroid, and liver disease.  Bone marrow disorders.  Cancer and treatments for cancer.  HIV (human immunodeficiency virus) and AIDS (acquired immunodeficiency syndrome).  Treatments for HIV and AIDS.  Spleen problems.  Blood disorders.  Infections, medicines, and autoimmune disorders that destroy red blood cells. What are the signs or symptoms? Symptoms of this condition include:  Minor weakness.  Dizziness.  Headache.  Feeling heartbeats that are irregular or faster than normal (palpitations).  Shortness of breath, especially with exercise.  Paleness.  Cold sensitivity.  Indigestion.  Nausea.  Difficulty sleeping.  Difficulty concentrating. Symptoms may occur suddenly or develop slowly. If your anemia is mild, you may not have symptoms. How is this diagnosed? This condition is diagnosed based on:  Blood tests.  Your medical history.  A physical exam.  Bone marrow biopsy. Your health care provider may also check your stool (feces) for blood and may do additional testing to look for the cause of your bleeding. You may also have other tests, including:  Imaging tests, such as a CT scan or MRI.  Endoscopy.  Colonoscopy. How is this treated? Treatment for this condition depends on the cause. If you continue to lose a lot of blood, you  may need to be treated at a hospital. Treatment may include:  Taking supplements of iron, vitamin T01, or folic acid.  Taking a hormone medicine (erythropoietin) that can help to stimulate red blood cell growth.  Having a blood transfusion. This may be needed if you lose a lot of blood.  Making changes to your diet.  Having surgery to remove your spleen. Follow these instructions at home:  Take over-the-counter and prescription medicines only as told by your health care provider.  Take supplements only as told by your health care provider.  Follow any diet instructions that you were given.  Keep all follow-up visits as told by your health care provider. This is important. Contact a health care provider if:  You develop new bleeding anywhere in the body. Get help right away if:  You are very weak.  You are short of breath.  You have pain in your abdomen or chest.  You are dizzy or feel faint.  You have trouble concentrating.  You have bloody or black, tarry stools.  You vomit repeatedly or you vomit up blood. Summary  Anemia is a condition in which you do not have enough red blood cells or enough of a substance in your red blood cells that carries oxygen (hemoglobin).  Symptoms may occur suddenly or develop slowly.  If your anemia is mild, you may not have symptoms.  This condition is diagnosed with blood tests as well as a medical history and physical exam. Other tests may be needed.  Treatment for this condition depends on the cause of the anemia. This information is not intended to replace advice given to you  your health care provider. Make sure you discuss any questions you have with your health care provider. Document Revised: 01/21/2017 Document Reviewed: 03/12/2016 [employer] Patient Education  2020 [employer]  

## 2019-08-10 NOTE — Telephone Encounter (Signed)
Done.. Appts has been sched as requested Unable to reach pt by phone (no answer) New appts should be on pts discharge AVS. I will mail pt a copy as well

## 2019-08-10 NOTE — Progress Notes (Signed)
Patient vitals were stable and in no distress. [facility] and daughter Amy escorted patient off the unit. Patient appeared happy and waving as she left. Amy thanked the staff for the care her mother in law received. Charge Nurse Salli Quarry notified and aware of patient discharge. All personal belongings returned to patient (Clothes) and Amy carried them off unit in personal belonging bag.

## 2019-08-10 NOTE — Progress Notes (Signed)
Hematology/Oncology Progress Note [facility] [phone] [phone]  Patient Care Team: [name], MD as PCP - General (Family Medicine)   Name of the patient: Kimberly Wang  [id number]  [birth year]  Date of visit: 08/10/19   INTERVAL HISTORY-  Patient sitting in chair.  She has no new complaints.     Review of systems- Review of Systems  Unable to perform ROS: Dementia    Allergies  Allergen Reactions  . Latex Hives and Rash  . Lidocaine Hcl Other (See Comments)    Other Reaction: Other reaction. Patient can't take Novacaine  . Codeine Other (See Comments)  . Morphine Other (See Comments)  . Novocain [Procaine]     Heart races  . Other Other (See Comments)    Uncoded Allergy. Allergen: RED PEPPER    Patient Active Problem List   Diagnosis Date Noted  . A-fib (HCC) 08/01/2019  . Sepsis due to urinary tract infection (HCC)   . Anemia   . Sepsis due to gram-negative UTI (HCC) 07/22/2019  . Severe sepsis (HCC) 07/22/2019  . Dementia due to Parkinson's disease without behavioral disturbance (HCC) 07/22/2019  . Benign essential HTN 07/22/2019  . CAD (coronary artery disease) 07/22/2019  . Pain due to onychomycosis of toenails of both feet 05/17/2019  . Coagulation defect (HCC) 05/17/2019     Past Medical History:  Diagnosis Date  . Coronary artery disease   . Dementia (HCC)   . Hypertension   . Myocardial infarct [facility])      Past Surgical History:  Procedure Laterality Date  . CHOLECYSTECTOMY    . CORONARY STENT PLACEMENT    . ESOPHAGOGASTRODUODENOSCOPY (EGD) WITH PROPOFOL N/A 07/31/2019   Procedure: ESOPHAGOGASTRODUODENOSCOPY (EGD) WITH PROPOFOL;  Surgeon: Toney Reil, MD;  Location: [facility] ENDOSCOPY;  Service: Gastroenterology;  Laterality: N/A;    Social History   Socioeconomic History  . Marital status: Married    Spouse name: Not on file  . Number of children: Not on file  . Years of  education: Not on file  . Highest education level: Not on file  Occupational History  . Not on file  Tobacco Use  . Smoking status: Never Smoker  . Smokeless tobacco: Never Used  Vaping Use  . Vaping Use: Never used  Substance and Sexual Activity  . Alcohol use: Never  . Drug use: Never  . Sexual activity: Not on file  Other Topics Concern  . Not on file  Social History Narrative  . Not on file   Social Determinants of Health   Financial Resource Strain:   . Difficulty of Paying Living Expenses:   Food Insecurity:   . Worried About Programme researcher, broadcasting/film/video in the Last Year:   . Barista in the Last Year:   Transportation Needs:   . Freight forwarder (Medical):   Marland Kitchen Lack of Transportation (Non-Medical):   Physical Activity:   . Days of Exercise per Week:   . Minutes of Exercise per Session:   Stress:   . Feeling of Stress :   Social Connections:   . Frequency of Communication with Friends and Family:   . Frequency of Social Gatherings with Friends and Family:   . Attends Religious Services:   . Active Member of Clubs or Organizations:   . Attends Banker Meetings:   Marland Kitchen Marital Status:   Intimate Partner Violence:   . Fear of Current or Ex-Partner:   . Emotionally Abused:   .  Physically Abused:   . Sexually Abused:      History reviewed. No pertinent family history.   Current Facility-Administered Medications:  .  acetaminophen (TYLENOL) tablet 650 mg, 650 mg, Oral, Q6H PRN, 650 mg at 07/25/19 2125 **OR** acetaminophen (TYLENOL) suppository 650 mg, 650 mg, Rectal, Q6H PRN, Mansy, Jan A, MD .  atorvastatin (LIPITOR) tablet 80 mg, 80 mg, Oral, Daily, Mansy, Jan A, MD, 80 mg at 08/10/19 0735 .  bumetanide (BUMEX) tablet 1 mg, 1 mg, Oral, BID, Alessandra BevelsKamineni, Neelima, MD, 1 mg at 08/10/19 0736 .  clonazePAM (KLONOPIN) disintegrating tablet 0.25 mg, 0.25 mg, Oral, BID PRN, Alessandra BevelsKamineni, Neelima, MD, 0.25 mg at 08/09/19 2225 .  diltiazem (CARDIZEM CD) 24 hr  capsule 240 mg, 240 mg, Oral, Daily, Mansy, Jan A, MD, 240 mg at 08/10/19 0737 .  donepezil (ARICEPT) tablet 10 mg, 10 mg, Oral, QHS, Mansy, Jan A, MD, 10 mg at 08/09/19 2054 .  ipratropium-albuterol (DUONEB) 0.5-2.5 (3) MG/3ML nebulizer solution 3 mL, 3 mL, Nebulization, Q6H PRN, Jimmye Normanuma, Elizabeth Achieng, NP, 3 mL at 08/05/19 1657 .  magnesium hydroxide (MILK OF MAGNESIA) suspension 30 mL, 30 mL, Oral, Daily PRN, Mansy, Jan A, MD .  megestrol (MEGACE) 400 MG/10ML suspension 400 mg, 400 mg, Oral, Daily, Drema DallasWoods, Curtis J, MD, 400 mg at 08/10/19 0737 .  melatonin tablet 5 mg, 5 mg, Oral, Once, Manuela SchwartzMorrison, Brenda, NP .  memantine [facility]NAMENDA) tablet 10 mg, 10 mg, Oral, Daily, Mansy, Jan A, MD, 10 mg at 08/10/19 0738 .  metoprolol tartrate (LOPRESSOR) tablet 12.5 mg, 12.5 mg, Oral, BID, Albertine GratesXu, Fang, MD, 12.5 mg at 08/10/19 0738 .  multivitamin with minerals tablet 1 tablet, 1 tablet, Oral, Daily, Valrie HartHall, Scott A, RPH, 1 tablet at 08/10/19 0739 .  nitroGLYCERIN (NITROSTAT) SL tablet 0.4 mg, 0.4 mg, Sublingual, Q5 min PRN, Mansy, Jan A, MD .  ondansetron (ZOFRAN) tablet 4 mg, 4 mg, Oral, Q6H PRN **OR** ondansetron (ZOFRAN) injection 4 mg, 4 mg, Intravenous, Q6H PRN, Mansy, Jan A, MD, 4 mg at 08/02/19 1948 .  pantoprazole (PROTONIX) injection 40 mg, 40 mg, Intravenous, Q12H, Marguerita MerlesSheikh, Omair WestwegoLatif, DO, 40 mg at 08/10/19 0739 .  QUEtiapine (SEROQUEL) tablet 25 mg, 25 mg, Oral, QHS PRN, Jimmye Normanuma, Elizabeth Achieng, NP, 25 mg at 08/09/19 2056 .  sertraline (ZOLOFT) tablet 50 mg, 50 mg, Oral, QHS, Ronnald Rampatel, Kishan S, RPH, 50 mg at 08/09/19 2056 .  sodium chloride flush (NS) 0.9 % injection 3 mL, 3 mL, Intravenous, Q12H, Kamineni, Neelima, MD, 3 mL at 08/10/19 0740 .  traZODone (DESYREL) tablet 25 mg, 25 mg, Oral, QHS PRN, Mansy, Jan A, MD, 25 mg at 08/09/19 2057   Physical exam:  Vitals:   08/09/19 2300 08/10/19 0612 08/10/19 0728 08/10/19 1145  BP:  125/73 113/63 112/75  Pulse:  (!) 108 97 (!) 102  Resp: 17 20 18 18   Temp:   98.4 F (36.9 C) 97.9 F (36.6 C) 97.9 F (36.6 C)  TempSrc:      SpO2:  99% 94% 95%  Weight:  167 lb 12.8 oz (76.1 kg)    Height:       Physical Exam Constitutional:      General: She is not in acute distress.    Appearance: She is not diaphoretic.  HENT:     Head: Normocephalic and atraumatic.     Nose: Nose normal.     Mouth/Throat:     Pharynx: No oropharyngeal exudate.  Eyes:     General: No scleral  icterus.    Pupils: Pupils are equal, round, and reactive to light.  Cardiovascular:     Rate and Rhythm: Normal rate and regular rhythm.     Heart sounds: No murmur heard.   Pulmonary:     Effort: Pulmonary effort is normal. No respiratory distress.     Breath sounds: No rales.  Chest:     Chest wall: No tenderness.  Abdominal:     General: There is no distension.     Palpations: Abdomen is soft.     Tenderness: There is no abdominal tenderness.  Musculoskeletal:        General: Swelling present. Normal range of motion.     Cervical back: Normal range of motion and neck supple.  Skin:    General: Skin is warm and dry.     Coloration: Skin is pale.     Findings: No erythema.  Neurological:     Mental Status: She is alert and oriented to person, place, and time. Mental status is at baseline.     Cranial Nerves: No cranial nerve deficit.     Motor: No abnormal muscle tone.     Coordination: Coordination normal.     Comments: Pleasantly confused  Psychiatric:        Mood and Affect: Affect normal.        CMP Latest Ref Rng & Units 08/10/2019  Glucose 70 - 99 mg/dL 95  BUN 8 - 23 mg/dL 35(H)  Creatinine 0.44 - 1.00 mg/dL 0.96  Sodium 135 - 145 mmol/L 137  Potassium 3.5 - 5.1 mmol/L 2.9(L)  Chloride 98 - 111 mmol/L 95(L)  CO2 22 - 32 mmol/L 32  Calcium 8.9 - 10.3 mg/dL 8.4(L)  Total Protein 6.5 - 8.1 g/dL -  Total Bilirubin 0.3 - 1.2 mg/dL -  Alkaline Phos 38 - 126 U/L -  AST 15 - 41 U/L -  ALT 0 - 44 U/L -   CBC Latest Ref Rng & Units 08/10/2019  WBC  4.0 - 10.5 K/uL 8.7  Hemoglobin 12.0 - 15.0 g/dL 8.6(L)  Hematocrit 36 - 46 % 26.4(L)  Platelets 150 - 400 K/uL 298    RADIOGRAPHIC STUDIES: I have personally reviewed the radiological images as listed and agreed with the findings in the report. CT ABDOMEN PELVIS WO CONTRAST  Result Date: 07/22/2019 CLINICAL DATA:  Sepsis EXAM: CT ABDOMEN AND PELVIS WITHOUT CONTRAST TECHNIQUE: Multidetector CT imaging of the abdomen and pelvis was performed following the standard protocol without IV contrast. COMPARISON:  None. FINDINGS: Lower chest: There is mild cardiomegaly. Mitral valve calcifications. A moderate paraesophageal hernia containing contrast is seen. Hepatobiliary: Although limited due to the lack of intravenous contrast, normal in appearance without gross focal abnormality. The patient is status post cholecystectomy. No biliary ductal dilation. Pancreas:  Unremarkable.  No surrounding inflammatory changes. Spleen: Normal in size. Although limited due to the lack of intravenous contrast, normal in appearance. Adrenals/Urinary Tract: Both adrenal glands appear normal. The kidneys and collecting system appear normal without evidence of urinary tract calculus or hydronephrosis. Bladder is unremarkable. Stomach/Bowel: The stomach, small bowel, are normal in appearance. There is scattered colonic diverticula without diverticulitis. Vascular/Lymphatic: There are no enlarged abdominal or pelvic lymph nodes. Scattered aortic atherosclerotic calcifications are seen without aneurysmal dilatation. Reproductive: The uterus and adnexa are unremarkable. Other: A small fat and transverse colon containing umbilical hernia seen within the mid abdomen. Musculoskeletal: No acute or significant osseous findings. IMPRESSION: Moderate paraesophageal hernia. Diverticulosis without diverticulitis. No other acute  intra-abdominal or pelvic pathology to explain the patient's symptoms. Aortic Atherosclerosis (ICD10-I70.0).  Electronically Signed   By: Jonna Clark M.D.   On: 07/22/2019 01:40   DG Chest 1 View  Result Date: 07/27/2019 CLINICAL DATA:  Shortness of breath EXAM: CHEST  1 VIEW COMPARISON:  July 26, 2019 FINDINGS: There is unchanged mild cardiomegaly. There is aortic knob and descending intrathoracic aortic calcifications. There is slight interval improvement in the prominence of the central pulmonary vasculature. No acute osseous abnormality. IMPRESSION: Slight interval improvement in the pulmonary vascular congestion. Electronically Signed   By: Jonna Clark M.D.   On: 07/27/2019 06:04   DG Chest 1 View  Result Date: 07/26/2019 CLINICAL DATA:  Shortness of breath. EXAM: CHEST  1 VIEW COMPARISON:  07/23/2019. FINDINGS: Mediastinum hilar structures normal. Cardiomegaly. Mild bilateral interstitial prominence and small pleural effusions. CHF could present in this fashion. Sliding hiatal hernia cannot be excluded. IMPRESSION: Cardiomegaly. Mild bilateral interstitial prominence and small pleural effusions. CHF could present in this fashion. Electronically Signed   By: Maisie Fus  Register   On: 07/26/2019 07:52   DG Chest 1 View  Result Date: 07/23/2019 CLINICAL DATA:  Dyspnea EXAM: CHEST  1 VIEW COMPARISON:  Chest radiograph from one day prior. FINDINGS: Stable cardiomediastinal silhouette with mild cardiomegaly. No pneumothorax. Stable small left pleural effusion. No right pleural effusion. No overt pulmonary edema. Streaky bibasilar lung opacities are similar IMPRESSION: Stable small left pleural effusion. Stable streaky bibasilar lung opacities, favor atelectasis or scarring. Stable mild cardiomegaly without overt pulmonary edema. Electronically Signed   By: Delbert Phenix M.D.   On: 07/23/2019 04:50   DG Chest 1 View  Result Date: 07/21/2019 CLINICAL DATA:  Sepsis. EXAM: CHEST  1 VIEW COMPARISON:  None. FINDINGS: There are small bilateral pleural effusions. There is a left perihilar density of unknown clinical  significance. The heart size is mildly enlarged. There are dense aortic calcifications. There are few streaky airspace opacities at the lung bases favored to represent areas of atelectasis. There is no pneumothorax. IMPRESSION: 1. Small bilateral pleural effusions. 2. Mild cardiomegaly. 3. Slightly prominent left perihilar region may be secondary to a dilated pulmonary artery or lymphadenopathy. A follow-up two-view chest x-ray is recommended in 4-6 weeks. Electronically Signed   By: Katherine Mantle M.D.   On: 07/21/2019 22:34   DG Chest Port 1 View  Result Date: 08/07/2019 CLINICAL DATA:  Dyspnea EXAM: PORTABLE CHEST 1 VIEW COMPARISON:  08/03/2019 FINDINGS: Cardiac shadow is enlarged but stable. Aortic calcifications are noted. Large hiatal hernia is again noted and stable. The lungs are well aerated. Small effusions are again noted and stable. Mild right basilar atelectasis is seen. IMPRESSION: Small effusions and right basilar atelectasis. Electronically Signed   By: Alcide Clever M.D.   On: 08/07/2019 13:40   DG Chest Port 1 View  Result Date: 08/03/2019 CLINICAL DATA:  Cough EXAM: PORTABLE CHEST 1 VIEW COMPARISON:  07/27/2019 FINDINGS: Cardiac shadow remains enlarged. Aortic calcifications are again seen. Small bilateral pleural effusions are noted stable from the prior exam. No focal infiltrate is noted. No acute bony abnormality is seen. IMPRESSION: Small effusions bilaterally.  No acute abnormality noted. Electronically Signed   By: Alcide Clever M.D.   On: 08/03/2019 17:17   DG Chest Port 1 View  Result Date: 07/22/2019 CLINICAL DATA:  Shortness of breath. EXAM: PORTABLE CHEST 1 VIEW COMPARISON:  Jul 21, 2019 FINDINGS: Cardiomegaly. Skin fold over the lateral right chest. The hila and mediastinum are normal. No  pneumothorax. No nodules or masses. No focal infiltrates. IMPRESSION: No active disease. Electronically Signed   By: Gerome Sam III M.D   On: 07/22/2019 15:12   ECHOCARDIOGRAM  COMPLETE  Result Date: 07/27/2019    ECHOCARDIOGRAM REPORT   Patient Name:   Kimberly Wang Date of Exam: 07/26/2019 Medical Rec #:  [id number]   Height:       64.0 in Accession #:    [id number]  Weight:       164.2 lb Date of Birth:  Jan 30, 1931    BSA:          1.799 m Patient Age:    86 years    BP:           117/65 mmHg Patient Gender: F           HR:           124 bpm. Exam Location:  [facility] Procedure: 2D Echo, Cardiac Doppler and Color Doppler Indications:     Dyspnea 786.09 / R06.00  History:         Patient has no prior history of Echocardiogram examinations.                  CAD; Risk Factors:Hypertension. MI.  Sonographer:     Neysa Bonito Roar Referring Phys:  [id number] Kateri Mc LATIF [facility] Diagnosing Phys: Lorine Bears MD IMPRESSIONS  1. Left ventricular ejection fraction, by estimation, is 40 to 45%. The left ventricle has mildly decreased function. The left ventricle demonstrates regional wall motion abnormalities with possible anterior/anteroseptal hypokinesis. There is mild left ventricular hypertrophy. Left ventricular diastolic parameters are indeterminate.  2. Right ventricular systolic function is normal. The right ventricular size is normal. There is mildly elevated pulmonary artery systolic pressure.  3. Left atrial size was severely dilated.  4. Right atrial size was moderately dilated.  5. The mitral valve is abnormal. Moderate mitral valve regurgitation. No evidence of mitral stenosis.  6. The aortic valve is abnormal. Aortic valve regurgitation is moderate. Mild aortic valve sclerosis is present, with no evidence of aortic valve stenosis. FINDINGS  Left Ventricle: Left ventricular ejection fraction, by estimation, is 40 to 45%. The left ventricle has mildly decreased function. The left ventricle demonstrates regional wall motion abnormalities. The left ventricular internal cavity size was normal in size. There is mild left ventricular hypertrophy. Left ventricular diastolic parameters are indeterminate.  Right Ventricle: The right ventricular size is normal. No increase in right ventricular wall thickness. Right ventricular systolic function is normal. There is mildly elevated pulmonary artery systolic pressure. The tricuspid regurgitant velocity is 2.69  m/s, and with an assumed right atrial pressure of 10 mmHg, the estimated right ventricular systolic pressure is 38.9 mmHg. Left Atrium: Left atrial size was severely dilated. Right Atrium: Right atrial size was moderately dilated. Pericardium: There is no evidence of pericardial effusion. Mitral Valve: The mitral valve is abnormal. There is moderate thickening of the mitral valve leaflet(s). There is mild calcification of the mitral valve leaflet(s). Normal mobility of the mitral valve leaflets. Moderate mitral annular calcification. Moderate mitral valve regurgitation. No evidence of mitral valve stenosis. Tricuspid Valve: The tricuspid valve is normal in structure. Tricuspid valve regurgitation is not demonstrated. No evidence of tricuspid stenosis. Aortic Valve: The aortic valve is abnormal. Aortic valve regurgitation is moderate. Mild aortic valve sclerosis is present, with no evidence of aortic valve stenosis. Aortic valve mean gradient measures 7.5 mmHg. Aortic valve peak gradient measures 14.1 mmHg. Aortic valve area, by VTI measures  1.73 cm. Pulmonic Valve: The pulmonic valve was normal in structure. Pulmonic valve regurgitation is not visualized. No evidence of pulmonic stenosis. Aorta: The aortic root is normal in size and structure. Venous: The inferior vena cava was not well visualized. IAS/Shunts: No atrial level shunt detected by color flow Doppler.  LEFT VENTRICLE PLAX 2D LVIDd:         4.47 cm  Diastology LVIDs:         3.12 cm  LV e' lateral:   6.64 cm/s LV PW:         1.14 cm  LV E/e' lateral: 23.8 LV IVS:        1.06 cm  LV e' medial:    8.81 cm/s LVOT diam:     1.70 cm  LV E/e' medial:  17.9 LV SV:         52 LV SV Index:   29 LVOT Area:      2.27 cm  RIGHT VENTRICLE RV Mid diam:    2.29 cm RV S prime:     12.70 cm/s TAPSE (M-mode): 1.6 cm LEFT ATRIUM             Index       RIGHT ATRIUM           Index LA diam:        4.20 cm 2.33 cm/m  RA Area:     19.00 cm LA Vol (A2C):   92.9 ml 51.64 ml/m RA Volume:   44.60 ml  24.79 ml/m LA Vol (A4C):   82.8 ml 46.02 ml/m LA Biplane Vol: 91.4 ml 50.80 ml/m  AORTIC VALVE                    PULMONIC VALVE AV Area (Vmax):    1.54 cm     PV Vmax:        1.10 m/s AV Area (Vmean):   1.56 cm     PV Peak grad:   4.8 mmHg AV Area (VTI):     1.73 cm     RVOT Peak grad: 2 mmHg AV Vmax:           187.50 cm/s AV Vmean:          123.000 cm/s AV VTI:            0.301 m AV Peak Grad:      14.1 mmHg AV Mean Grad:      7.5 mmHg LVOT Vmax:         127.00 cm/s LVOT Vmean:        84.500 cm/s LVOT VTI:          0.229 m LVOT/AV VTI ratio: 0.76  AORTA Ao Root diam: 2.20 cm MITRAL VALVE                TRICUSPID VALVE MV Area (PHT): 3.16 cm     TR Peak grad:   28.9 mmHg MV Decel Time: 240 msec     TR Vmax:        269.00 cm/s MV E velocity: 158.00 cm/s                             SHUNTS                             Systemic VTI:  0.23 m  Systemic Diam: 1.70 cm Lorine Bears MD Electronically signed by Lorine Bears MD Signature Date/Time: 07/27/2019/8:13:34 AM    Final     Assessment and plan-   #Acute onset of anemia, Likely due to GI blood loss, Patient's hemoglobin has been monitored daily.  Hemoglobin has trended up to 9 and now started to trend down again. Eliquis has been discontinued due to high bleeding risk. Colonoscopy not able to be done as patient does not cooperate with bowel prep. I will repeat iron panel and reticulocyte panel.  Labs reviewed.  Patient has decreased reticulocyte hemoglobin level, decreased iron saturation.  I recommend her to get additional IV Venofer treatments.  Plan 2 doses. Ordered  Leukocytosis, normalized.  Secondary to infection.  Flow cytometry showed  very trace population of monoclonal lymphocytes, not meeting diagnostic criteria for CLL.. CHF/A. fib management per cardiology team. Her mental status secondary to dementia, seems to be at baseline. Hypokalemia, replacement per primary team.  Plan was discussed with hospitalist. Beryle Lathe you for allowing me to participate in the care of this patient.   Rickard Patience, MD, PhD Hematology Oncology [facility] Pager- [phone] 08/10/2019

## 2019-08-14 ENCOUNTER — Other Ambulatory Visit: Payer: Self-pay | Admitting: Oncology

## 2019-08-14 DIAGNOSIS — D509 Iron deficiency anemia, unspecified: Secondary | ICD-10-CM

## 2019-08-16 ENCOUNTER — Telehealth: Payer: Self-pay | Admitting: *Deleted

## 2019-08-16 NOTE — Telephone Encounter (Addendum)
I called and spoke with daughter and discussed Dr Bethanne Ginger response and she has agreed to bring patient to appointment tomorrow. The facility will help get her into her car and she will need assistance getting her out when she comes to office. She will have her there at 1015 in case Dr Cathie Hoops wants the retic count drawn. Please advise about need for lab and cancel the CBC ordered for tomorrow

## 2019-08-16 NOTE — Telephone Encounter (Signed)
Her hemoglobin is still low.  I recommend patient to keep her appointments.  If she still wants to move her appointment out, okay with me at her discretion.

## 2019-08-16 NOTE — Telephone Encounter (Signed)
Daughter Amy called reporting that patient labs drawn at [facility] today and that we should look in My Chart for results and if her HGB is good enough, her appointment should be cancelled as it is difficult to bring patient out for appointments. Please advise and if no need for transfusion, call daughter back and let her know if appointment can be moved out and when.  Complete Blood Count (CBC) with Differential Specimen:  Blood  Ref Range & Units Today Comments  WBC (White Blood Cell Count) 3.2 - 9.8 x10^9/L 16.3High        Hemoglobin 12.0 - 15.5 g/dL 3.6UYQ        Hematocrit 35.0 - 45.0 % 27.9Low        Platelets 150 - 450 x10^9/L 345        MCV (Mean Corpuscular Volume) 80 - 98 fL 96        MCH (Mean Corpuscular Hemoglobin) 26.5 - 34.0 pg 28.9        MCHC (Mean Corpuscular Hemoglobin Concentration) 31.4 - 36.0 % 30.1Low        RBC (Red Blood Cell Count) 3.77 - 5.16 x10^12/L 2.91Low        RDW-CV (Red Cell Distribution Width) 11.5 - 14.5 % 19.5High        MPV (Mean Platelet Volume) 7.2 - 11.7 fL 10.0        Neutrophil Count 2.0 - 8.6 x10^9/L 14.7High        Neutrophil % 37 - 80 % 90.2High        Lymphocyte Count 0.6 - 4.2 x10^9/L 0.8        Lymphocyte % 10 - 50 % 4.9Low        Monocyte Count 0 - 0.9 x10^9/L 0.6        Monocyte % 0 - 12 % 3.9        Eosinophil Count 0 - 0.70 x10^9/L 0.03        Eosinophil % 0 - 7 % 0.2        Basophil Count 0 - 0.20 x10^9/L 0.05        Basophil % 0 - 2 % 0.3        Slide Review/Morphology  Yes  Blood film reviewed, instrument counts confirmed,Large platelets,      Immature Granulocyte Count <=0.06 x10^9/L 0.08High        Immature Granulocyte % <=0.7 % 0.5        Resulting Agency  [facility]   Specimen Collected: 08/16/19 12:26 PM Last Resulted: 08/16/19 1:07 PM  Received From: [facility]  Result Received: 08/16/19 2:50 PM

## 2019-08-16 NOTE — Telephone Encounter (Addendum)
Pt scheduled for venofer and will need venofer, per Dr. Cathie Hoops.

## 2019-08-16 NOTE — Telephone Encounter (Signed)
Dr. Cathie Hoops still wants CBC and retic panel drawn tomorrow. Will keep lab appts as is.

## 2019-08-17 ENCOUNTER — Other Ambulatory Visit: Payer: Self-pay

## 2019-08-17 ENCOUNTER — Encounter: Payer: Self-pay | Admitting: Oncology

## 2019-08-17 ENCOUNTER — Encounter (INDEPENDENT_AMBULATORY_CARE_PROVIDER_SITE_OTHER): Payer: Self-pay

## 2019-08-17 ENCOUNTER — Inpatient Hospital Stay: Payer: Medicare PPO

## 2019-08-17 ENCOUNTER — Inpatient Hospital Stay: Payer: Medicare PPO | Attending: Oncology | Admitting: Oncology

## 2019-08-17 VITALS — BP 117/67 | HR 83 | Temp 97.0°F | Resp 20 | Ht 62.0 in | Wt 177.4 lb

## 2019-08-17 DIAGNOSIS — I2510 Atherosclerotic heart disease of native coronary artery without angina pectoris: Secondary | ICD-10-CM | POA: Diagnosis not present

## 2019-08-17 DIAGNOSIS — I252 Old myocardial infarction: Secondary | ICD-10-CM | POA: Diagnosis not present

## 2019-08-17 DIAGNOSIS — I4891 Unspecified atrial fibrillation: Secondary | ICD-10-CM | POA: Insufficient documentation

## 2019-08-17 DIAGNOSIS — D500 Iron deficiency anemia secondary to blood loss (chronic): Secondary | ICD-10-CM | POA: Diagnosis present

## 2019-08-17 DIAGNOSIS — R600 Localized edema: Secondary | ICD-10-CM | POA: Diagnosis not present

## 2019-08-17 DIAGNOSIS — M7989 Other specified soft tissue disorders: Secondary | ICD-10-CM | POA: Insufficient documentation

## 2019-08-17 DIAGNOSIS — Z79899 Other long term (current) drug therapy: Secondary | ICD-10-CM | POA: Insufficient documentation

## 2019-08-17 DIAGNOSIS — I110 Hypertensive heart disease with heart failure: Secondary | ICD-10-CM | POA: Diagnosis not present

## 2019-08-17 DIAGNOSIS — R062 Wheezing: Secondary | ICD-10-CM | POA: Diagnosis not present

## 2019-08-17 DIAGNOSIS — D649 Anemia, unspecified: Secondary | ICD-10-CM

## 2019-08-17 LAB — CBC WITH DIFFERENTIAL/PLATELET
Abs Immature Granulocytes: 0.2 10*3/uL — ABNORMAL HIGH (ref 0.00–0.07)
Basophils Absolute: 0.1 10*3/uL (ref 0.0–0.1)
Basophils Relative: 0 %
Eosinophils Absolute: 0.1 10*3/uL (ref 0.0–0.5)
Eosinophils Relative: 1 %
HCT: 26.5 % — ABNORMAL LOW (ref 36.0–46.0)
Hemoglobin: 8 g/dL — ABNORMAL LOW (ref 12.0–15.0)
Immature Granulocytes: 1 %
Lymphocytes Relative: 7 %
Lymphs Abs: 1 10*3/uL (ref 0.7–4.0)
MCH: 29.3 pg (ref 26.0–34.0)
MCHC: 30.2 g/dL (ref 30.0–36.0)
MCV: 97.1 fL (ref 80.0–100.0)
Monocytes Absolute: 0.6 10*3/uL (ref 0.1–1.0)
Monocytes Relative: 4 %
Neutro Abs: 12.3 10*3/uL — ABNORMAL HIGH (ref 1.7–7.7)
Neutrophils Relative %: 87 %
Platelets: 360 10*3/uL (ref 150–400)
RBC: 2.73 MIL/uL — ABNORMAL LOW (ref 3.87–5.11)
RDW: 19.9 % — ABNORMAL HIGH (ref 11.5–15.5)
WBC: 14.2 10*3/uL — ABNORMAL HIGH (ref 4.0–10.5)
nRBC: 0.1 % (ref 0.0–0.2)

## 2019-08-17 LAB — RETIC PANEL
Immature Retic Fract: 31.7 % — ABNORMAL HIGH (ref 2.3–15.9)
RBC.: 2.79 MIL/uL — ABNORMAL LOW (ref 3.87–5.11)
Retic Count, Absolute: 166.8 10*3/uL (ref 19.0–186.0)
Retic Ct Pct: 6 % — ABNORMAL HIGH (ref 0.4–3.1)
Reticulocyte Hemoglobin: 28.6 pg (ref >27.9)

## 2019-08-17 NOTE — Progress Notes (Signed)
Patient here for hospital follow up. Medication reconciling done with facility mar. Patient with wheezing and daughter stated due to patient having dementia she does not keep oxygen in. Patient pulse oxygen was 100.

## 2019-08-17 NOTE — Progress Notes (Signed)
Hematology/Oncology Follow Up Note [facility]  [phone] [phone]  Patient Care Team: [name], MD as PCP - General (Family Medicine)   Name of the patient: Kimberly Wang  [id number]  [birth year]   REASON FOR VISIT  follow-up for anemia  INTERVAL HISTORY 86 y.o. female presents for posthospitalization follow-up and management of anemia. Patient was admitted from 07/22/2019-08/10/2019 due to gram-negative UTI sepsis, acute on chronic diastolic CHF, anemia.  Patient has baseline dementia.  Patient was on anticoagulation with Eliquis for atrial fibrillation which was stopped due to anemia. Patient received IV iron treatments during the hospitalization. Patient is a poor historian due to dementia.  She was accompanied by daughter-in-law. Patient reports feeling well.  Per daughter, patient has been chronically wheezy.  She now has bilateral lower extremity edema which has gotten worse since her discharge.  Per daughter, she was just notified a few minutes prior to her clinical visit that patient was recommended by her cardiologist at [facility] to be admitted at the [facility] for lower extremity edema, possible cellulitis.     Review of Systems  Unable to perform ROS: Dementia  Constitutional: Negative for fatigue.      Allergies  Allergen Reactions  . Latex Hives and Rash  . Lidocaine Hcl Other (See Comments)    Other Reaction: Other reaction. Patient can't take Novacaine  . Codeine Other (See Comments)  . Morphine Other (See Comments)  . Novocain [Procaine]     Heart races  . Other Other (See Comments)    Uncoded Allergy. Allergen: RED PEPPER     Past Medical History:  Diagnosis Date  . Coronary artery disease   . Dementia (HCC)   . Hypertension   . Myocardial infarct [facility])      Past Surgical History:  Procedure Laterality Date  . CHOLECYSTECTOMY    . CORONARY STENT PLACEMENT    . ESOPHAGOGASTRODUODENOSCOPY (EGD)  WITH PROPOFOL N/A 07/31/2019   Procedure: ESOPHAGOGASTRODUODENOSCOPY (EGD) WITH PROPOFOL;  Surgeon: Toney Reil, MD;  Location: [facility] ENDOSCOPY;  Service: Gastroenterology;  Laterality: N/A;    Social History   Socioeconomic History  . Marital status: Married    Spouse name: Not on file  . Number of children: Not on file  . Years of education: Not on file  . Highest education level: Not on file  Occupational History  . Not on file  Tobacco Use  . Smoking status: Never Smoker  . Smokeless tobacco: Never Used  Vaping Use  . Vaping Use: Never used  Substance and Sexual Activity  . Alcohol use: Never  . Drug use: Never  . Sexual activity: Not on file  Other Topics Concern  . Not on file  Social History Narrative  . Not on file   Social Determinants of Health   Financial Resource Strain:   . Difficulty of Paying Living Expenses:   Food Insecurity:   . Worried About Programme researcher, broadcasting/film/video in the Last Year:   . Barista in the Last Year:   Transportation Needs:   . Freight forwarder (Medical):   Marland Kitchen Lack of Transportation (Non-Medical):   Physical Activity:   . Days of Exercise per Week:   . Minutes of Exercise per Session:   Stress:   . Feeling of Stress :   Social Connections:   . Frequency of Communication with Friends and Family:   . Frequency of Social Gatherings with Friends and Family:   .  Attends Religious Services:   . Active Member of Clubs or Organizations:   . Attends BankerClub or Organization Meetings:   Marland Kitchen. Marital Status:   Intimate Partner Violence:   . Fear of Current or Ex-Partner:   . Emotionally Abused:   Marland Kitchen. Physically Abused:   . Sexually Abused:     No family history on file.   Current Outpatient Medications:  .  atorvastatin (LIPITOR) 80 MG tablet, Take 80 mg by mouth daily. , Disp: , Rfl:  .  bumetanide (BUMEX) 1 MG tablet, Take 1 tablet (1 mg total) by mouth 2 (two) times daily., Disp: , Rfl:  .  diltiazem (CARDIZEM CD) 240 MG 24 hr  capsule, Take 240 mg by mouth daily. , Disp: , Rfl:  .  donepezil (ARICEPT) 10 MG tablet, Take 10 mg by mouth daily. , Disp: , Rfl:  .  FEROSUL 325 (65 Fe) MG tablet, Take 325 mg by mouth daily with breakfast. , Disp: , Rfl:  .  hydrOXYzine (ATARAX/VISTARIL) 25 MG tablet, Take 1 tablet (25 mg total) by mouth 2 (two) times daily as needed for anxiety., Disp: , Rfl:  .  ipratropium-albuterol (DUONEB) 0.5-2.5 (3) MG/3ML SOLN, Take 3 mLs by nebulization every 6 (six) hours as needed., Disp: 360 mL, Rfl:  .  memantine (NAMENDA) 10 MG tablet, Take 10 mg by mouth 2 (two) times daily. , Disp: , Rfl:  .  metoprolol tartrate (LOPRESSOR) 25 MG tablet, Take 0.5 tablets (12.5 mg total) by mouth 2 (two) times daily., Disp: , Rfl:  .  Multiple Vitamin (MULTIVITAMIN) capsule, Take by mouth., Disp: , Rfl:  .  nitroGLYCERIN (NITROSTAT) 0.4 MG SL tablet, Place 0.4 mg under the tongue every 5 (five) minutes as needed for chest pain. , Disp: , Rfl:  .  ondansetron (ZOFRAN) 4 MG tablet, Take 1 tablet (4 mg total) by mouth every 6 (six) hours as needed for nausea., Disp: 20 tablet, Rfl: 0 .  polyethylene glycol (MIRALAX) 17 g packet, Take 17 g by mouth daily., Disp: 14 each, Rfl: 0 .  potassium chloride (KLOR-CON) 10 MEQ tablet, Take 2 tablets (20 mEq total) by mouth 2 (two) times daily., Disp: , Rfl:  .  sertraline (ZOLOFT) 50 MG tablet, Take 50 mg by mouth in the morning and at bedtime. , Disp: , Rfl:  .  spironolactone (ALDACTONE) 25 MG tablet, Take 25 mg by mouth daily. , Disp: , Rfl:  .  traZODone (DESYREL) 50 MG tablet, Take 0.5 tablets (25 mg total) by mouth at bedtime as needed for sleep., Disp: , Rfl:   Physical exam: There were no vitals filed for this visit. Physical Exam HENT:     Head: Normocephalic and atraumatic.  Pulmonary:     Effort: Pulmonary effort is normal. No respiratory distress.     Comments: Audible wheezing Musculoskeletal:        General: Swelling present.     Cervical back: Normal  range of motion and neck supple.     Comments: Bilateral lower extremity severe edema, oozing fluid.  Skin:    General: Skin is warm.  Neurological:     Mental Status: She is alert.     Comments: Pleasantly confused Orientated x1     CMP Latest Ref Rng & Units 08/10/2019  Glucose 70 - 99 mg/dL -  BUN 8 - 23 mg/dL -  Creatinine 1.610.44 - 0.961.00 mg/dL -  Sodium 045135 - 409145 mmol/L -  Potassium 3.5 - 5.1 mmol/L 3.1(L)  Chloride 98 - 111 mmol/L -  CO2 22 - 32 mmol/L -  Calcium 8.9 - 10.3 mg/dL -  Total Protein 6.5 - 8.1 g/dL -  Total Bilirubin 0.3 - 1.2 mg/dL -  Alkaline Phos 38 - 782 U/L -  AST 15 - 41 U/L -  ALT 0 - 44 U/L -   CBC Latest Ref Rng & Units 08/10/2019  WBC 4.0 - 10.5 K/uL 8.7  Hemoglobin 12.0 - 15.0 g/dL 9.5(A)  Hematocrit 36 - 46 % 26.4(L)  Platelets 150 - 400 K/uL 298    RADIOGRAPHIC STUDIES: I have personally reviewed the radiological images as listed and agreed with the findings in the report. CT ABDOMEN PELVIS WO CONTRAST  Result Date: 07/22/2019 CLINICAL DATA:  Sepsis EXAM: CT ABDOMEN AND PELVIS WITHOUT CONTRAST TECHNIQUE: Multidetector CT imaging of the abdomen and pelvis was performed following the standard protocol without IV contrast. COMPARISON:  None. FINDINGS: Lower chest: There is mild cardiomegaly. Mitral valve calcifications. A moderate paraesophageal hernia containing contrast is seen. Hepatobiliary: Although limited due to the lack of intravenous contrast, normal in appearance without gross focal abnormality. The patient is status post cholecystectomy. No biliary ductal dilation. Pancreas:  Unremarkable.  No surrounding inflammatory changes. Spleen: Normal in size. Although limited due to the lack of intravenous contrast, normal in appearance. Adrenals/Urinary Tract: Both adrenal glands appear normal. The kidneys and collecting system appear normal without evidence of urinary tract calculus or hydronephrosis. Bladder is unremarkable. Stomach/Bowel: The stomach,  small bowel, are normal in appearance. There is scattered colonic diverticula without diverticulitis. Vascular/Lymphatic: There are no enlarged abdominal or pelvic lymph nodes. Scattered aortic atherosclerotic calcifications are seen without aneurysmal dilatation. Reproductive: The uterus and adnexa are unremarkable. Other: A small fat and transverse colon containing umbilical hernia seen within the mid abdomen. Musculoskeletal: No acute or significant osseous findings. IMPRESSION: Moderate paraesophageal hernia. Diverticulosis without diverticulitis. No other acute intra-abdominal or pelvic pathology to explain the patient's symptoms. Aortic Atherosclerosis (ICD10-I70.0). Electronically Signed   By: Jonna Clark M.D.   On: 07/22/2019 01:40   DG Chest 1 View  Result Date: 07/27/2019 CLINICAL DATA:  Shortness of breath EXAM: CHEST  1 VIEW COMPARISON:  July 26, 2019 FINDINGS: There is unchanged mild cardiomegaly. There is aortic knob and descending intrathoracic aortic calcifications. There is slight interval improvement in the prominence of the central pulmonary vasculature. No acute osseous abnormality. IMPRESSION: Slight interval improvement in the pulmonary vascular congestion. Electronically Signed   By: Jonna Clark M.D.   On: 07/27/2019 06:04   DG Chest 1 View  Result Date: 07/26/2019 CLINICAL DATA:  Shortness of breath. EXAM: CHEST  1 VIEW COMPARISON:  07/23/2019. FINDINGS: Mediastinum hilar structures normal. Cardiomegaly. Mild bilateral interstitial prominence and small pleural effusions. CHF could present in this fashion. Sliding hiatal hernia cannot be excluded. IMPRESSION: Cardiomegaly. Mild bilateral interstitial prominence and small pleural effusions. CHF could present in this fashion. Electronically Signed   By: Maisie Fus  Register   On: 07/26/2019 07:52   DG Chest 1 View  Result Date: 07/23/2019 CLINICAL DATA:  Dyspnea EXAM: CHEST  1 VIEW COMPARISON:  Chest radiograph from one day prior.  FINDINGS: Stable cardiomediastinal silhouette with mild cardiomegaly. No pneumothorax. Stable small left pleural effusion. No right pleural effusion. No overt pulmonary edema. Streaky bibasilar lung opacities are similar IMPRESSION: Stable small left pleural effusion. Stable streaky bibasilar lung opacities, favor atelectasis or scarring. Stable mild cardiomegaly without overt pulmonary edema. Electronically Signed   By: Jannifer Rodney.D.  On: 07/23/2019 04:50   DG Chest 1 View  Result Date: 07/21/2019 CLINICAL DATA:  Sepsis. EXAM: CHEST  1 VIEW COMPARISON:  None. FINDINGS: There are small bilateral pleural effusions. There is a left perihilar density of unknown clinical significance. The heart size is mildly enlarged. There are dense aortic calcifications. There are few streaky airspace opacities at the lung bases favored to represent areas of atelectasis. There is no pneumothorax. IMPRESSION: 1. Small bilateral pleural effusions. 2. Mild cardiomegaly. 3. Slightly prominent left perihilar region may be secondary to a dilated pulmonary artery or lymphadenopathy. A follow-up two-view chest x-ray is recommended in 4-6 weeks. Electronically Signed   By: Katherine Mantle M.D.   On: 07/21/2019 22:34   DG Chest Port 1 View  Result Date: 08/07/2019 CLINICAL DATA:  Dyspnea EXAM: PORTABLE CHEST 1 VIEW COMPARISON:  08/03/2019 FINDINGS: Cardiac shadow is enlarged but stable. Aortic calcifications are noted. Large hiatal hernia is again noted and stable. The lungs are well aerated. Small effusions are again noted and stable. Mild right basilar atelectasis is seen. IMPRESSION: Small effusions and right basilar atelectasis. Electronically Signed   By: Alcide Clever M.D.   On: 08/07/2019 13:40   DG Chest Port 1 View  Result Date: 08/03/2019 CLINICAL DATA:  Cough EXAM: PORTABLE CHEST 1 VIEW COMPARISON:  07/27/2019 FINDINGS: Cardiac shadow remains enlarged. Aortic calcifications are again seen. Small bilateral pleural  effusions are noted stable from the prior exam. No focal infiltrate is noted. No acute bony abnormality is seen. IMPRESSION: Small effusions bilaterally.  No acute abnormality noted. Electronically Signed   By: Alcide Clever M.D.   On: 08/03/2019 17:17   DG Chest Port 1 View  Result Date: 07/22/2019 CLINICAL DATA:  Shortness of breath. EXAM: PORTABLE CHEST 1 VIEW COMPARISON:  Jul 21, 2019 FINDINGS: Cardiomegaly. Skin fold over the lateral right chest. The hila and mediastinum are normal. No pneumothorax. No nodules or masses. No focal infiltrates. IMPRESSION: No active disease. Electronically Signed   By: Gerome Sam III M.D   On: 07/22/2019 15:12   ECHOCARDIOGRAM COMPLETE  Result Date: 07/27/2019    ECHOCARDIOGRAM REPORT   Patient Name:   KHYA HALLS Date of Exam: 07/26/2019 Medical Rec #:  [id number]   Height:       64.0 in Accession #:    [id number]  Weight:       164.2 lb Date of Birth:  [birth year]    BSA:          1.799 m Patient Age:    88 years    BP:           117/65 mmHg Patient Gender: F           HR:           124 bpm. Exam Location:  [facility] Procedure: 2D Echo, Cardiac Doppler and Color Doppler Indications:     Dyspnea 786.09 / R06.00  History:         Patient has no prior history of Echocardiogram examinations.                  CAD; Risk Factors:Hypertension. MI.  Sonographer:     Neysa Bonito Roar Referring Phys:  [id number] Kateri Mc LATIF [facility] Diagnosing Phys: Lorine Bears MD IMPRESSIONS  1. Left ventricular ejection fraction, by estimation, is 40 to 45%. The left ventricle has mildly decreased function. The left ventricle demonstrates regional wall motion abnormalities with possible anterior/anteroseptal hypokinesis. There is mild left ventricular hypertrophy. Left ventricular diastolic parameters  are indeterminate.  2. Right ventricular systolic function is normal. The right ventricular size is normal. There is mildly elevated pulmonary artery systolic pressure.  3. Left atrial size was severely  dilated.  4. Right atrial size was moderately dilated.  5. The mitral valve is abnormal. Moderate mitral valve regurgitation. No evidence of mitral stenosis.  6. The aortic valve is abnormal. Aortic valve regurgitation is moderate. Mild aortic valve sclerosis is present, with no evidence of aortic valve stenosis. FINDINGS  Left Ventricle: Left ventricular ejection fraction, by estimation, is 40 to 45%. The left ventricle has mildly decreased function. The left ventricle demonstrates regional wall motion abnormalities. The left ventricular internal cavity size was normal in size. There is mild left ventricular hypertrophy. Left ventricular diastolic parameters are indeterminate. Right Ventricle: The right ventricular size is normal. No increase in right ventricular wall thickness. Right ventricular systolic function is normal. There is mildly elevated pulmonary artery systolic pressure. The tricuspid regurgitant velocity is 2.69  m/s, and with an assumed right atrial pressure of 10 mmHg, the estimated right ventricular systolic pressure is 02.5 mmHg. Left Atrium: Left atrial size was severely dilated. Right Atrium: Right atrial size was moderately dilated. Pericardium: There is no evidence of pericardial effusion. Mitral Valve: The mitral valve is abnormal. There is moderate thickening of the mitral valve leaflet(s). There is mild calcification of the mitral valve leaflet(s). Normal mobility of the mitral valve leaflets. Moderate mitral annular calcification. Moderate mitral valve regurgitation. No evidence of mitral valve stenosis. Tricuspid Valve: The tricuspid valve is normal in structure. Tricuspid valve regurgitation is not demonstrated. No evidence of tricuspid stenosis. Aortic Valve: The aortic valve is abnormal. Aortic valve regurgitation is moderate. Mild aortic valve sclerosis is present, with no evidence of aortic valve stenosis. Aortic valve mean gradient measures 7.5 mmHg. Aortic valve peak gradient  measures 14.1 mmHg. Aortic valve area, by VTI measures 1.73 cm. Pulmonic Valve: The pulmonic valve was normal in structure. Pulmonic valve regurgitation is not visualized. No evidence of pulmonic stenosis. Aorta: The aortic root is normal in size and structure. Venous: The inferior vena cava was not well visualized. IAS/Shunts: No atrial level shunt detected by color flow Doppler.  LEFT VENTRICLE PLAX 2D LVIDd:         4.47 cm  Diastology LVIDs:         3.12 cm  LV e' lateral:   6.64 cm/s LV PW:         1.14 cm  LV E/e' lateral: 23.8 LV IVS:        1.06 cm  LV e' medial:    8.81 cm/s LVOT diam:     1.70 cm  LV E/e' medial:  17.9 LV SV:         52 LV SV Index:   29 LVOT Area:     2.27 cm  RIGHT VENTRICLE RV Mid diam:    2.29 cm RV S prime:     12.70 cm/s TAPSE (M-mode): 1.6 cm LEFT ATRIUM             Index       RIGHT ATRIUM           Index LA diam:        4.20 cm 2.33 cm/m  RA Area:     19.00 cm LA Vol (A2C):   92.9 ml 51.64 ml/m RA Volume:   44.60 ml  24.79 ml/m LA Vol (A4C):   82.8 ml 46.02 ml/m LA Biplane Vol: 91.4  ml 50.80 ml/m  AORTIC VALVE                    PULMONIC VALVE AV Area (Vmax):    1.54 cm     PV Vmax:        1.10 m/s AV Area (Vmean):   1.56 cm     PV Peak grad:   4.8 mmHg AV Area (VTI):     1.73 cm     RVOT Peak grad: 2 mmHg AV Vmax:           187.50 cm/s AV Vmean:          123.000 cm/s AV VTI:            0.301 m AV Peak Grad:      14.1 mmHg AV Mean Grad:      7.5 mmHg LVOT Vmax:         127.00 cm/s LVOT Vmean:        84.500 cm/s LVOT VTI:          0.229 m LVOT/AV VTI ratio: 0.76  AORTA Ao Root diam: 2.20 cm MITRAL VALVE                TRICUSPID VALVE MV Area (PHT): 3.16 cm     TR Peak grad:   28.9 mmHg MV Decel Time: 240 msec     TR Vmax:        269.00 cm/s MV E velocity: 158.00 cm/s                             SHUNTS                             Systemic VTI:  0.23 m                             Systemic Diam: 1.70 cm Lorine Bears MD Electronically signed by Lorine Bears MD Signature  Date/Time: 07/27/2019/8:13:34 AM    Final      Assessment and plan Patient is a 86 y.o. female with past medical history including dementia, atrial fibrillation, anemia, CHF, hypertension, presents for follow-up of management of anemia. 1. Iron deficiency anemia due to chronic blood loss   2. Bilateral lower extremity edema   3. Wheezing    Labs are reviewed and discussed with patient.  Hemoglobin further decreased to 8. Etiology includes possible underlying iron deficiency anemia, dilutional effect from CHF exacerbation. Today patient has audible wheezing bilaterally.  Bilateral lower extremity swelling has significantly increased and is leaking fluid.  Focal erythema skin changes.  Patient also seems to have a leukocytosis. Patient will be admitted to [facility] by her cardiologist for further management, possible CHF exacerbation, lower extremity cellulitis/edema.  I discussed with patient that even though I think patient will further benefit from IV Venofer treatments I will hold off today given the acute issue.  Goals of care were discussed patient.  Given patient's advanced age, multiple comorbidities, patient does have multiple competing cause of death at this point.  I suggest patient's family to further discuss about goals of care and CODE STATUS.  Depending on how she does for [facility] admission, she may or may not need to come back for treatments for anemia.  Daughter-in-law will call if patient recovers to a stable condition.    Janyth Contes  Cathie Hoops, MD, PhD Hematology Oncology [facility] at [facility] Pager- [phone] 08/17/2019

## 2019-08-20 ENCOUNTER — Ambulatory Visit: Payer: Self-pay | Admitting: Podiatry

## 2019-12-24 DEATH — deceased

## 2021-02-03 IMAGING — CT CT ABD-PELV W/O CM
2 of 4 series · 16 of 46 positions shown, 18 images · non-contrast
Comparison: None.

CLINICAL DATA: Sepsis

EXAM:
CT ABDOMEN AND PELVIS WITHOUT CONTRAST
TECHNIQUE: Multidetector CT imaging of the abdomen and pelvis was performed
following the standard protocol without IV contrast.

[Series 2: routine abd/pel wo · axial · 0.75mm/px · z∈[-726,-331]mm · 13 of 87 slices shown, 15 images]
[im 4/87  soft-tissue]
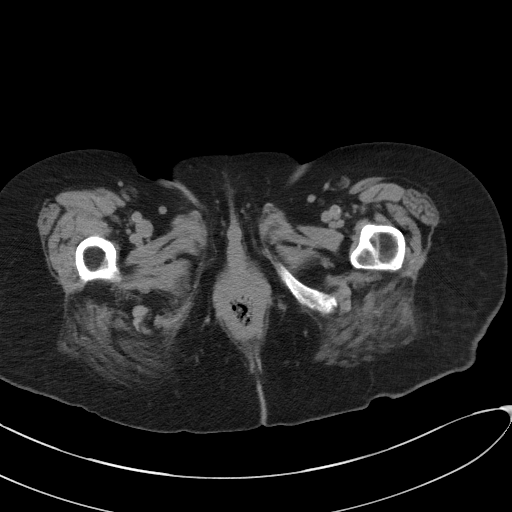
[im 4/87  bone]
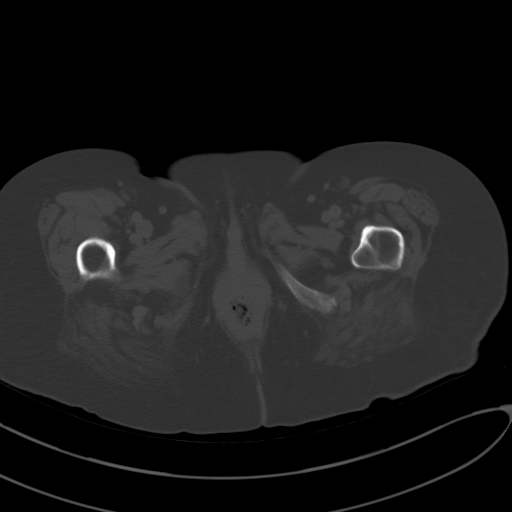
[im 12/87  soft-tissue]
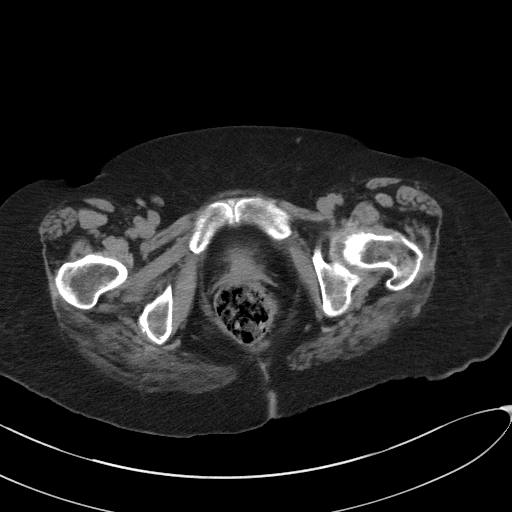
[im 19/87  soft-tissue]
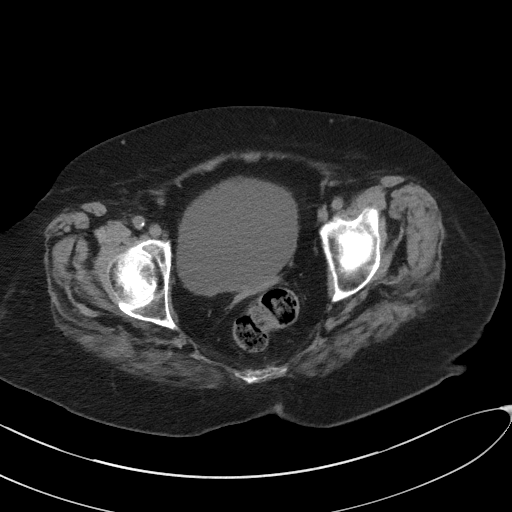
[im 23/87  soft-tissue]
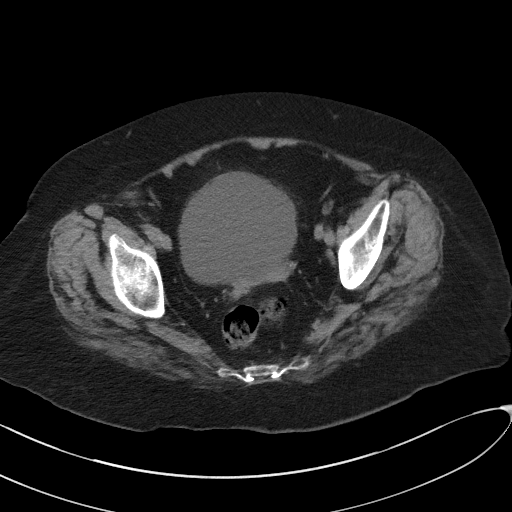
[im 30/87  soft-tissue]
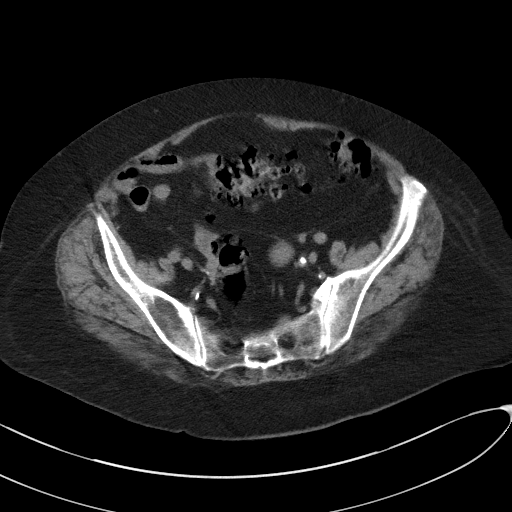
[im 38/87  soft-tissue]
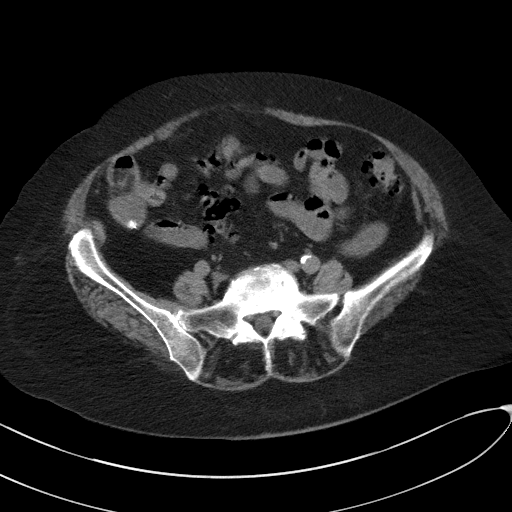
[im 45/87  soft-tissue]
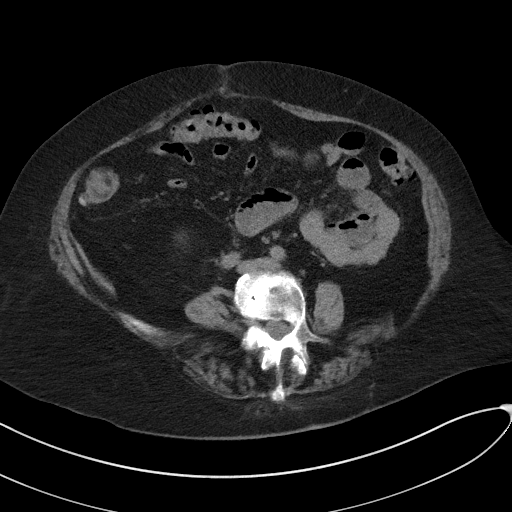
[im 49/87  soft-tissue]
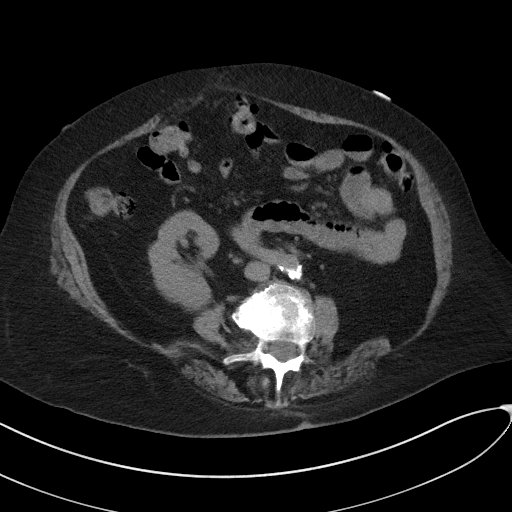
[im 57/87  soft-tissue]
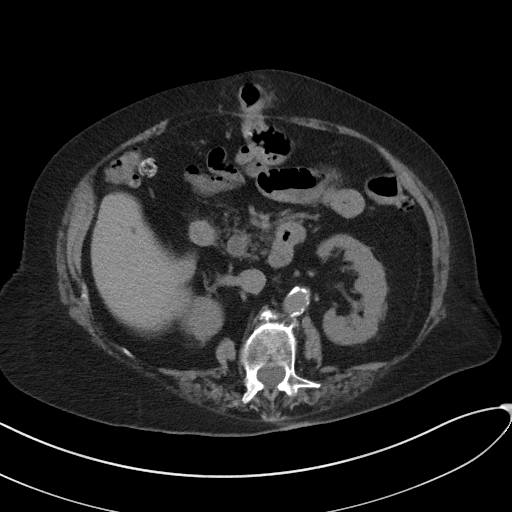
[im 57/87  bone]
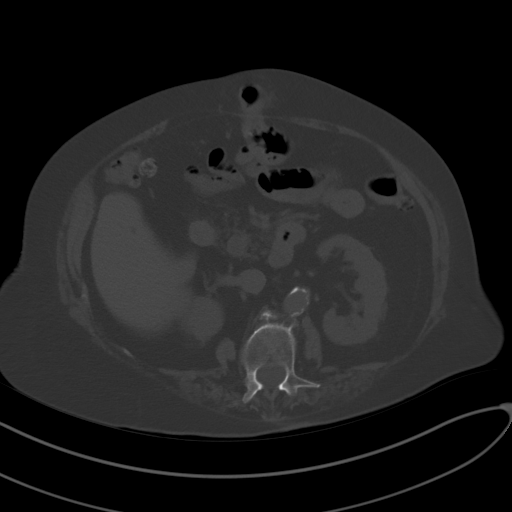
[im 64/87  soft-tissue]
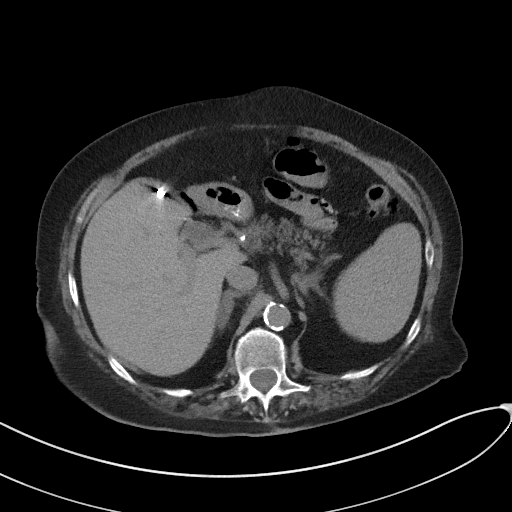
[im 68/87  soft-tissue]
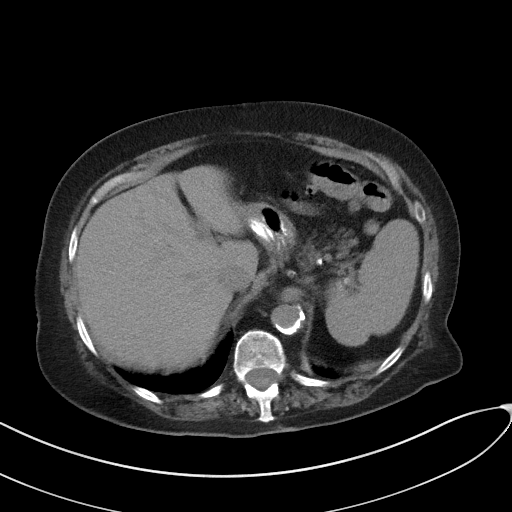
[im 75/87  soft-tissue]
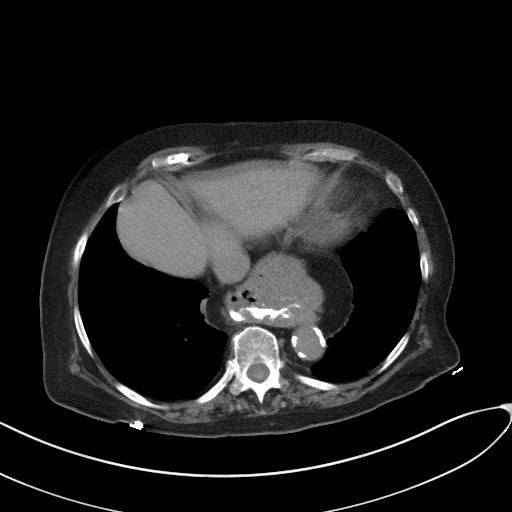
[im 83/87  soft-tissue]
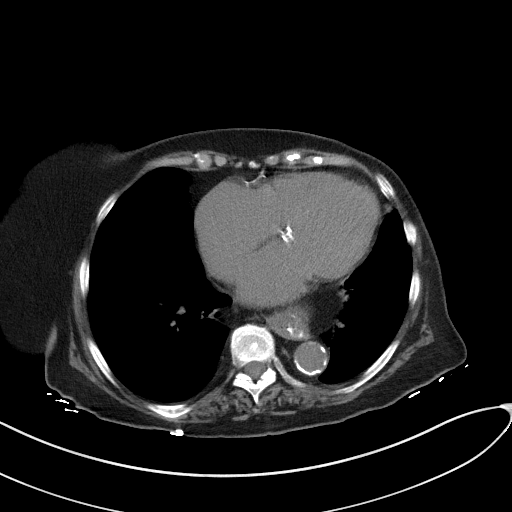

[Series 5: coronal st · coronal · 0.76mm/px · 3 of 93 slices shown]
[im 31/93  soft-tissue]
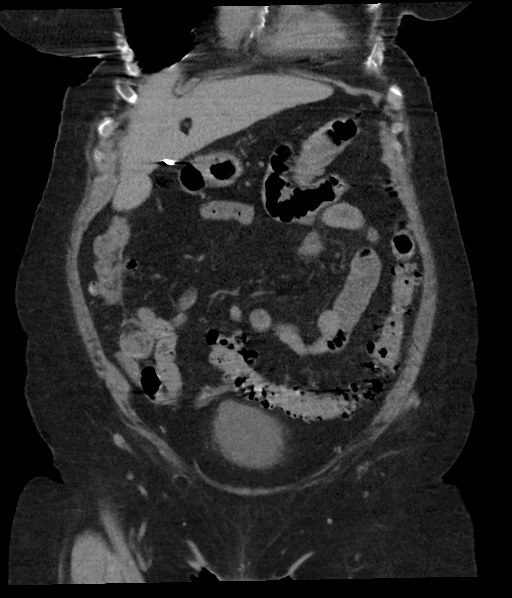
[im 41/93  soft-tissue]
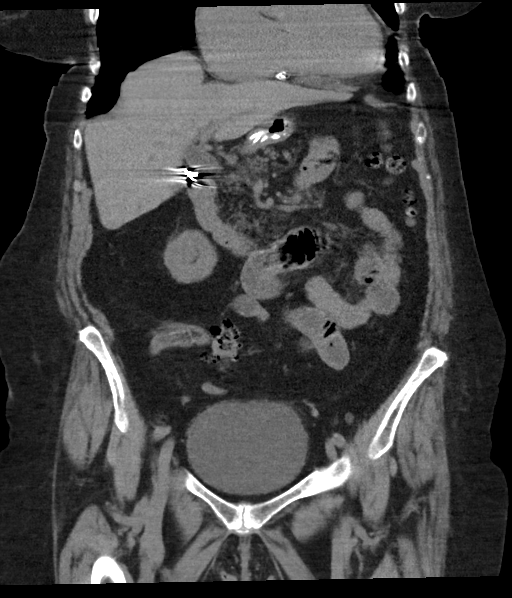
[im 52/93  soft-tissue]
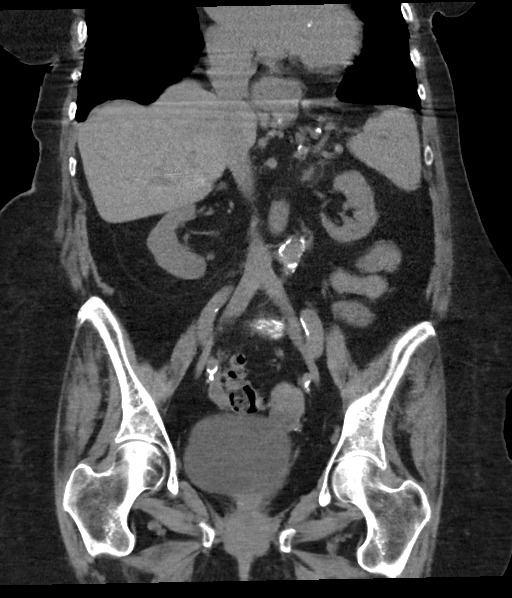

[16 of 46 positions shown; findings below may reference images not displayed]

FINDINGS: Lower chest: There is mild cardiomegaly. Mitral valve
calcifications. A moderate paraesophageal hernia containing contrast
is seen.

Hepatobiliary: Although limited due to the lack of intravenous
contrast, normal in appearance without gross focal abnormality. The
patient is status post cholecystectomy. No biliary ductal dilation.

Pancreas:  Unremarkable.  No surrounding inflammatory changes.

Spleen: Normal in size. Although limited due to the lack of
intravenous contrast, normal in appearance.

Adrenals/Urinary Tract: Both adrenal glands appear normal. The
kidneys and collecting system appear normal without evidence of
urinary tract calculus or hydronephrosis. Bladder is unremarkable.

Stomach/Bowel: The stomach, small bowel, are normal in appearance.
There is scattered colonic diverticula without diverticulitis.

Vascular/Lymphatic: There are no enlarged abdominal or pelvic lymph
nodes. Scattered aortic atherosclerotic calcifications are seen
without aneurysmal dilatation.

Reproductive: The uterus and adnexa are unremarkable.

Other: A small fat and transverse colon containing umbilical hernia
seen within the mid abdomen.

Musculoskeletal: No acute or significant osseous findings.
IMPRESSION: Moderate paraesophageal hernia.

Diverticulosis without diverticulitis.

No other acute intra-abdominal or pelvic pathology to explain the
patient's symptoms.

Aortic Atherosclerosis ([id number]).

## 2021-02-19 IMAGING — DX DG CHEST 1V PORT
1 series · 1 of 1 positions shown · non-contrast
Comparison: 08/03/2019

CLINICAL DATA: Dyspnea

EXAM:
PORTABLE CHEST 1 VIEW

[chest ap]
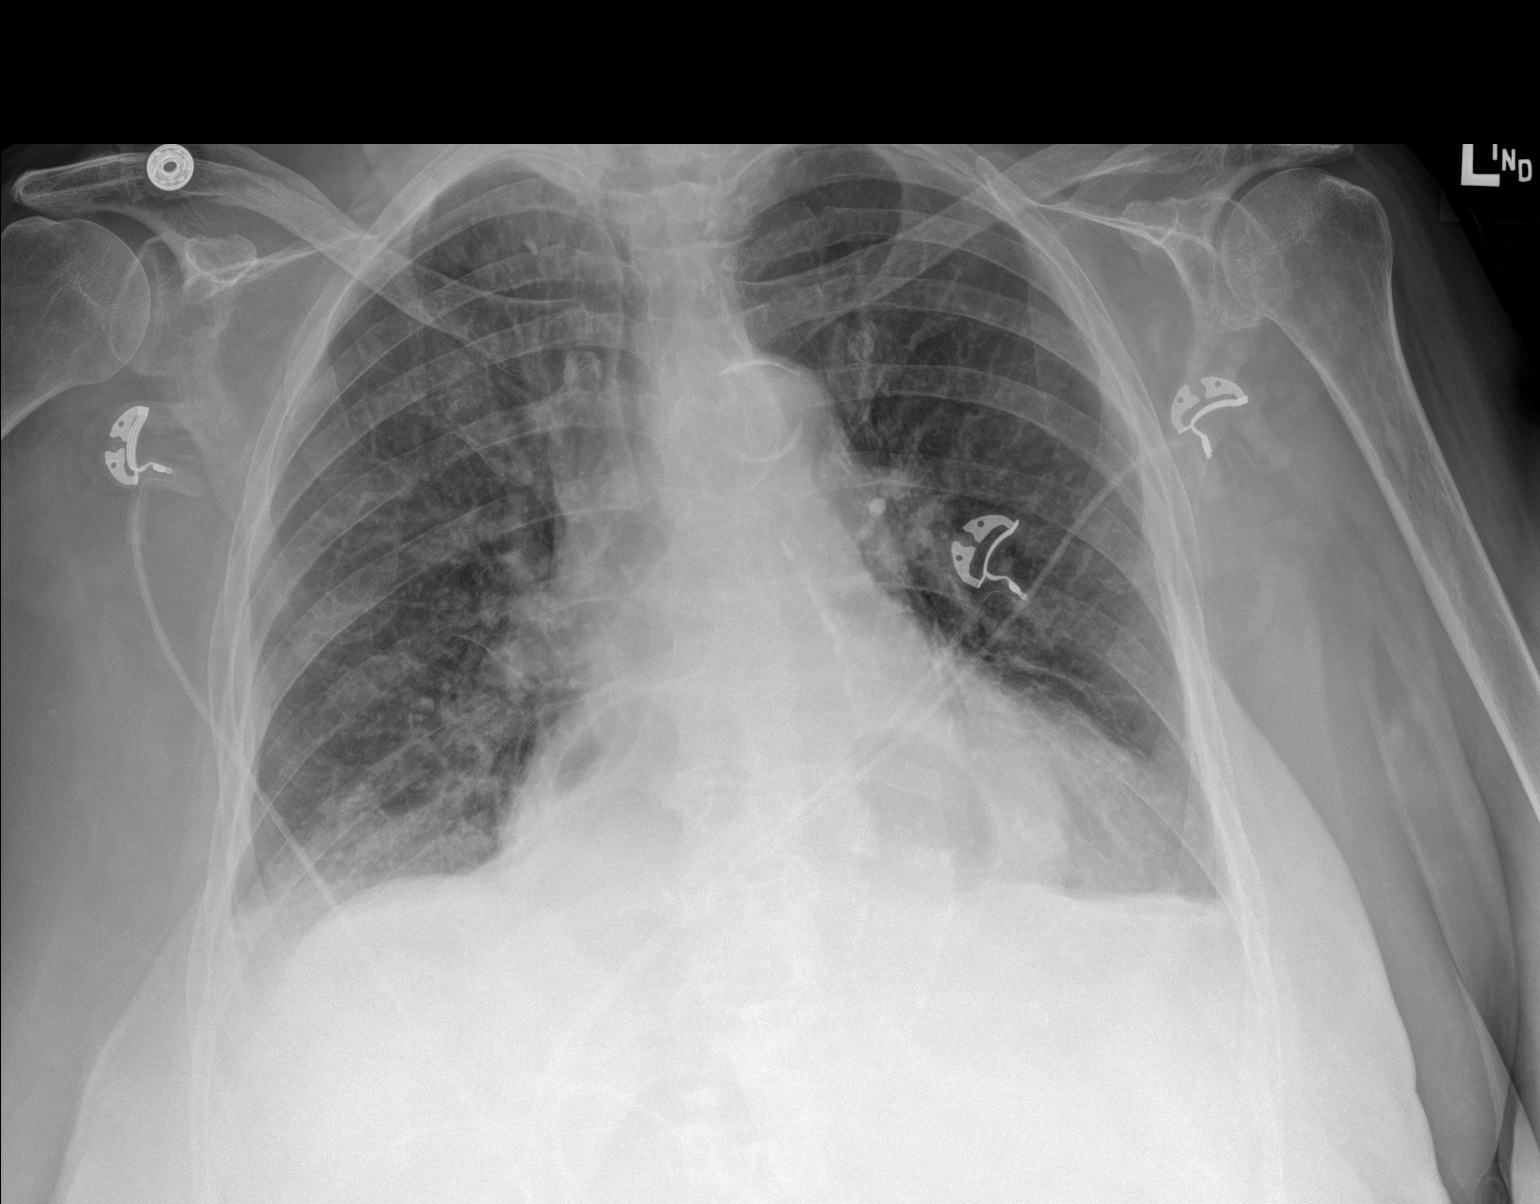

[1 of 1 positions shown; findings below may reference images not displayed]

FINDINGS: Cardiac shadow is enlarged but stable. Aortic calcifications are
noted. Large hiatal hernia is again noted and stable. The lungs are
well aerated. Small effusions are again noted and stable. Mild right
basilar atelectasis is seen.
IMPRESSION: Small effusions and right basilar atelectasis.
